# Patient Record
Sex: Female | Born: 1955 | Race: White | Hispanic: No | Marital: Single | Smoking: Former smoker
Health system: Southern US, Community
[De-identification: ages and names within clinical notes are randomized; demographics above are authoritative.]

## PROBLEM LIST (undated history)

## (undated) DIAGNOSIS — T8859XA Other complications of anesthesia, initial encounter: Secondary | ICD-10-CM

## (undated) DIAGNOSIS — G709 Myoneural disorder, unspecified: Secondary | ICD-10-CM

## (undated) DIAGNOSIS — D649 Anemia, unspecified: Secondary | ICD-10-CM

## (undated) DIAGNOSIS — I10 Essential (primary) hypertension: Secondary | ICD-10-CM

## (undated) DIAGNOSIS — Z973 Presence of spectacles and contact lenses: Secondary | ICD-10-CM

## (undated) DIAGNOSIS — N189 Chronic kidney disease, unspecified: Secondary | ICD-10-CM

## (undated) DIAGNOSIS — R350 Frequency of micturition: Secondary | ICD-10-CM

## (undated) DIAGNOSIS — Z8719 Personal history of other diseases of the digestive system: Secondary | ICD-10-CM

## (undated) DIAGNOSIS — N390 Urinary tract infection, site not specified: Secondary | ICD-10-CM

## (undated) DIAGNOSIS — M48061 Spinal stenosis, lumbar region without neurogenic claudication: Secondary | ICD-10-CM

## (undated) DIAGNOSIS — G35 Multiple sclerosis: Secondary | ICD-10-CM

## (undated) DIAGNOSIS — J45909 Unspecified asthma, uncomplicated: Secondary | ICD-10-CM

## (undated) DIAGNOSIS — K219 Gastro-esophageal reflux disease without esophagitis: Secondary | ICD-10-CM

## (undated) DIAGNOSIS — M4306 Spondylolysis, lumbar region: Secondary | ICD-10-CM

## (undated) DIAGNOSIS — M1990 Unspecified osteoarthritis, unspecified site: Secondary | ICD-10-CM

## (undated) DIAGNOSIS — J849 Interstitial pulmonary disease, unspecified: Secondary | ICD-10-CM

## (undated) DIAGNOSIS — F419 Anxiety disorder, unspecified: Secondary | ICD-10-CM

## (undated) DIAGNOSIS — R011 Cardiac murmur, unspecified: Secondary | ICD-10-CM

## (undated) DIAGNOSIS — R51 Headache: Secondary | ICD-10-CM

## (undated) DIAGNOSIS — G35D Multiple sclerosis, unspecified: Secondary | ICD-10-CM

## (undated) DIAGNOSIS — T4145XA Adverse effect of unspecified anesthetic, initial encounter: Secondary | ICD-10-CM

## (undated) HISTORY — PX: CHOLECYSTECTOMY: SHX55

## (undated) HISTORY — PX: TONSILLECTOMY: SUR1361

## (undated) HISTORY — DX: Multiple sclerosis, unspecified: G35.D

## (undated) HISTORY — PX: EYE SURGERY: SHX253

## (undated) HISTORY — PX: COLONOSCOPY: SHX174

## (undated) HISTORY — DX: Multiple sclerosis: G35

## (undated) HISTORY — PX: UTERINE FIBROID EMBOLIZATION: SHX825

---

## 1997-08-24 ENCOUNTER — Ambulatory Visit (HOSPITAL_COMMUNITY): Admission: RE | Admit: 1997-08-24 | Discharge: 1997-08-24 | Payer: Self-pay | Admitting: *Deleted

## 1998-01-29 ENCOUNTER — Ambulatory Visit (HOSPITAL_COMMUNITY): Admission: RE | Admit: 1998-01-29 | Discharge: 1998-01-29 | Payer: Self-pay | Admitting: *Deleted

## 1998-10-24 ENCOUNTER — Ambulatory Visit (HOSPITAL_COMMUNITY): Admission: RE | Admit: 1998-10-24 | Discharge: 1998-10-24 | Payer: Self-pay | Admitting: *Deleted

## 1999-01-03 ENCOUNTER — Encounter (INDEPENDENT_AMBULATORY_CARE_PROVIDER_SITE_OTHER): Payer: Self-pay

## 1999-01-03 ENCOUNTER — Other Ambulatory Visit: Admission: RE | Admit: 1999-01-03 | Discharge: 1999-01-03 | Payer: Self-pay | Admitting: Obstetrics and Gynecology

## 1999-02-08 ENCOUNTER — Ambulatory Visit (HOSPITAL_COMMUNITY): Admission: RE | Admit: 1999-02-08 | Discharge: 1999-02-08 | Payer: Self-pay | Admitting: *Deleted

## 1999-03-13 ENCOUNTER — Encounter: Admission: RE | Admit: 1999-03-13 | Discharge: 1999-03-13 | Payer: Self-pay | Admitting: *Deleted

## 1999-04-11 ENCOUNTER — Encounter: Admission: RE | Admit: 1999-04-11 | Discharge: 1999-04-11 | Payer: Self-pay | Admitting: *Deleted

## 1999-05-30 ENCOUNTER — Ambulatory Visit (HOSPITAL_COMMUNITY): Admission: RE | Admit: 1999-05-30 | Discharge: 1999-05-30 | Payer: Self-pay | Admitting: Obstetrics and Gynecology

## 1999-05-30 ENCOUNTER — Encounter: Payer: Self-pay | Admitting: Obstetrics and Gynecology

## 1999-06-05 ENCOUNTER — Encounter: Admission: RE | Admit: 1999-06-05 | Discharge: 1999-09-03 | Payer: Self-pay | Admitting: *Deleted

## 1999-06-25 ENCOUNTER — Ambulatory Visit (HOSPITAL_COMMUNITY): Admission: RE | Admit: 1999-06-25 | Discharge: 1999-06-25 | Payer: Self-pay | Admitting: Interventional Radiology

## 1999-06-27 ENCOUNTER — Encounter: Payer: Self-pay | Admitting: Obstetrics and Gynecology

## 1999-06-27 ENCOUNTER — Observation Stay (HOSPITAL_COMMUNITY): Admission: AD | Admit: 1999-06-27 | Discharge: 1999-06-28 | Payer: Self-pay | Admitting: Obstetrics and Gynecology

## 1999-10-07 ENCOUNTER — Encounter: Admission: RE | Admit: 1999-10-07 | Discharge: 2000-01-05 | Payer: Self-pay | Admitting: Internal Medicine

## 2000-01-02 ENCOUNTER — Ambulatory Visit (HOSPITAL_COMMUNITY): Admission: RE | Admit: 2000-01-02 | Discharge: 2000-01-02 | Payer: Self-pay | Admitting: Interventional Radiology

## 2000-02-12 ENCOUNTER — Encounter: Payer: Self-pay | Admitting: Obstetrics and Gynecology

## 2000-02-12 ENCOUNTER — Ambulatory Visit (HOSPITAL_COMMUNITY): Admission: RE | Admit: 2000-02-12 | Discharge: 2000-02-12 | Payer: Self-pay | Admitting: Obstetrics and Gynecology

## 2000-05-12 ENCOUNTER — Ambulatory Visit (HOSPITAL_COMMUNITY): Admission: RE | Admit: 2000-05-12 | Discharge: 2000-05-12 | Payer: Self-pay | Admitting: Gastroenterology

## 2000-05-12 ENCOUNTER — Encounter (INDEPENDENT_AMBULATORY_CARE_PROVIDER_SITE_OTHER): Payer: Self-pay | Admitting: *Deleted

## 2000-05-26 ENCOUNTER — Encounter: Admission: RE | Admit: 2000-05-26 | Discharge: 2000-05-26 | Payer: Self-pay | Admitting: Gastroenterology

## 2000-05-26 ENCOUNTER — Encounter: Payer: Self-pay | Admitting: Gastroenterology

## 2001-01-11 ENCOUNTER — Other Ambulatory Visit: Admission: RE | Admit: 2001-01-11 | Discharge: 2001-01-11 | Payer: Self-pay | Admitting: Obstetrics and Gynecology

## 2001-01-20 ENCOUNTER — Encounter: Payer: Self-pay | Admitting: Obstetrics and Gynecology

## 2001-01-20 ENCOUNTER — Ambulatory Visit (HOSPITAL_COMMUNITY): Admission: RE | Admit: 2001-01-20 | Discharge: 2001-01-20 | Payer: Self-pay | Admitting: Obstetrics and Gynecology

## 2001-03-09 ENCOUNTER — Encounter: Payer: Self-pay | Admitting: Obstetrics and Gynecology

## 2001-03-09 ENCOUNTER — Ambulatory Visit (HOSPITAL_COMMUNITY): Admission: RE | Admit: 2001-03-09 | Discharge: 2001-03-09 | Payer: Self-pay | Admitting: Obstetrics and Gynecology

## 2001-04-20 ENCOUNTER — Encounter: Admission: RE | Admit: 2001-04-20 | Discharge: 2001-04-20 | Payer: Self-pay | Admitting: Obstetrics and Gynecology

## 2001-05-03 ENCOUNTER — Encounter: Admission: RE | Admit: 2001-05-03 | Discharge: 2001-05-03 | Payer: Self-pay | Admitting: Obstetrics and Gynecology

## 2001-05-03 ENCOUNTER — Encounter: Payer: Self-pay | Admitting: Obstetrics and Gynecology

## 2001-06-23 ENCOUNTER — Encounter: Admission: RE | Admit: 2001-06-23 | Discharge: 2001-06-23 | Payer: Self-pay | Admitting: Obstetrics and Gynecology

## 2001-06-23 ENCOUNTER — Encounter: Payer: Self-pay | Admitting: Obstetrics and Gynecology

## 2002-01-12 ENCOUNTER — Encounter (INDEPENDENT_AMBULATORY_CARE_PROVIDER_SITE_OTHER): Payer: Self-pay

## 2002-01-12 ENCOUNTER — Ambulatory Visit (HOSPITAL_COMMUNITY): Admission: RE | Admit: 2002-01-12 | Discharge: 2002-01-12 | Payer: Self-pay | Admitting: Obstetrics and Gynecology

## 2002-03-15 ENCOUNTER — Ambulatory Visit (HOSPITAL_COMMUNITY): Admission: RE | Admit: 2002-03-15 | Discharge: 2002-03-15 | Payer: Self-pay | Admitting: Obstetrics and Gynecology

## 2002-03-15 ENCOUNTER — Other Ambulatory Visit: Admission: RE | Admit: 2002-03-15 | Discharge: 2002-03-15 | Payer: Self-pay | Admitting: Obstetrics and Gynecology

## 2002-03-15 ENCOUNTER — Encounter: Payer: Self-pay | Admitting: Obstetrics and Gynecology

## 2002-04-15 ENCOUNTER — Encounter: Payer: Self-pay | Admitting: General Surgery

## 2002-04-15 ENCOUNTER — Ambulatory Visit (HOSPITAL_COMMUNITY): Admission: RE | Admit: 2002-04-15 | Discharge: 2002-04-15 | Payer: Self-pay | Admitting: General Surgery

## 2002-06-13 ENCOUNTER — Encounter: Payer: Self-pay | Admitting: General Surgery

## 2002-06-13 ENCOUNTER — Observation Stay (HOSPITAL_COMMUNITY): Admission: RE | Admit: 2002-06-13 | Discharge: 2002-06-14 | Payer: Self-pay | Admitting: General Surgery

## 2002-06-13 ENCOUNTER — Encounter (INDEPENDENT_AMBULATORY_CARE_PROVIDER_SITE_OTHER): Payer: Self-pay | Admitting: Specialist

## 2002-06-19 ENCOUNTER — Emergency Department (HOSPITAL_COMMUNITY): Admission: EM | Admit: 2002-06-19 | Discharge: 2002-06-19 | Payer: Self-pay | Admitting: Emergency Medicine

## 2003-01-27 ENCOUNTER — Ambulatory Visit (HOSPITAL_COMMUNITY): Admission: RE | Admit: 2003-01-27 | Discharge: 2003-01-27 | Payer: Self-pay | Admitting: Internal Medicine

## 2003-02-07 ENCOUNTER — Encounter: Admission: RE | Admit: 2003-02-07 | Discharge: 2003-02-07 | Payer: Self-pay | Admitting: Internal Medicine

## 2003-04-27 ENCOUNTER — Other Ambulatory Visit: Admission: RE | Admit: 2003-04-27 | Discharge: 2003-04-27 | Payer: Self-pay | Admitting: Obstetrics and Gynecology

## 2003-05-03 ENCOUNTER — Ambulatory Visit (HOSPITAL_COMMUNITY): Admission: RE | Admit: 2003-05-03 | Discharge: 2003-05-03 | Payer: Self-pay | Admitting: Obstetrics and Gynecology

## 2004-06-13 ENCOUNTER — Ambulatory Visit (HOSPITAL_COMMUNITY): Admission: RE | Admit: 2004-06-13 | Discharge: 2004-06-13 | Payer: Self-pay | Admitting: Obstetrics and Gynecology

## 2004-08-12 ENCOUNTER — Other Ambulatory Visit: Admission: RE | Admit: 2004-08-12 | Discharge: 2004-08-12 | Payer: Self-pay | Admitting: Obstetrics and Gynecology

## 2005-05-27 ENCOUNTER — Emergency Department (HOSPITAL_COMMUNITY): Admission: EM | Admit: 2005-05-27 | Discharge: 2005-05-27 | Payer: Self-pay | Admitting: Emergency Medicine

## 2005-06-16 ENCOUNTER — Ambulatory Visit (HOSPITAL_COMMUNITY): Admission: RE | Admit: 2005-06-16 | Discharge: 2005-06-16 | Payer: Self-pay | Admitting: Obstetrics and Gynecology

## 2005-07-30 ENCOUNTER — Ambulatory Visit (HOSPITAL_BASED_OUTPATIENT_CLINIC_OR_DEPARTMENT_OTHER): Admission: RE | Admit: 2005-07-30 | Discharge: 2005-07-30 | Payer: Self-pay | Admitting: Orthopedic Surgery

## 2006-01-28 ENCOUNTER — Other Ambulatory Visit: Admission: RE | Admit: 2006-01-28 | Discharge: 2006-01-28 | Payer: Self-pay | Admitting: Obstetrics and Gynecology

## 2006-03-10 ENCOUNTER — Ambulatory Visit (HOSPITAL_BASED_OUTPATIENT_CLINIC_OR_DEPARTMENT_OTHER): Admission: RE | Admit: 2006-03-10 | Discharge: 2006-03-11 | Payer: Self-pay | Admitting: Orthopedic Surgery

## 2006-09-02 ENCOUNTER — Ambulatory Visit (HOSPITAL_COMMUNITY): Admission: RE | Admit: 2006-09-02 | Discharge: 2006-09-02 | Payer: Self-pay | Admitting: Obstetrics and Gynecology

## 2007-09-06 IMAGING — CR DG ANKLE COMPLETE 3+V*R*
4 series · 4 of 4 positions shown · non-contrast
Comparison: None.

CLINICAL DATA: Fell yesterday, persistent right ankle pain and swelling.

RIGHT ANKLE - 3 VIEW  05/27/2005:

[t ankle joint ap right]
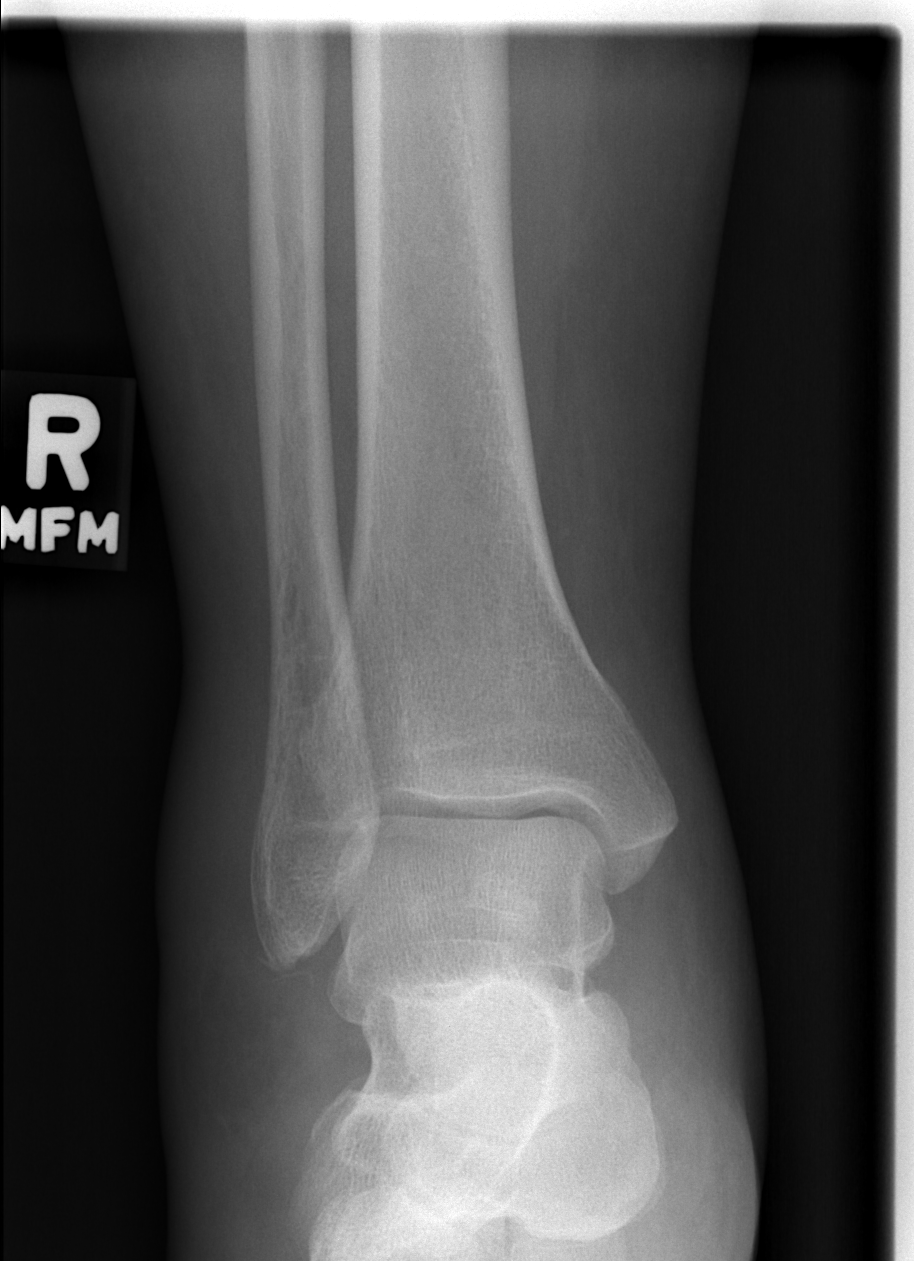

[t ankle joint oblique right]
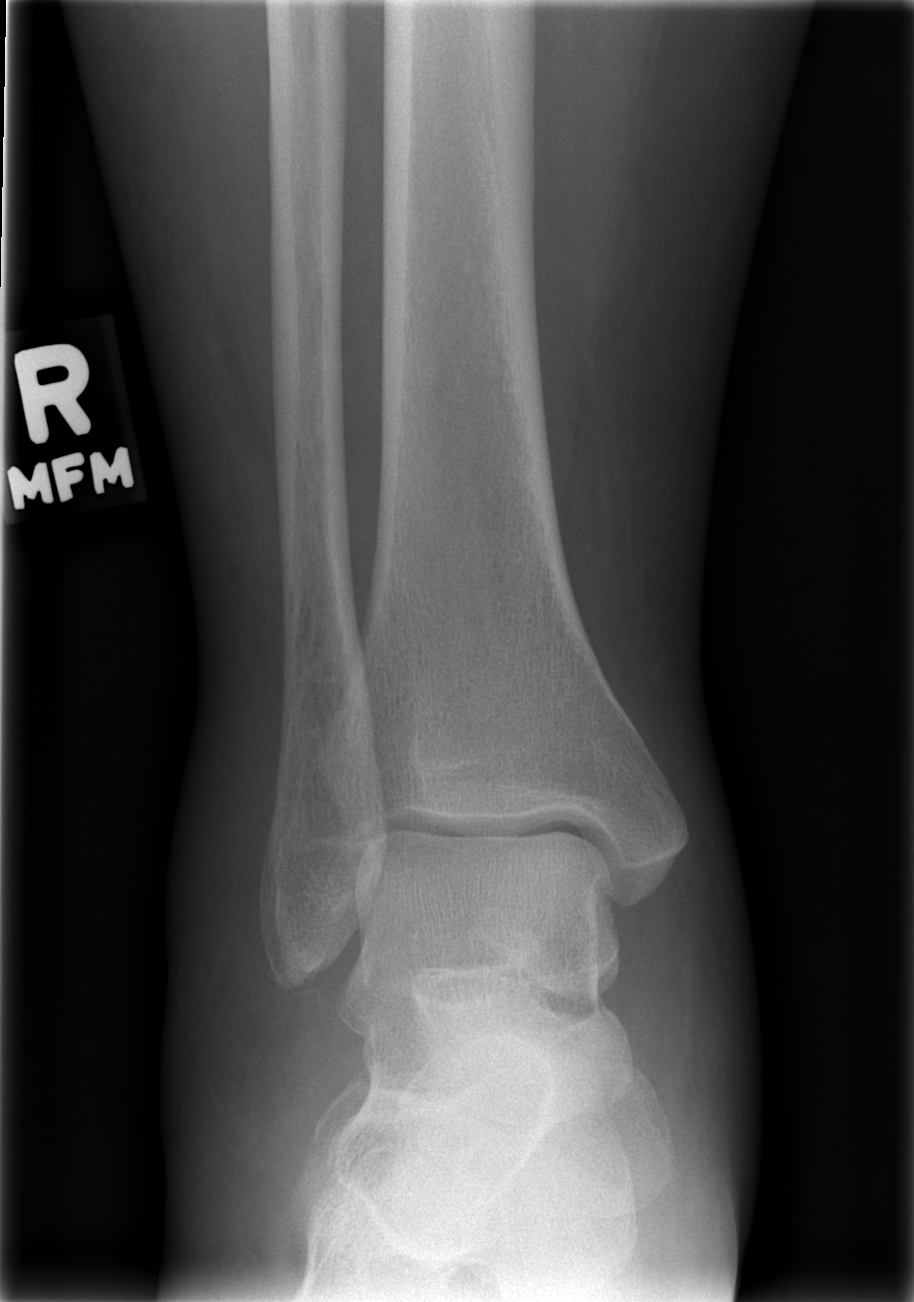

[t ankle joint lat right (1 of 2)]
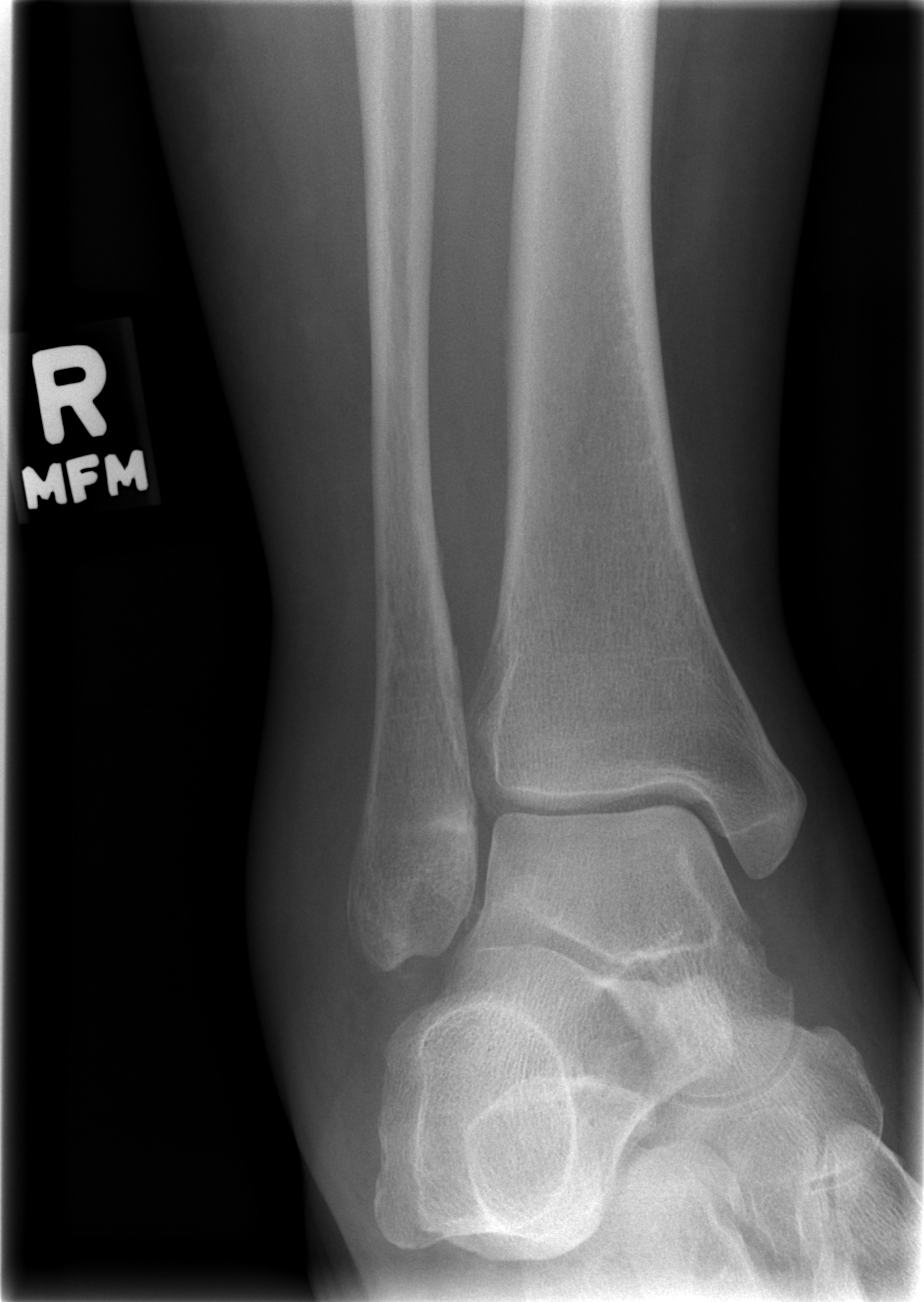

[t ankle joint lat right (2 of 2)]
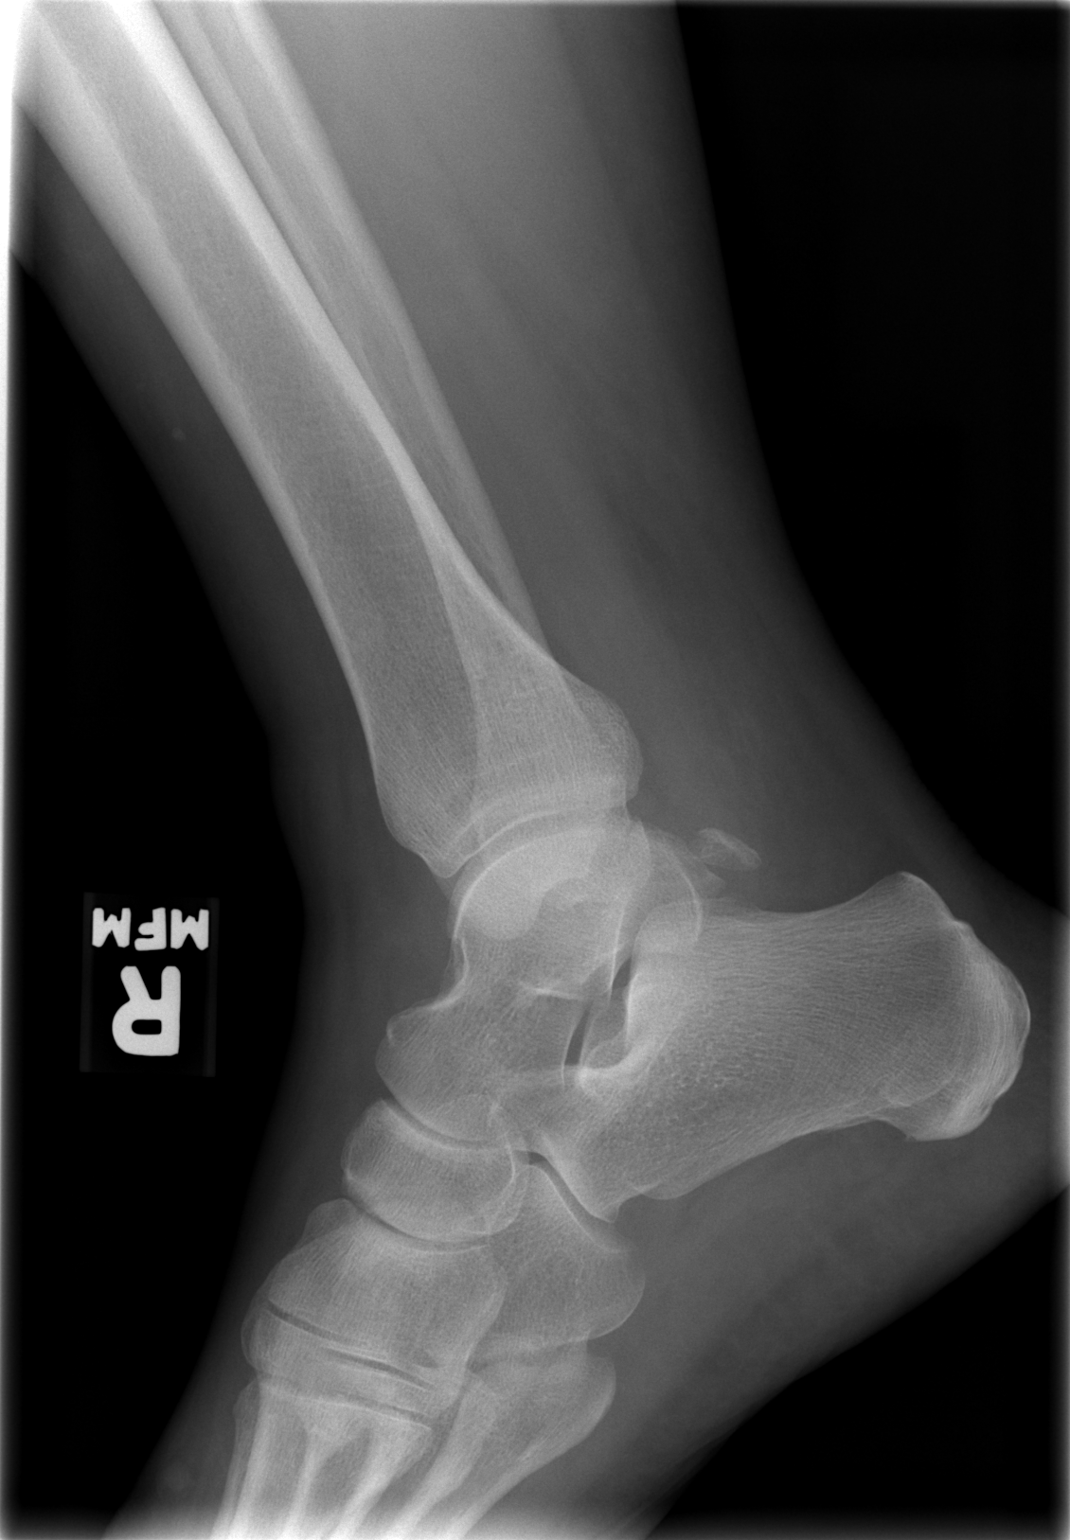

[4 of 4 positions shown; findings below may reference images not displayed]

FINDINGS: Diffuse soft tissue swelling is present. There is a linear calcific
focus inferior to the tip of the lateral malleolus which may represent a tiny
avulsion fracture. No other fractures are identified. The ankle mortise is
intact. An accessory ossicle, the os trigonum, is noted posterior to the talus;
I do not believe this represents an avulsion fracture (clinical correlation as
to point tenderness). Note is made of a phlebolith in the subcutaneous tissues
of the anterior lower leg.
IMPRESSION: 1. Possible tiny avulsion fracture involving the tip of lateral malleolus. No
other fractures are identified.
2. Diffuse soft tissue swelling.
3. Accessory ossicle posterior to the talus, the os trigonum. I do not believe
this represents a fracture of the talus. Clinical correlation.

## 2007-09-15 ENCOUNTER — Ambulatory Visit (HOSPITAL_COMMUNITY): Admission: RE | Admit: 2007-09-15 | Discharge: 2007-09-15 | Payer: Self-pay | Admitting: Obstetrics and Gynecology

## 2007-09-23 ENCOUNTER — Ambulatory Visit (HOSPITAL_COMMUNITY): Admission: RE | Admit: 2007-09-23 | Discharge: 2007-09-23 | Payer: Self-pay | Admitting: Ophthalmology

## 2007-09-27 ENCOUNTER — Other Ambulatory Visit: Admission: RE | Admit: 2007-09-27 | Discharge: 2007-09-27 | Payer: Self-pay | Admitting: Obstetrics and Gynecology

## 2007-12-11 ENCOUNTER — Emergency Department (HOSPITAL_COMMUNITY): Admission: EM | Admit: 2007-12-11 | Discharge: 2007-12-11 | Payer: Self-pay | Admitting: Emergency Medicine

## 2008-04-06 ENCOUNTER — Encounter: Payer: Self-pay | Admitting: Family Medicine

## 2008-04-06 ENCOUNTER — Observation Stay (HOSPITAL_COMMUNITY): Admission: AD | Admit: 2008-04-06 | Discharge: 2008-04-07 | Payer: Self-pay | Admitting: Internal Medicine

## 2008-04-13 ENCOUNTER — Emergency Department (HOSPITAL_COMMUNITY): Admission: EM | Admit: 2008-04-13 | Discharge: 2008-04-13 | Payer: Self-pay | Admitting: Emergency Medicine

## 2008-05-22 ENCOUNTER — Emergency Department (HOSPITAL_COMMUNITY): Admission: EM | Admit: 2008-05-22 | Discharge: 2008-05-22 | Payer: Self-pay | Admitting: Emergency Medicine

## 2008-11-02 ENCOUNTER — Ambulatory Visit (HOSPITAL_COMMUNITY): Admission: RE | Admit: 2008-11-02 | Discharge: 2008-11-02 | Payer: Self-pay | Admitting: Obstetrics and Gynecology

## 2008-12-26 ENCOUNTER — Other Ambulatory Visit: Admission: RE | Admit: 2008-12-26 | Discharge: 2008-12-26 | Payer: Self-pay | Admitting: Obstetrics and Gynecology

## 2009-11-19 ENCOUNTER — Ambulatory Visit (HOSPITAL_COMMUNITY): Admission: RE | Admit: 2009-11-19 | Discharge: 2009-11-19 | Payer: Self-pay | Admitting: Obstetrics and Gynecology

## 2010-02-26 ENCOUNTER — Other Ambulatory Visit
Admission: RE | Admit: 2010-02-26 | Discharge: 2010-02-26 | Payer: Self-pay | Source: Home / Self Care | Admitting: Obstetrics and Gynecology

## 2010-04-14 ENCOUNTER — Encounter: Payer: Self-pay | Admitting: Obstetrics and Gynecology

## 2010-04-15 ENCOUNTER — Encounter: Payer: Self-pay | Admitting: Family Medicine

## 2010-07-04 LAB — URINALYSIS, ROUTINE W REFLEX MICROSCOPIC
Bilirubin Urine: NEGATIVE
Glucose, UA: NEGATIVE mg/dL
Hgb urine dipstick: NEGATIVE
Ketones, ur: NEGATIVE mg/dL
Nitrite: NEGATIVE
Protein, ur: NEGATIVE mg/dL
Specific Gravity, Urine: 1.01 (ref 1.005–1.030)
Urobilinogen, UA: 0.2 mg/dL (ref 0.0–1.0)
pH: 8.5 — ABNORMAL HIGH (ref 5.0–8.0)

## 2010-07-04 LAB — CBC
MCHC: 35 g/dL (ref 30.0–36.0)
MCV: 84.3 fL (ref 78.0–100.0)
Platelets: 178 10*3/uL (ref 150–400)
RDW: 14.7 % (ref 11.5–15.5)

## 2010-07-04 LAB — POCT I-STAT, CHEM 8
Glucose, Bld: 96 mg/dL (ref 70–99)
HCT: 38 % (ref 36.0–46.0)
Hemoglobin: 12.9 g/dL (ref 12.0–15.0)
Potassium: 3.4 mEq/L — ABNORMAL LOW (ref 3.5–5.1)

## 2010-07-04 LAB — RAPID URINE DRUG SCREEN, HOSP PERFORMED
Amphetamines: NOT DETECTED
Barbiturates: NOT DETECTED
Benzodiazepines: NOT DETECTED
Cocaine: NOT DETECTED
Opiates: NOT DETECTED

## 2010-07-04 LAB — DIFFERENTIAL
Basophils Relative: 0 % (ref 0–1)
Eosinophils Absolute: 0 10*3/uL (ref 0.0–0.7)
Monocytes Relative: 6 % (ref 3–12)
Neutrophils Relative %: 73 % (ref 43–77)

## 2010-07-04 LAB — POCT CARDIAC MARKERS: CKMB, poc: <1 ng/mL — ABNORMAL LOW (ref 1.0–8.0)

## 2010-07-04 LAB — URINE MICROSCOPIC-ADD ON

## 2010-07-08 LAB — MAGNESIUM: Magnesium: 2 mg/dL (ref 1.5–2.5)

## 2010-07-08 LAB — DIFFERENTIAL
Eosinophils Relative: 0 % (ref 0–5)
Lymphocytes Relative: 19 % (ref 12–46)
Lymphocytes Relative: 22 % (ref 12–46)
Lymphs Abs: 1.2 10*3/uL (ref 0.7–4.0)
Lymphs Abs: 1.4 10*3/uL (ref 0.7–4.0)
Monocytes Relative: 9 % (ref 3–12)
Neutro Abs: 3.6 10*3/uL (ref 1.7–7.7)
Neutro Abs: 5.1 10*3/uL (ref 1.7–7.7)
Neutrophils Relative %: 72 % (ref 43–77)

## 2010-07-08 LAB — URINALYSIS, ROUTINE W REFLEX MICROSCOPIC
Bilirubin Urine: NEGATIVE
Glucose, UA: NEGATIVE mg/dL
Hgb urine dipstick: NEGATIVE
Ketones, ur: NEGATIVE mg/dL
Nitrite: NEGATIVE
Protein, ur: NEGATIVE mg/dL
Specific Gravity, Urine: 1.011 (ref 1.005–1.030)
Urobilinogen, UA: 0.2 mg/dL (ref 0.0–1.0)
pH: 8 (ref 5.0–8.0)

## 2010-07-08 LAB — POCT I-STAT, CHEM 8
BUN: 12 mg/dL (ref 6–23)
Calcium, Ion: 1.22 mmol/L (ref 1.12–1.32)
Chloride: 102 mEq/L (ref 96–112)
Creatinine, Ser: 0.9 mg/dL (ref 0.4–1.2)
Glucose, Bld: 99 mg/dL (ref 70–99)
HCT: 38 % (ref 36.0–46.0)
Hemoglobin: 12.9 g/dL (ref 12.0–15.0)
Potassium: 4.2 mEq/L (ref 3.5–5.1)
Sodium: 140 meq/L (ref 135–145)
TCO2: 30 mmol/L (ref 0–100)

## 2010-07-08 LAB — CBC
HCT: 33.6 % — ABNORMAL LOW (ref 36.0–46.0)
HCT: 36 % (ref 36.0–46.0)
Hemoglobin: 11.4 g/dL — ABNORMAL LOW (ref 12.0–15.0)
Hemoglobin: 12 g/dL (ref 12.0–15.0)
MCHC: 33.3 g/dL (ref 30.0–36.0)
Platelets: 193 10*3/uL (ref 150–400)
RDW: 14.2 % (ref 11.5–15.5)
WBC: 5.4 10*3/uL (ref 4.0–10.5)

## 2010-07-08 LAB — BASIC METABOLIC PANEL
Calcium: 9.5 mg/dL (ref 8.4–10.5)
GFR calc non Af Amer: 59 mL/min — ABNORMAL LOW (ref >60)
Potassium: 3.3 mEq/L — ABNORMAL LOW (ref 3.5–5.1)
Sodium: 141 mEq/L (ref 135–145)

## 2010-07-08 LAB — COMPREHENSIVE METABOLIC PANEL
BUN: 25 mg/dL — ABNORMAL HIGH (ref 6–23)
Calcium: 10.5 mg/dL (ref 8.4–10.5)
Creatinine, Ser: 1.07 mg/dL (ref 0.4–1.2)
Glucose, Bld: 101 mg/dL — ABNORMAL HIGH (ref 70–99)
Sodium: 142 mEq/L (ref 135–145)
Total Protein: 6.7 g/dL (ref 6.0–8.3)

## 2010-07-08 LAB — APTT: aPTT: 24 seconds (ref 24–37)

## 2010-07-08 LAB — PROTIME-INR: INR: 1 (ref 0.00–1.49)

## 2010-08-06 NOTE — Op Note (Signed)
[name], [name]                  ACCOUNT NO.:  [id number]   MEDICAL RECORD NO.:  [id number]          PATIENT TYPE:  AMB   LOCATION:  SDS                          FACILITY:  [facility]   PHYSICIAN:  Robert L. Dione Booze, M.D.  DATE OF BIRTH:  [birth year]   DATE OF PROCEDURE:  09/23/2007  DATE OF DISCHARGE:                               OPERATIVE REPORT   INDICATIONS AND JUSTIFICATIONS FOR PROCEDURE:  This 55 year old lady was  seen through the referral of Dr. Charlann Noss regarding upper eyelid  optical blepharoplasties.  She feels that the superior field of vision  is reduced from the redundant skin of her upper eyelids.  Permission was  obtained from her insurance to perform this procedure.  She refracts to  20/25 in each eye and the visual field does show some superior reduction  from the skin of the lids.  Pressures are 18 in the right eye and 19 in  the left.  The pupil's motility, conjunctiva, cornea, lens, anterior  chamber, and dilated fundus examination are normal, and she does have  redundant skin of each upper eyelid.  This was discussed and she decided  to have upper eyelid blepharoplasties.  Medically, she should be stable.   JUSTIFICATION FOR PERFORMING PROCEDURE IN AN OUTPATIENT SETTING:  Routine.   JUSTIFICATION FOR OVERNIGHT STAY:  None.   PREOPERATIVE DIAGNOSIS:  Severe dermatochalasis with visual impairment.   POSTOPERATIVE DIAGNOSIS:  A severe dermatochalasis with visual  impairment.   OPERATION PERFORMED:  Upper eyelid blepharoplasty.   SURGEON:  Robert L. Groat, MD.   ANESTHESIA:  Xylocaine 1%  with epinephrine.   PROCEDURE:  The patient was brought to the minor surgery room and was  prepped and draped.  The skin to be removed was carefully demarcated and  then excised after 1% Xylocaine with epinephrine had been given to each  lid.  Some underlying subcutaneous and fatty tissue was removed and  bleeding was controlled with a combination of  cautery and pressure.  Each wound was closed with a running 6-0 nylon suture and some  Polysporin ointment and cool compresses were applied.  The patient then  left the minor surgery room having done well.   LEVEL OF CARE:  The patient is to be seen in my office in 5 or 6 days  till the sutures are removed.  She is to use cold compresses today and  warm compresses starting tomorrow.  If there is any bleeding, she is to  use firm pressure for several minutes at a time, and she is to keep her  head somewhat elevated for the rest of the day.           ______________________________  Doris Cheadle. Dione Booze, M.D.     RLG/MEDQ  D:  09/23/2007  T:  09/24/2007  Job:  [id number]

## 2010-08-06 NOTE — H&P (Signed)
[name], [name] NO.:  [id number]   MEDICAL RECORD NO.:  [id number]          PATIENT TYPE:  INP   LOCATION:  3021                         FACILITY:  [facility]   PHYSICIAN:  Thora Lance, M.D.  DATE OF BIRTH:  04-26-1955   DATE OF ADMISSION:  04/06/2008  DATE OF DISCHARGE:                              HISTORY & PHYSICAL   CHIEF COMPLAINT:  Fall and head injury.   HISTORY OF PRESENT ILLNESS:  This is a 55 year old white female who  presents after falling last night and hitting her head.  Last night  while home alone in her kitchen she tripped and slipped on a rug and  fell, hitting the front of her head.  She then tried to turn over on the  floor and hit the back of her head.  The patient was eventually able to  get  up.  She went to see her primary physician, Dr. Merri Brunette this  morning and was sent to the hospital where a CAT scan of the brain  showed a small subdural hematoma.  She was admitted for observation.  She denies any severe headache, although she does have some soreness at  the sites of her trauma.  She denies nausea, vomiting, visual changes,  weakness or numbness, and arm or leg change from her usual mild right-  sided weakness secondary to her MS.   PAST MEDICAL HISTORY:   ALLERGIES:  No known drug allergies.   CURRENT MEDICATIONS:  1. Betaseron, unknown dose, subcu every other day.  2. Hydrochlorothiazide, unknown dose, daily.  3. Skelaxin 800 mg one half t.i.d.  4. Tramadol 50 mg at bedtime.  5. Vesicare 10 mg daily.  6. Sertraline, unknown dose, 1-1/2 daily.   Medical:  1. Hypertension.  2. Multiple sclerosis, Dr. Avie Echevaria.  3. Hyperlipidemia.  4. History of headaches.  5. Depression.   PAST SURGICAL HISTORY:  1. Blepharoplasty, July 2009.  2/  Left foot surgery, December 2007.  1. Left carpal tunnel release, May 2007.  2. Laparoscopic cholecystectomy, March 2004.  3. D and C, October 2003.   FAMILY HISTORY:  Father:   Unknown.  Mother:  Hypertension.  Siblings:  Good health.   SOCIAL HISTORY:  She lives alone.  She does not smoke or drink alcohol.   REVIEW OF SYSTEMS:  Reviewed and otherwise negative.   PHYSICAL EXAMINATION:  GENERAL:  Mildly overweight white female.  In  general she is no distress.  She does have ecchymosis around both eyes.  VITAL SIGNS:  Temperature 98.9, heart rate 63, respirations 21, blood  pressure 157/80, oxygen saturation 95% on room air.  HEENT:  Head:  There is a large left frontal scalp hematoma and an  occipital scalp hematoma.  Ecchymosis around both eyes.  Eyes:  Pupils  equal, regular, and respond to light.  Extraocular movements are intact.  Funduscopic normal.  Ears:  Right EAC and TM clear.  Left EAC blocked by  cerumen.  Oral cavity/oropharynx clear.  NECK:  Supple.  No lymphadenopathy or bruits.  LUNGS:  Clear.  HEART:  Regular rate and rhythm without murmur, gallop, or rub.  ABDOMEN:  Soft, nontender, no masses or hepatosplenomegaly.  EXTREMITIES:  No edema.  NEUROLOGIC:  Alert, oriented x3.  Cranial nerves II-XII are intact.  Strength is 5/5 in the left arm and leg, 5-/5 right leg, 5+/5 right arm.  Sensory grossly intact.  Reflexes 2/4 throughout, toes downgoing.  Gait  was not tested.   LABORATORY DATA:  PTT is 24, PT 13.2.  CBC:  WBC 7.1, hemoglobin 12,  platelet count 239.  A complete metabolic profile and urinalysis are  pending.   Chest x-ray showed no acute disease.  An EKG is pending.  CT scan of the  brain shows a small interhemispheric subdural hematoma without mass  effect.  There is fairly extensive periventricular white matter disease  and frontal occipital scalp hematomas noted.   ASSESSMENT:  1. Small subdural hematoma without mass effect or neurologic changes.  2. Multiple sclerosis.  History of gait disorder with falls.  3. Hypertension.  4. History of headaches.  5. History of depression.   PLAN:  Admit to 3000 for observation.   Neuro checks two hours.  Repeat  CT scan of the brain in the a.m. to ensure stability.  PT consult  tomorrow.  Home medications.  IV fluids and full liquids.  Analgesia.  If CT scan is stable the patient is neurologically stable tomorrow,  could probably be discharged to home after being seen by physical  therapy.  This plan was discussed with Dr. Dutch Quint of neurosurgery  service.           ______________________________  Thora Lance, M.D.     JJG/MEDQ  D:  04/06/2008  T:  04/06/2008  Job:  [id number]   cc:   Dario Guardian, M.D.

## 2010-08-06 NOTE — Discharge Summary (Signed)
[name], [name] NO.:  [id number]   MEDICAL RECORD NO.:  [id number]          PATIENT TYPE:  EMS   LOCATION:  MAJO                         FACILITY:  [facility]   PHYSICIAN:  Ramiro Harvest, MD    DATE OF BIRTH:  1955/06/07   DATE OF ADMISSION:  05/22/2008  DATE OF DISCHARGE:  05/22/2008                               DISCHARGE SUMMARY   PRIMARY CARE PHYSICIAN:  Dario Guardian, M.D.   HISTORY OF PRESENT ILLNESS:  [name] is a 55 year old white female  with history of multiple sclerosis, hypertension, hyperlipidemia, small  subdural hematoma April 06, 2008 who presented to the ED with  confusion.  Per the patient the night before admission she experienced  some nausea and some chills but no emesis.  On the morning of admission  the patient could not remember her sister's number, could not remember  how to turn on the television.  Called a neighbor who came in and called  her sister and eventually called 9-1-1 and the patient was brought to  the ED.  In the ED the patient's confusion had resolved.  The patient  was back to baseline. At time of the interview the patient denied any  fever, chest pain or shortness of breath, no emesis.  No abdominal pain,  no diarrhea, no constipation, no cough, no focal neurological symptoms  or change in the chronic weakness.  No facial droop or slurred speech.  The patient was back at her baseline per her family.  The patient very  insistent on going home.  Does not want to be admitted even for  observation.  The patient is adamant on being discharged home.   ALLERGIES:  No known drug allergies.   PAST MEDICAL HISTORY:  1. Hypertension.  2. Multiple sclerosis followed by Dr. Avie Echevaria.  3. Hyperlipidemia.  4. History of headaches.  5. Depression.  6. History of fibroid uterus.  7. Subdural hematoma April 06, 2008.  8. Gastroesophageal reflux disease.  9. Small hiatal hernia.  10.Blepharoplasty July 2009.  11.Left  foot surgery December 2007.  12.Left carpal tunnel release May 2007.  13.Status post laparoscopic  cholecystectomy March 2004.  14.D&C in October 2003.   SOCIAL HISTORY:  The patient lives alone.  No tobacco use.  No alcohol  use.  No IV drug use.   FAMILY HISTORY:  Noncontributory.   HOME MEDICATIONS:  1. Betaseron 0.3 mg subcu every other day.  2. Skelaxin 400 mg p.o. b.i.d.  3. Tramadol 50 mg q.i.d.  4. Vesicare 10 mg p.o. daily.  5. Sertraline 150 mg 1-1/2 tablets daily.  6. Baclofen 20 mg q.i.d.  7. Caduet 5/10 p.o. daily.  8. Gabapentin 200 mg t.i.d.  9. Lisinopril/HCTZ 20/25 mg p.o. daily.  10.Nexium 40 mg p.o. daily.  11.Pepto-Bismol as needed.   REVIEW OF SYSTEMS:  Review of systems as per HPI, otherwise negative.   PHYSICAL EXAMINATION:  VITAL SIGNS:  Temperature 98.7, blood pressure  168/91, pulse of 67, respirations 16, oxygen saturation 96% on room air.  GENERAL:  The patient is in no  apparent distress.  HEENT: Normocephalic, atraumatic.  Pupils equal, round and reactive to  light and accommodation.  Extraocular movements intact.  Oropharynx is  clear.  No lesions.  No exudates.  NECK:  Supple.  No lymphadenopathy.  RESPIRATORY:  Clear to auscultation bilaterally.  No wheezes, no  crackles, no rhonchi.  CARDIOVASCULAR:  Regular rhythm.  No murmurs, rubs or gallops.  ABDOMEN:  Soft, nontender, nondistended, positive bowel sounds.  EXTREMITIES:  No clubbing, cyanosis or edema.  NEUROLOGIC:  The patient is alert and oriented x3.  Cranial nerves II-  XII are grossly intact.  No focal deficits.   LABORATORY DATA:  Alcohol level less than 5.  UDS is negative.  Point of  care cardiac markers show CK-MB less than 1, troponin-I less than 0.05,  myoglobin 58.1.  CBC with a white count of 3.2, hemoglobin of 12.7,  hematocrit of 36.2, platelets of 178,000, ANC of 2.4.  An i-STAT 8 shows  sodium 143, potassium 3.4, chloride 105, BUN 8,  creatinine 0.7, glucose  of 96.   Chest x-ray shows bronchitic changes in minimal basilar  atelectasis.  CT of the head shows interval resolution of  interhemispheric subdural hematoma, no acute hemorrhage, multiple  periventricular white matter lesions bilaterally consistent with  multiple sclerosis.  MRI with preliminary results with no acute  findings, advanced white matter disease consistent with history of MS.  EKG with a normal sinus rhythm.  UA yellow, hazy, specific gravity  1.010, pH of 8.5, glucose negative, bilirubin negative, ketones  negative, blood negative, protein negative, urobilinogen 0.2, nitrite  negative, trace leukocytes.  Urine microscopy wbc's 0-2.  Bacteria was  rare.   ASSESSMENT/PLAN:  1. Confusion, questionable etiology.  The patient currently at      baseline.  Head CT was negative.  Preliminary MRI reading of the      head was negative.  No signs or symptoms of infection.  Normal      white count.  Chest x-ray was negative.  EKG with a normal sinus      rhythm.  Alcohol level less than 5.  Urine drug screen was      negative.  Point of care cardiac markers were negative.  The      patient is very insistent on being discharged.  Will discharge the      patient home with close follow-up with primary care physician      tomorrow, May 23, 2008.  2. Hypokalemia will replete.  3. Multiple sclerosis.  Continue home medications.   It was a pleasure taking care of [name].      Ramiro Harvest, MD  Electronically Signed     DT/MEDQ  D:  05/22/2008  T:  05/23/2008  Job:  [id number]   cc:   Dario Guardian, M.D.

## 2010-08-09 NOTE — H&P (Signed)
NAME:  [name], [name]                            ACCOUNT NO.:  [id number]   MEDICAL RECORD NO.:  [id number]                   PATIENT TYPE:   LOCATION:                                       FACILITY:  [facility]   PHYSICIAN:  Artist Pais, MD                   DATE OF BIRTH:  [birth year]   DATE OF ADMISSION:  01/12/2002  DATE OF DISCHARGE:                                HISTORY & PHYSICAL   HISTORY OF PRESENT ILLNESS:  The patient is a 55 year old Caucasian female,  gravida 1, para 0 with history of one therapeutic abortion.  She has been  menopausal since 02/1999.  Her past medical history is significant for  uterine artery embolization for a fibroid uterus.  The fibroid has shrunk  from 8 to 9 cm to 5.3 cm.  However, on ultrasound for follow up of the  uterine artery embolization she was found to have a thickened endometrium at  11.3 mm.  She subsequently underwent an endometrial biopsy which showed  extensive necrotic debris associated with antireactive-appearing  endocervical endometrial mucosa.  Because the pathologist who read this said  it would be difficulty to entirely exclude other necrotic neoplasm.  Decision was made to do a dilatation and curettage/hysteroscopy to rule out  necrotic neoplasm.  The endometrium which was sampled was noted to be very  dark and thick.  She did have a sono histogram which revealed no true  endometrial irregularity and thickening but no definite polyps.  Thus, the  patient is noted for a dilatation and curettage  hysteroscopy to rule out  necrotic neoplasm.   Risk of surgery including anesthetic complication, hemorrhage, infection,  damage to adjacent structures including bowel, bladder, blood vessels, and  ureters were discussed with the patient.  She was made aware the risk of  uterine perforation which could result overwhelming life-threatening  hemorrhage requiring emergent hysterectomy or uterine perforation which  could result in bowel damage  which could require an emergent colostomy or  could result in overwhelming life-threatening peritonitis.  She expresses  understanding of and acceptance of these risks.   PAST MEDICAL HISTORY:  1. Hypertension.  2. Multiple sclerosis.  3. Hypercholesterolemia.  4. Headaches.  5. Depression.  6. History of fibroid uterus as above.   ALLERGIES:  No known drug allergies.   CURRENT MEDICATIONS:  1. Hydrochlorothiazide 25 mg q.a.m.  2. Prozac 20 mg daily.  3. Baclofen 10 mg four times daily.  4. Betaseron q.o.d. IM.  5. Prempro 0.45/1.5 mg.  6. Neurontin 20 mg t.i.d.  7. Trazodone 50 mg p.o. q.h.s. p.r.n. insomnia.   SURGERIES:  Uterine artery embolization approximately three years ago.   FAMILY HISTORY:  There is no family history of colon, breast, ovarian or  prostate cancer.  The patient's mother is 47 with hypertension.  Two sisters  ages 35 and 2 are alive and  well.  The patient's father is deceased but she  does not know any information regarding his medical history.   SOCIAL HISTORY:  The patient is a disabled Armed forces operational officer.  Tobacco none.  Alcohol none.   REVIEW OF SYSTEMS:  Noncontributory, except as noted above.  Denies  headache, visual changes, chest pain, shortness of breath, abdominal pain,  change in bowel habits, unintentional weight loss, dysuria, urgency,  frequency, vaginal products or discharge, no other pain or bleeding with  intercourse.   PHYSICAL EXAMINATION:  A well-developed, Caucasian female.  Blood pressure  132/80.  Heart rate 72.  Weight 206.  HEENT:  Normal.  NECK:  Supple without thyromegaly.  LUNGS:  Clear to auscultation.  CARDIAC EXAM:  Regular rate and rhythm.  BREASTS:  Symmetrical without masses. No nipple retraction or nipple  discharge.  ABDOMEN:  Soft, nontender.  No hepatosplenomegaly or masses.  PELVIC EXAMINATION:  Normal external female genitalia.  No vulvar, vaginal  or cervical lesions.  The patient last Pap smear  performed 01/11/01 was  within normal limits.  Bimanual examination revealed the uterus to be ten  weeks mobile, nontender without any adnexa mass palpated.  She has a minimal  cystocele and an or rectocele.  RECTAL:  Excellent sphincter tone.  Confirms pelvic exam.  No masses  palpated.   ASSESSMENT/PLAN:  The patient is a 55 year old Caucasian female with  postmenopausal bleeding and irregular thickening on sono histogram admitted  for dilatation and curettage/hysteroscopy as she was found to have extensive  necrotic debris associated with scanty reactive-appearing endocervical and  endometrial mucosa.  This may well be due to a involuting fibroid.  The  patient understands that it is prudent to do a dilatation and  curettage/hysteroscopy to rule out a necrotic neoplasm.  She stresses  understanding of and acceptance of these risks and desires to proceed with  surgery.                                               Artist Pais, MD    DC/MEDQ  D:  01/11/2002  T:  01/11/2002  Job:  [id number]

## 2010-08-09 NOTE — Op Note (Signed)
NAME:  [name], [name]                            ACCOUNT NO.:  [id number]   MEDICAL RECORD NO.:  [id number]                   PATIENT TYPE:  AMB   LOCATION:  DAY                                  FACILITY:  [facility]   PHYSICIAN:  Sharlet Salina T. Hoxworth, M.D.          DATE OF BIRTH:  05/25/1955   DATE OF PROCEDURE:  06/13/2002  DATE OF DISCHARGE:                                 OPERATIVE REPORT   PREOPERATIVE DIAGNOSES:  Cholelithiasis and cholecystitis.   POSTOPERATIVE DIAGNOSES:  Cholelithiasis and cholecystitis.   PROCEDURE:  Laparoscopic cholecystectomy.   SURGEON:  Lorne Skeens. Hoxworth, M.D.   ASSISTANT:  Donnie Coffin. Samuella Cota, M.D.   ANESTHESIA:  General.   BRIEF HISTORY:  [name] is a 55 year old white female with recurring  episodes of epigastric abdominal pain and nausea. Workup has included a  gallbladder ultrasound showing a single large gallstone. LFTs are normal.  She is felt to have symptomatic cholelithiasis and laparoscopic  cholecystectomy has been recommended and accepted. The nature of the  procedure, its indications and risks of bleeding, infection, bile leak and  bile duct injury were discussed and understood preoperatively. She is now  brought to the operating room for this procedure.   DESCRIPTION OF PROCEDURE:  The patient was brought to the operating room,  placed in supine position on the operating table and general endotracheal  anesthesia was induced. PAS were in place. She received preoperative  antibiotics. The abdomen was sterilely prepped and draped.  Local anesthesia  was used to infiltrate the trocar sites prior to the incisions. A 1 cm  incision was made at the umbilicus, dissection carried down to the midline  fascia which was sharply incised for 1 cm and the peritoneum entered under  direct vision. Through a mattress suture of #0 Vicryl, the Hasson trocar was  placed and pneumoperitoneum established. Under direct vision, a 10 mm trocar  was placed  in the subxiphoid area and two 5 mm trocars along the right  subcostal margin. The gallbladder was visualized and was not markedly  inflamed. The fundus was grasped and elevated up over the liver and the  infundibulum retracted inferolaterally. Fibrofatty tissue was stripped down  off the neck of the gallbladder toward the porta hepatis. The distal  gallbladder was thoroughly dissected. The peritoneum anterior and posterior  along Calot's triangle was incised. The cystic artery was identified  coursing up on the gallbladder wall, was dissected free and divided between  two proximal and distal clips. The cystic duct was thoroughly dissected and  the cystic gallbladder junction dissected 360 degrees. When the anatomy was  clear and the cystic duct was doubly clipped proximally, clipped distally  and divided. The gallbladder was then dissected free from its bed using hook  cautery and removed through the umbilicus. The right upper quadrant was  thoroughly irrigated and suctioned and complete hemostasis assured. Trocars  removed under direct vision.  All CO2 evacuated from the peritoneal cavity.  The mattress suture was secured  at the umbilicus. The skin incisions were closed with interrupted  subcuticular 4-0 Monocryl and Steri-Strips. Sponge, needle and instrument  counts were correct. Dry sterile dressings were applied and the patient  taken to the recovery room in good condition.                                               Lorne Skeens. Hoxworth, M.D.    Tory Emerald  D:  06/13/2002  T:  06/13/2002  Job:  [id number]

## 2010-08-09 NOTE — Op Note (Signed)
[name], [name] NO.:  [id number]   MEDICAL RECORD NO.:  [id number]          PATIENT TYPE:  AMB   LOCATION:  DSC                          FACILITY:  [facility]   PHYSICIAN:  Leonides Grills, M.D.     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   OPERATION:  1. Left first TMT joint fusion with osteotomy.  2. Left local bone graft.  3. Stress x-rays, left foot.  4. Left modified McBride bunionectomy.   ANESTHESIA:  General with block.   SURGEON:  Leonides Grills, M.D.   ASSISTANT:  Evlyn Kanner, P.A.-C.   ESTIMATED BLOOD LOSS:  Minimal.   TOURNIQUET TIME:  Approximately an hour and a half.   COMPLICATIONS:  None.   DISPOSITION:  Stable to PR.   INDICATIONS:  This is a 55 year old female who has had longstanding  bunion pain secondary to hypermobile first ray and a hallux valgus  deformity.  She was consented to the above procedure.  All risks which  include infection, nerve or vessel injury, nonunion, malunion, hardware  rotation, hardware failure, persistent pain, worse pain, prolonged  recovery, stiffness, arthritis, recurrence of deformity, prolonged  recovery were all explained.  Questions were encouraged and answered.   OPERATION:  The patient was brought to the operating room and placed in  supine position.  After adequate general endotracheal tube anesthesia  was administered as well as Ancef 1 g IV piggyback, left lower extremity  was then prepped and draped in a sterile manner.  With a proximally  placed thigh tourniquet, the limb was gravity exsanguinated.  Tourniquet  was elevated to 390 mmHg.  A longitudinal incision over the dorsal  aspect of the medial forefoot was then made in between the EHL  and EHB.  Dissection was carried down through skin.  Hemostasis was obtained.  Interval between the EHL and EHB was then developed.  Superficial  peroneal nerve was protected throughout the case.  First TMT joint was  then entered.  A closing wedge osteotomy was then made, resecting the  lateral distal aspect of the medial cuneiform.  Once this was removed,  the remaining cartilage and the base of first metatarsal was removed as  well.  Local bone graft obtained throughout the procedure from drills as  well as osteophytes was removed and placed on the back table for local  bone graft.  Once the joint was prepared, a bur was used to create a  notch approximately a centimeter distal to the first TMT joint.  We then  extended the wound distally in the dorsal aspect of the first MTP joint  as well.  Dissection was carried down to the lateral capsule of the MTP  joint.  Lateral capsule was then released as well as soft tissues over  the lateral aspect of the lateral  sesamoid and abductor hallices tendon.  We then made a longitudinal incision in the medial aspect of the great  toe MTP joint.  Dissection was carried down through skin.  Hemostasis  was obtained.  Neurovascular structures were identified both superiorly  and inferiorly and protected throughout the case.  L-shaped capsulotomy  was then made, Silver bunionectomy was then performed with a sagittal  saw.  Joint and area were copiously irrigated with normal saline.  We  then went back to the first TMT joint, reduced this with a two-point  reduction clamp, after multiple 2-mm drill holes were placed on either  side of the joint for preparation of the fusion.  Once the two-point  reduction clamp reduced the joint anatomically which was translated  plantarly as well, we then fixed this with two 3.5-mm fully threaded  cortical lag screws using 3.5/2.5-mm drill holes respectively.  This had  excellent purchase and maintenance of the  correction.  We then copiously  irrigated the MTP joint with normal saline.  Capsule was repaired and  advanced both superiorly and proximally with 2-0 Vicryl stitches and had  an outstanding repair.  Final stress x-rays were obtained, AP and  lateral planes, and showed no gross motion across the first TMT joint,  fixation and proper position and correction excellent as well.  We did  not want to over correct the metatarsus primus varus.  This was dialed  in appropriately and clinically had excellent range of motion of the MTP  joint, and the toe was clinically in excellent position as well.  We  then placed stress strain relieving bone graft on the dorsomedial  lateral aspect of first TMT joint.  After the area was copiously  irrigated with normal saline, tourniquet was deflated.  Hemostasis was  obtained.  Subcu was closed with 3-0 Vicryl.  Skin was closed with 4-0  nylon over all wounds.  Sterile dressing was applied.  __________ [address]  dressing was applied.  Modified Jones dressing was applied.  The patient  was stable to PR.      Leonides Grills, M.D.  Electronically Signed     PB/MEDQ  D:  03/10/2006  T:  03/11/2006  Job:  [id number]

## 2010-08-09 NOTE — Consult Note (Signed)
   [name], [name]                            ACCOUNT NO.:  [id number]   MEDICAL RECORD NO.:  [id number]                   PATIENT TYPE:  EMS   LOCATION:  ED                                   FACILITY:  [facility]   PHYSICIAN:  Currie Paris, M.D.           DATE OF BIRTH:  03-10-1956   DATE OF CONSULTATION:  06/19/2002  DATE OF DISCHARGE:                                   CONSULTATION   CHIEF COMPLAINT:  Redness at incision.   HISTORY OF PRESENT ILLNESS:  The patient is about a week status post  laparoscopic cholecystectomy by Dr. Johna Sheriff.  She has noticed increasing  redness at the umbilical incision plus a large flare of redness along the  lower abdomen.  She called me this morning and I asked to see her in the  emergency room.   She has had no fever or chills.  She otherwise feels okay.   PHYSICAL EXAMINATION:  ABDOMEN:  On exam, her abdomen does have a mild  amount of erythema at the umbilical incision but a large flare of erythema  that extends across the entire lower abdomen from the lateral edge of one  rectus all the way to the lateral edge of the other rectus and from about 2  to 3 cm below the umbilicus down to the suprapubic area.  It is not  fluctuant, it is not particularly tender, it is a little bit warm.   The rest of her abdomen is completely benign.   IMPRESSION:  Superficial cellulitis.   PLAN:  I went ahead and discussed the situation with the patient and with  her permission prepped the area of the umbilicus, anesthetized it with some  1% Xylocaine, and took out the skin sutures.  There was no significant  collection of pus subcutaneously.  I did not go down to the fascia.  I did  pack this with a little 2x2.   At this point I will give her 1 gram of Rocephin IM now. We will also put  her on Keflex 500 mg t.i.d. and have her follow up in the office in three  days, sooner if she develops any significant fevers, chills, or increasing  erythema.                                              Currie Paris, M.D.    CJS/MEDQ  D:  06/19/2002  T:  06/19/2002  Job:  [id number]

## 2010-08-09 NOTE — Op Note (Signed)
[name], [name]                            ACCOUNT NO.:  [id number]   MEDICAL RECORD NO.:  [id number]                   PATIENT TYPE:  AMB   LOCATION:  SDC                                  FACILITY:  [facility]   PHYSICIAN:  Artist Pais, MD                   DATE OF BIRTH:  05/04/1955   DATE OF PROCEDURE:  01/12/2002  DATE OF DISCHARGE:                                 OPERATIVE REPORT   PREOPERATIVE DIAGNOSES:  1. Postmenopausal bleeding.  2. The patient with a history of uterine artery embolization in 2000.  3. Pathology on endometrial biopsy revealing extensive necrotic debris     associated with scanty reactive appearing endocervical endometrial mucosa     as well as foreign material.   POSTOPERATIVE DIAGNOSES:  1. Postmenopausal bleeding.  2. The patient with a history of uterine artery embolization in 2000.  3. Pathology on endometrial biopsy revealing extensive necrotic debris     associated with scanty reactive appearing endocervical endometrial mucosa     as well as foreign material.   PROCEDURE:  1. Planned procedure dilatation and curettage.  2. Hysteroscopy procedure actually with curettage.   SURGEON:  Artist Pais, MD   ESTIMATED BLOOD LOSS:  Minimal.   ANESTHESIA:  Monitored anesthesia care plus 9 cc 1% lidocaine paracervical  block.   FLUIDS:  600 cc of crystalloid.   FINDINGS:  A very small cervix.  The endometrial canal tracks to the  patient's left.  Unable to dilate past a number 15 Pratt dilator safely so  curettage with a Pipelle was performed.   COMPLICATIONS:  None.   DESCRIPTION OF OPERATION:  The patient is brought to the operating room,  identified on the operating table.  After induction of adequate MAC  analgesia the patient was placed in the dorsal lithotomy position and  prepped and draped in the usual sterile fashion.  The bladder was straight  catheterized for approximately 100 cc of clear yellow urine.  An examination  under  anesthesia revealed the uterus to be about 8-10 weeks, anteverted, and  mobile.  A speculum was placed and the anterior lip of the cervix was  grasped with a single tooth tenaculum.  Approximately 9 cc of 1% lidocaine  was placed for a paracervical block.  The cervix was noted to be very small.  I began dilation with a number 13 Pratt dilator and the canal was noted to  track to the left.  I subsequently used the Pipelle as a sound because this  was smaller and this again tracked to the left as in the office.  I was able  to place the Pipelle up to the fundus.  I subsequently used another 15 Pratt  dilator.  Was able to very easily dilate the cervix to a number 15 Pratt  dilator.  However, after a number 15 Pratt dilator, I was  unable to dilate  the cervix without force.  I tried this multiple times and again placed the  Pipelle and was able to sound the uterus again.  It was this surgeon's  decision that attempt to use force to dilate the cervix was not in the  patient's best interest as this could increase the risk of a lateral  perforation resulting in overwhelming life threatening hemorrhage.  Thus, I  elected to discontinue the planned procedure and using the Pipelle multiple  passes of the Pipelle were performed obtaining copious amounts of  endometrial tissue.  I actually used this as a curette and was able to  obtain even more copious portions of tissue to rule out a necrotic neoplasm.  Thus, it was this surgeon's decision to not increase the risk of a uterine  perforation as I was able to easily ___________ the endometrial canal with  the Pipelle and with the patient sedated able to obtain copious portions of  tissue.  The patient tolerated the procedure well.  There was noted to be no  bleeding.  At that point the procedure was then terminated.  The patient  tolerated procedure well without apparent complications and was transferred  to the recovery room in stable condition after  all instrument, sponge,  needle counts were correct.  She received the postoperative D&C instruction  sheet.  Is urged to return to the office in two weeks.                                               Artist Pais, MD    DC/MEDQ  D:  01/12/2002  T:  01/12/2002  Job:  [id number]   cc:   Santina Evans A. Orlin Hilding, M.D.  [address]  [place]  [place] [address]  Fax: [phone]   Sharlet [place], M.D.

## 2010-08-09 NOTE — Op Note (Signed)
NAMEMOZETTA, MURFIN NO.:  [id number]   MEDICAL RECORD NO.:  [id number]          PATIENT TYPE:  AMB   LOCATION:  DSC                          FACILITY:  [facility]   PHYSICIAN:  Cindee Salt, M.D.       DATE OF BIRTH:  November 13, 1955   DATE OF PROCEDURE:  07/30/2005  DATE OF DISCHARGE:                                 OPERATIVE REPORT   PREOPERATIVE DIAGNOSIS:  Carpal tunnel syndrome, left hand.   POSTOPERATIVE DIAGNOSIS:  Carpal tunnel syndrome, left hand.   OPERATION:  Decompression of left median nerve.   SURGEON:  Cindee Salt, M.D.   ASSISTANT:  Alfredo Bach, R.N.   ANESTHESIA:  Forearm-based IV regional.   HISTORY:  The patient is a 55 year old female with a history of carpal  tunnel syndrome, EMG and nerve conductions positive, which has not responded  to conservative treatment.   PROCEDURE:  The patient was brought to the operating room after marking the  area in the preoperative area and identifying the patient.  Antibiotic was  given.  Questions were answered.   The patient was brought to the operating room where a forearm-based IV  regional anesthetic was carried out without difficulty.  She was prepped  using DuraPrep, supine position, left arm free.  A longitudinal incision was  made in the palm and carried down through subcutaneous tissue; bleeders were  electrocauterized.  Palmar fascia was split, superficial palmar arch  identified, flexor tendon to the ring and little finger identified.  To the  ulnar side of the median nerve, the carpal retinaculum was incised with  sharp dissection.  A right-angle and Sewall retractor were placed between  the skin and forearm fascia.  The fascia was released for approximately a  centimeter and a half proximal to the wrist crease under direct vision.  Canal was explored.  Persistent median artery was present; this was not  thrombosed and no aneurysm seen.  Tenosynovial tissue was markedly  thickened.  No further  lesions were identified.  The wound was irrigated.  The skin was then closed with interrupted 5-0 nylon sutures.  A sterile  compressive dressing and splint to the hand were applied.  The patient  tolerated the procedure well and was taken to the recovery room for  observation in satisfactory condition.   She is discharged home to return to the [facility] in 1 week  on Vicodin.           ______________________________  Cindee Salt, M.D.     GK/MEDQ  D:  07/30/2005  T:  07/31/2005  Job:  [id number]

## 2010-08-09 NOTE — Procedures (Signed)
[place]  Patient:    [name], [name]                         MRN: [id number] Proc. Date: 05/12/00 Adm. Date:  [id number] Attending:  Charna Elizabeth CC:         Jenel Lucks, M.D.   Procedure Report  DATE OF BIRTH:  May 22, 1955  REFERRING PHYSICIAN:  Jenel Lucks, M.D.  PROCEDURE PERFORMED:  Esophagogastroduodenoscopy.  ENDOSCOPIST:  Anselmo Rod, M.D.  INSTRUMENT USED:  Olympus video panendoscope.  INDICATIONS FOR PROCEDURE:  Iron deficiency anemia in a 55 year old white female rule out peptic ulcer disease, esophagitis, gastritis, etc.  PREPROCEDURE PREPARATION:  Informed consent was procured from the patient. The patient was fasted for eight hours prior to the procedure.  PREPROCEDURE PHYSICAL:  The patient had stable vital signs.  Neck supple. Chest clear to auscultation.  S1, S2 regular.  Abdomen soft with normal abdominal bowel sounds.  DESCRIPTION OF PROCEDURE:  The patient was placed in left lateral decubitus position and sedated with an additional 20 mg of Demerol and 2 mg of Versed intravenously.  Once the patient was adequately sedated and maintained on low-flow oxygen and continuous cardiac monitoring, the Olympus video panendoscope was advanced through the mouthpiece, over the tongue, into the esophagus under direct vision.  The entire esophagus appeared normal without evidence of ring stricture, masses, lesions, esophagitis, or Barretts mucosa. The scope was then advanced into the stomach.  A small hiatal hernia was seen on high retroflexion.  The rest of the gastric mucosa and the proximal small bowel appeared normal.  The patient tolerated the procedure well without complications.  IMPRESSION:  Small hiatal hernia.  Otherwise normal esophagogastroduodenoscopy.  RECOMMENDATION:  Proceed with small bowel follow-through to complete gastrointestinal evaluation. DD:  05/12/00 TD:  05/12/00 Job: [id number]

## 2010-08-09 NOTE — Procedures (Signed)
[place]  Patient:    [name], [name]                         MRN: [id number] Proc. Date: 05/12/00 Adm. Date:  [id number] Attending:  Charna Elizabeth CC:         Jenel Lucks, M.D.   Procedure Report  DATE OF BIRTH:  08/16/1955  REFERRING PHYSICIAN:  Jenel Lucks, M.D.  PROCEDURE PERFORMED:  Colonoscopy with snare polypectomy x 1.  ENDOSCOPIST:  Anselmo Rod, M.D.  INSTRUMENT USED:  Olympus video colonoscope.  INDICATIONS FOR PROCEDURE:  Iron deficiency anemia in a 55 year old white female with a history of MS, rule out colonic polyps, masses, hemorrhoids, etc.  PREPROCEDURE PREPARATION:  Informed consent was procured from the patient. The patient was fasted for eight hours prior to the procedure and prepped with a bottle of magnesium citrate and a gallon of NuLytely the night prior to the procedure.  PREPROCEDURE PHYSICAL:  The patient had stable vital signs.  Neck supple. Chest clear to auscultation.  S1, S2 regular.  Abdomen soft with normal abdominal bowel sounds.  DESCRIPTION OF PROCEDURE:  The patient was placed in the left lateral decubitus position and sedated with 50 mg of Demerol and 5 mg of Versed intravenously.  Once the patient was adequately sedated and maintained on low-flow oxygen and continuous cardiac monitoring, the Olympus video colonoscope was advanced from the rectum to the cecum without difficulty. Except for a small sessile polyp that was snared from the midright colon, no other abnormalities were seen.  The appendicular orifice and ileocecal valve were clearly visualized and photographed.  The patient tolerated the procedure well without complications.  IMPRESSION: 1. Essentially healthy-appearing  colon except for a small sessile polyp    snared from midright colon. 2. No masses or diverticulosis seen.  RECOMMENDATIONS:  Proceed with esophagogastroduodenoscopy at this time.DD: 05/12/00 TD:   05/12/00 Job: [id number]

## 2010-10-29 ENCOUNTER — Other Ambulatory Visit (HOSPITAL_COMMUNITY): Payer: Self-pay | Admitting: Obstetrics and Gynecology

## 2010-10-29 DIAGNOSIS — Z1231 Encounter for screening mammogram for malignant neoplasm of breast: Secondary | ICD-10-CM

## 2010-11-21 ENCOUNTER — Ambulatory Visit (HOSPITAL_COMMUNITY): Payer: PRIVATE HEALTH INSURANCE

## 2010-11-27 ENCOUNTER — Ambulatory Visit (HOSPITAL_COMMUNITY)
Admission: RE | Admit: 2010-11-27 | Discharge: 2010-11-27 | Disposition: A | Discharge status: Home / Self Care | Payer: PRIVATE HEALTH INSURANCE | Source: Ambulatory Visit | Attending: Obstetrics and Gynecology | Admitting: Obstetrics and Gynecology

## 2010-11-27 DIAGNOSIS — Z1231 Encounter for screening mammogram for malignant neoplasm of breast: Secondary | ICD-10-CM

## 2011-03-04 ENCOUNTER — Other Ambulatory Visit: Payer: Self-pay | Admitting: Family Medicine

## 2011-03-04 ENCOUNTER — Ambulatory Visit
Admission: RE | Admit: 2011-03-04 | Discharge: 2011-03-04 | Disposition: A | Discharge status: Home / Self Care | Payer: PRIVATE HEALTH INSURANCE | Source: Ambulatory Visit | Attending: Family Medicine | Admitting: Family Medicine

## 2011-03-04 DIAGNOSIS — M549 Dorsalgia, unspecified: Secondary | ICD-10-CM

## 2011-03-04 DIAGNOSIS — M545 Low back pain, unspecified: Secondary | ICD-10-CM

## 2011-03-06 ENCOUNTER — Other Ambulatory Visit: Payer: PRIVATE HEALTH INSURANCE

## 2011-04-01 ENCOUNTER — Encounter (HOSPITAL_COMMUNITY): Payer: Self-pay

## 2011-04-08 ENCOUNTER — Other Ambulatory Visit (HOSPITAL_COMMUNITY): Payer: Self-pay | Admitting: *Deleted

## 2011-04-09 ENCOUNTER — Encounter (HOSPITAL_COMMUNITY): Payer: Self-pay

## 2011-04-09 ENCOUNTER — Encounter (HOSPITAL_COMMUNITY)
Admission: RE | Admit: 2011-04-09 | Discharge: 2011-04-09 | Disposition: A | Discharge status: Home / Self Care | Payer: Medicare Other | Source: Ambulatory Visit | Attending: Neurological Surgery | Admitting: Neurological Surgery

## 2011-04-09 HISTORY — DX: Headache: R51

## 2011-04-09 HISTORY — DX: Other complications of anesthesia, initial encounter: T88.59XA

## 2011-04-09 HISTORY — DX: Personal history of other diseases of the digestive system: Z87.19

## 2011-04-09 HISTORY — DX: Cardiac murmur, unspecified: R01.1

## 2011-04-09 HISTORY — DX: Gastro-esophageal reflux disease without esophagitis: K21.9

## 2011-04-09 HISTORY — DX: Unspecified osteoarthritis, unspecified site: M19.90

## 2011-04-09 HISTORY — DX: Chronic kidney disease, unspecified: N18.9

## 2011-04-09 HISTORY — DX: Myoneural disorder, unspecified: G70.9

## 2011-04-09 HISTORY — DX: Anemia, unspecified: D64.9

## 2011-04-09 HISTORY — DX: Adverse effect of unspecified anesthetic, initial encounter: T41.45XA

## 2011-04-09 HISTORY — DX: Essential (primary) hypertension: I10

## 2011-04-09 LAB — SURGICAL PCR SCREEN
MRSA, PCR: POSITIVE — AB
Staphylococcus aureus: POSITIVE — AB

## 2011-04-09 LAB — DIFFERENTIAL
Eosinophils Absolute: 0.1 10*3/uL (ref 0.0–0.7)
Lymphocytes Relative: 21 % (ref 12–46)
Lymphs Abs: 1 10*3/uL (ref 0.7–4.0)
Monocytes Relative: 8 % (ref 3–12)
Neutro Abs: 3 10*3/uL (ref 1.7–7.7)
Neutrophils Relative %: 67 % (ref 43–77)

## 2011-04-09 LAB — APTT: aPTT: 24 seconds (ref 24–37)

## 2011-04-09 LAB — ABO/RH: ABO/RH(D): O POS

## 2011-04-09 LAB — BASIC METABOLIC PANEL
BUN: 17 mg/dL (ref 6–23)
Chloride: 100 mEq/L (ref 96–112)
GFR calc Af Amer: >90 mL/min (ref >90)
GFR calc non Af Amer: >90 mL/min (ref >90)
Potassium: 3.8 mEq/L (ref 3.5–5.1)

## 2011-04-09 LAB — TYPE AND SCREEN: ABO/RH(D): O POS

## 2011-04-09 LAB — CBC
Hemoglobin: 12.2 g/dL (ref 12.0–15.0)
MCH: 27.7 pg (ref 26.0–34.0)
Platelets: 199 10*3/uL (ref 150–400)
RBC: 4.41 MIL/uL (ref 3.87–5.11)
WBC: 4.5 10*3/uL (ref 4.0–10.5)

## 2011-04-09 LAB — PROTIME-INR
INR: 0.93 (ref 0.00–1.49)
Prothrombin Time: 12.7 seconds (ref 11.6–15.2)

## 2011-04-09 NOTE — Pre-Procedure Instructions (Addendum)
20 [name]  04/09/2011   Your procedure is scheduled on:  04/16/11  Report to [facility] at 1025 AM.  Call this number if you have problems the morning of surgery: [phone]   Remember:   Do not eat food:After Midnight.  May have clear liquids: up to 4 Hours before arrival.  Clear liquids include soda, tea, black coffee, apple or grape juice, broth.  Take these medicines the morning of surgery with A SIP OF WATER: NORVASC, ZOLOFT, BACLOFEN, SINGULAIR, FLONASE, NEURONTIN, OXYCODONE  STOP FISH OIL, MULTI VIT ANY ASA NSAIDS  TODAY   Do not wear jewelry, make-up or nail polish.  Do not wear lotions, powders, or perfumes. You may wear deodorant.  Do not shave 48 hours prior to surgery.  Do not bring valuables to the hospital.  Contacts, dentures or bridgework may not be worn into surgery.  Leave suitcase in the car. After surgery it may be brought to your room.  For patients admitted to the hospital, checkout time is 11:00 AM the day of discharge.   Patients discharged the day of surgery will not be allowed to drive home.  Name and phone number of your driver: Eloy End [phone]  Special Instructions: CHG Shower Use Special Wash: 1/2 bottle night before surgery and 1/2 bottle morning of surgery.   Please read over the following fact sheets that you were given: Pain Booklet, Coughing and Deep Breathing, MRSA Information and Surgical Site Infection Prevention

## 2011-04-09 NOTE — Progress Notes (Addendum)
[facility]  [name] CALLED FOR EKG NOTES DR Yetta Barre OFFICE CALLED FOR NEW ORDER, UNABLE TO READ COMPLETE  PROCEDURE

## 2011-04-16 ENCOUNTER — Encounter (HOSPITAL_COMMUNITY)
Admission: RE | Disposition: A | Discharge status: Home / Self Care | Payer: Self-pay | Source: Ambulatory Visit | Attending: Neurological Surgery

## 2011-04-16 ENCOUNTER — Ambulatory Visit (HOSPITAL_COMMUNITY): Payer: Medicare Other

## 2011-04-16 ENCOUNTER — Inpatient Hospital Stay (HOSPITAL_COMMUNITY)
Admission: RE | Admit: 2011-04-16 | Discharge: 2011-04-21 | DRG: 460 | Disposition: A | Discharge status: Home / Self Care | Payer: Medicare Other | Source: Ambulatory Visit | Attending: Neurological Surgery | Admitting: Neurological Surgery

## 2011-04-16 ENCOUNTER — Encounter (HOSPITAL_COMMUNITY): Payer: Self-pay | Admitting: *Deleted

## 2011-04-16 ENCOUNTER — Encounter (HOSPITAL_COMMUNITY): Payer: Self-pay | Admitting: Anesthesiology

## 2011-04-16 ENCOUNTER — Ambulatory Visit (HOSPITAL_COMMUNITY): Payer: Medicare Other | Admitting: Anesthesiology

## 2011-04-16 DIAGNOSIS — Z87891 Personal history of nicotine dependence: Secondary | ICD-10-CM

## 2011-04-16 DIAGNOSIS — Q762 Congenital spondylolisthesis: Secondary | ICD-10-CM

## 2011-04-16 DIAGNOSIS — Z981 Arthrodesis status: Secondary | ICD-10-CM

## 2011-04-16 DIAGNOSIS — K219 Gastro-esophageal reflux disease without esophagitis: Secondary | ICD-10-CM | POA: Diagnosis present

## 2011-04-16 DIAGNOSIS — I10 Essential (primary) hypertension: Secondary | ICD-10-CM | POA: Diagnosis present

## 2011-04-16 DIAGNOSIS — M47817 Spondylosis without myelopathy or radiculopathy, lumbosacral region: Principal | ICD-10-CM | POA: Diagnosis present

## 2011-04-16 DIAGNOSIS — Z79899 Other long term (current) drug therapy: Secondary | ICD-10-CM

## 2011-04-16 DIAGNOSIS — Z01812 Encounter for preprocedural laboratory examination: Secondary | ICD-10-CM

## 2011-04-16 HISTORY — DX: Spondylolysis, lumbar region: M43.06

## 2011-04-16 HISTORY — DX: Spinal stenosis, lumbar region without neurogenic claudication: M48.061

## 2011-04-16 HISTORY — PX: POSTERIOR FUSION LUMBAR SPINE: SUR632

## 2011-04-16 SURGERY — POSTERIOR LUMBAR FUSION 1 LEVEL
Anesthesia: General | Site: Back | Wound class: Clean

## 2011-04-16 MED ORDER — LISINOPRIL 20 MG PO TABS
20.0000 mg | ORAL_TABLET | Freq: Every day | ORAL | Status: DC
  Administered 2011-04-17 – 2011-04-21 (×5): 20 mg via ORAL
  Filled 2011-04-16 (×5): qty 1

## 2011-04-16 MED ORDER — DEXAMETHASONE 4 MG PO TABS
4.0000 mg | ORAL_TABLET | Freq: Four times a day (QID) | ORAL | Status: DC
  Administered 2011-04-16 – 2011-04-21 (×17): 4 mg via ORAL
  Filled 2011-04-16 (×23): qty 1

## 2011-04-16 MED ORDER — THROMBIN 20000 UNITS EX KIT
PACK | CUTANEOUS | Status: DC | PRN
  Administered 2011-04-16: 13:00:00 via TOPICAL

## 2011-04-16 MED ORDER — GLYCOPYRROLATE 0.2 MG/ML IJ SOLN
INTRAMUSCULAR | Status: DC | PRN
  Administered 2011-04-16: .6 mg via INTRAVENOUS

## 2011-04-16 MED ORDER — NEOSTIGMINE METHYLSULFATE 1 MG/ML IJ SOLN
INTRAMUSCULAR | Status: DC | PRN
  Administered 2011-04-16: 5 mg via INTRAVENOUS

## 2011-04-16 MED ORDER — WHITE PETROLATUM GEL
Status: AC
  Filled 2011-04-16: qty 5

## 2011-04-16 MED ORDER — MONTELUKAST SODIUM 10 MG PO TABS
10.0000 mg | ORAL_TABLET | Freq: Every day | ORAL | Status: DC
  Administered 2011-04-16 – 2011-04-20 (×5): 10 mg via ORAL
  Filled 2011-04-16 (×7): qty 1

## 2011-04-16 MED ORDER — PROPOFOL 10 MG/ML IV BOLUS
INTRAVENOUS | Status: DC | PRN
  Administered 2011-04-16: 160 mg via INTRAVENOUS

## 2011-04-16 MED ORDER — METHOCARBAMOL 100 MG/ML IJ SOLN
500.0000 mg | Freq: Four times a day (QID) | INTRAVENOUS | Status: DC | PRN
  Filled 2011-04-16: qty 5

## 2011-04-16 MED ORDER — SERTRALINE HCL 50 MG PO TABS
150.0000 mg | ORAL_TABLET | Freq: Every day | ORAL | Status: DC
  Administered 2011-04-16 – 2011-04-21 (×6): 150 mg via ORAL
  Filled 2011-04-16: qty 3
  Filled 2011-04-16: qty 1.5
  Filled 2011-04-16 (×4): qty 3

## 2011-04-16 MED ORDER — OXYCODONE-ACETAMINOPHEN 5-325 MG PO TABS
1.0000 | ORAL_TABLET | ORAL | Status: DC | PRN
  Administered 2011-04-17: 2 via ORAL
  Administered 2011-04-21: 1 via ORAL
  Filled 2011-04-16: qty 1
  Filled 2011-04-16: qty 2

## 2011-04-16 MED ORDER — BUPIVACAINE HCL (PF) 0.25 % IJ SOLN
INTRAMUSCULAR | Status: DC | PRN
  Administered 2011-04-16: 10 mL

## 2011-04-16 MED ORDER — METHOCARBAMOL 500 MG PO TABS
500.0000 mg | ORAL_TABLET | Freq: Four times a day (QID) | ORAL | Status: DC | PRN
  Administered 2011-04-17: 500 mg via ORAL
  Filled 2011-04-16: qty 1

## 2011-04-16 MED ORDER — THERA M PLUS PO TABS
1.0000 | ORAL_TABLET | Freq: Every day | ORAL | Status: DC
  Administered 2011-04-17 – 2011-04-21 (×5): 1 via ORAL
  Filled 2011-04-16 (×5): qty 1

## 2011-04-16 MED ORDER — FLUTICASONE PROPIONATE 50 MCG/ACT NA SUSP
2.0000 | Freq: Two times a day (BID) | NASAL | Status: DC | PRN
  Filled 2011-04-16: qty 16

## 2011-04-16 MED ORDER — 0.9 % SODIUM CHLORIDE (POUR BTL) OPTIME
TOPICAL | Status: DC | PRN
  Administered 2011-04-16: 1000 mL

## 2011-04-16 MED ORDER — CEFAZOLIN SODIUM 1-5 GM-% IV SOLN
1.0000 g | Freq: Three times a day (TID) | INTRAVENOUS | Status: AC
  Administered 2011-04-16 – 2011-04-17 (×2): 1 g via INTRAVENOUS
  Filled 2011-04-16 (×2): qty 50

## 2011-04-16 MED ORDER — ACETAMINOPHEN 325 MG PO TABS
650.0000 mg | ORAL_TABLET | ORAL | Status: DC | PRN
  Administered 2011-04-17 – 2011-04-20 (×4): 650 mg via ORAL
  Filled 2011-04-16 (×4): qty 2

## 2011-04-16 MED ORDER — ALPRAZOLAM 0.5 MG PO TABS
0.5000 mg | ORAL_TABLET | Freq: Once | ORAL | Status: AC
  Administered 2011-04-16: 0.5 mg via ORAL
  Filled 2011-04-16: qty 1

## 2011-04-16 MED ORDER — ACETAMINOPHEN 650 MG RE SUPP: 650.0000 mg | RECTAL | Status: DC | PRN

## 2011-04-16 MED ORDER — MEPERIDINE HCL 25 MG/ML IJ SOLN: 6.2500 mg | INTRAMUSCULAR | Status: DC | PRN

## 2011-04-16 MED ORDER — EPHEDRINE SULFATE 50 MG/ML IJ SOLN
INTRAMUSCULAR | Status: DC | PRN
  Administered 2011-04-16 (×2): 10 mg via INTRAVENOUS
  Administered 2011-04-16: 5 mg via INTRAVENOUS

## 2011-04-16 MED ORDER — SODIUM CHLORIDE 0.9 % IV SOLN: 250.0000 mL | INTRAVENOUS | Status: DC

## 2011-04-16 MED ORDER — DEXAMETHASONE SODIUM PHOSPHATE 10 MG/ML IJ SOLN
INTRAMUSCULAR | Status: DC | PRN
  Administered 2011-04-16: 10 mg via INTRAVENOUS

## 2011-04-16 MED ORDER — CEFAZOLIN SODIUM 1-5 GM-% IV SOLN: 1.0000 g | Freq: Once | INTRAVENOUS | Status: DC

## 2011-04-16 MED ORDER — MECLIZINE HCL 25 MG PO TABS
25.0000 mg | ORAL_TABLET | Freq: Three times a day (TID) | ORAL | Status: DC | PRN
  Filled 2011-04-16: qty 1

## 2011-04-16 MED ORDER — AMLODIPINE BESYLATE 10 MG PO TABS
10.0000 mg | ORAL_TABLET | Freq: Every day | ORAL | Status: DC
  Administered 2011-04-16 – 2011-04-20 (×5): 10 mg via ORAL
  Filled 2011-04-16 (×6): qty 1

## 2011-04-16 MED ORDER — ZOLPIDEM TARTRATE 10 MG PO TABS
10.0000 mg | ORAL_TABLET | Freq: Every evening | ORAL | Status: DC | PRN
  Administered 2011-04-20: 10 mg via ORAL
  Filled 2011-04-16 (×2): qty 1

## 2011-04-16 MED ORDER — GABAPENTIN 300 MG PO CAPS: 300.0000 mg | ORAL_CAPSULE | Freq: Three times a day (TID) | ORAL | Status: DC

## 2011-04-16 MED ORDER — DEXAMETHASONE SODIUM PHOSPHATE 10 MG/ML IJ SOLN
INTRAMUSCULAR | Status: AC
  Filled 2011-04-16: qty 1

## 2011-04-16 MED ORDER — GABAPENTIN 300 MG PO CAPS
300.0000 mg | ORAL_CAPSULE | ORAL | Status: DC | PRN
  Administered 2011-04-16: 300 mg via ORAL
  Filled 2011-04-16 (×2): qty 1

## 2011-04-16 MED ORDER — HYDROCHLOROTHIAZIDE 25 MG PO TABS
25.0000 mg | ORAL_TABLET | Freq: Every day | ORAL | Status: DC
  Administered 2011-04-17 – 2011-04-21 (×5): 25 mg via ORAL
  Filled 2011-04-16 (×5): qty 1

## 2011-04-16 MED ORDER — HYDROMORPHONE HCL PF 1 MG/ML IJ SOLN
INTRAMUSCULAR | Status: AC
  Filled 2011-04-16: qty 1

## 2011-04-16 MED ORDER — SODIUM CHLORIDE 0.9 % IR SOLN
Status: DC | PRN
  Administered 2011-04-16: 13:00:00

## 2011-04-16 MED ORDER — SODIUM CHLORIDE 0.9 % IJ SOLN
3.0000 mL | Freq: Two times a day (BID) | INTRAMUSCULAR | Status: DC
  Administered 2011-04-16 – 2011-04-21 (×7): 3 mL via INTRAVENOUS

## 2011-04-16 MED ORDER — PANTOPRAZOLE SODIUM 40 MG PO TBEC
40.0000 mg | DELAYED_RELEASE_TABLET | Freq: Every day | ORAL | Status: DC
  Administered 2011-04-17 – 2011-04-20 (×4): 40 mg via ORAL
  Filled 2011-04-16 (×4): qty 1

## 2011-04-16 MED ORDER — TRAZODONE HCL 100 MG PO TABS
100.0000 mg | ORAL_TABLET | Freq: Every day | ORAL | Status: DC
  Administered 2011-04-17 – 2011-04-20 (×4): 100 mg via ORAL
  Filled 2011-04-16 (×6): qty 1

## 2011-04-16 MED ORDER — SENNA 8.6 MG PO TABS
1.0000 | ORAL_TABLET | Freq: Two times a day (BID) | ORAL | Status: DC
  Administered 2011-04-16 – 2011-04-21 (×6): 8.6 mg via ORAL
  Filled 2011-04-16 (×11): qty 1

## 2011-04-16 MED ORDER — BACITRACIN 50000 UNITS IM SOLR
INTRAMUSCULAR | Status: AC
  Filled 2011-04-16: qty 1

## 2011-04-16 MED ORDER — DEXAMETHASONE SODIUM PHOSPHATE 4 MG/ML IJ SOLN
4.0000 mg | Freq: Four times a day (QID) | INTRAMUSCULAR | Status: DC
  Filled 2011-04-16 (×23): qty 1

## 2011-04-16 MED ORDER — PHENOL 1.4 % MT LIQD
1.0000 | OROMUCOSAL | Status: DC | PRN
  Filled 2011-04-16: qty 177

## 2011-04-16 MED ORDER — SODIUM CHLORIDE 0.9 % IJ SOLN: 3.0000 mL | INTRAMUSCULAR | Status: DC | PRN

## 2011-04-16 MED ORDER — LISINOPRIL-HYDROCHLOROTHIAZIDE 20-25 MG PO TABS: 1.0000 | ORAL_TABLET | Freq: Every day | ORAL | Status: DC

## 2011-04-16 MED ORDER — HYDROMORPHONE HCL PF 1 MG/ML IJ SOLN
0.5000 mg | INTRAMUSCULAR | Status: DC | PRN
  Administered 2011-04-16 – 2011-04-17 (×7): 1 mg via INTRAVENOUS
  Filled 2011-04-16 (×8): qty 1

## 2011-04-16 MED ORDER — POTASSIUM CHLORIDE IN NACL 20-0.9 MEQ/L-% IV SOLN
INTRAVENOUS | Status: DC
  Administered 2011-04-16 – 2011-04-18 (×2): via INTRAVENOUS
  Filled 2011-04-16 (×10): qty 1000

## 2011-04-16 MED ORDER — GABAPENTIN 100 MG PO CAPS
200.0000 mg | ORAL_CAPSULE | Freq: Three times a day (TID) | ORAL | Status: DC
  Administered 2011-04-17 – 2011-04-21 (×12): 200 mg via ORAL
  Filled 2011-04-16 (×16): qty 2

## 2011-04-16 MED ORDER — ROSUVASTATIN CALCIUM 20 MG PO TABS
20.0000 mg | ORAL_TABLET | Freq: Every day | ORAL | Status: DC
  Administered 2011-04-16 – 2011-04-19 (×4): 20 mg via ORAL
  Filled 2011-04-16 (×6): qty 1

## 2011-04-16 MED ORDER — ONDANSETRON HCL 4 MG/2ML IJ SOLN
INTRAMUSCULAR | Status: DC | PRN
  Administered 2011-04-16: 4 mg via INTRAVENOUS

## 2011-04-16 MED ORDER — SUFENTANIL CITRATE 50 MCG/ML IV SOLN
INTRAVENOUS | Status: DC | PRN
  Administered 2011-04-16 (×5): 10 ug via INTRAVENOUS

## 2011-04-16 MED ORDER — HYDROMORPHONE HCL PF 1 MG/ML IJ SOLN
0.2500 mg | INTRAMUSCULAR | Status: DC | PRN
  Administered 2011-04-16 (×4): 0.5 mg via INTRAVENOUS

## 2011-04-16 MED ORDER — SODIUM CHLORIDE 0.9 % IV SOLN
INTRAVENOUS | Status: AC
  Filled 2011-04-16: qty 500

## 2011-04-16 MED ORDER — LACTATED RINGERS IV SOLN
INTRAVENOUS | Status: DC | PRN
Start: 2011-04-16 — End: 2011-04-16
  Administered 2011-04-16 (×3): via INTRAVENOUS

## 2011-04-16 MED ORDER — MIDAZOLAM HCL 5 MG/5ML IJ SOLN
INTRAMUSCULAR | Status: DC | PRN
  Administered 2011-04-16: 2 mg via INTRAVENOUS

## 2011-04-16 MED ORDER — DEXAMETHASONE SODIUM PHOSPHATE 10 MG/ML IJ SOLN
10.0000 mg | Freq: Once | INTRAMUSCULAR | Status: AC
  Administered 2011-04-16: 10 mg via INTRAVENOUS
  Filled 2011-04-16: qty 1

## 2011-04-16 MED ORDER — CEFAZOLIN SODIUM 1-5 GM-% IV SOLN
INTRAVENOUS | Status: AC
  Administered 2011-04-16: 1 g via INTRAVENOUS
  Filled 2011-04-16: qty 50

## 2011-04-16 MED ORDER — POTASSIUM CHLORIDE CRYS ER 20 MEQ PO TBCR
20.0000 meq | EXTENDED_RELEASE_TABLET | Freq: Every day | ORAL | Status: DC
  Administered 2011-04-17 – 2011-04-21 (×5): 20 meq via ORAL
  Filled 2011-04-16 (×5): qty 1

## 2011-04-16 MED ORDER — ROCURONIUM BROMIDE 100 MG/10ML IV SOLN
INTRAVENOUS | Status: DC | PRN
  Administered 2011-04-16: 10 mg via INTRAVENOUS
  Administered 2011-04-16: 50 mg via INTRAVENOUS

## 2011-04-16 MED ORDER — PROMETHAZINE HCL 25 MG/ML IJ SOLN: 6.2500 mg | INTRAMUSCULAR | Status: DC | PRN

## 2011-04-16 MED ORDER — MENTHOL 3 MG MT LOZG
1.0000 | LOZENGE | OROMUCOSAL | Status: DC | PRN
  Filled 2011-04-16: qty 9

## 2011-04-16 MED ORDER — ONDANSETRON HCL 4 MG/2ML IJ SOLN: 4.0000 mg | INTRAMUSCULAR | Status: DC | PRN

## 2011-04-16 MED ORDER — BACLOFEN 20 MG PO TABS
20.0000 mg | ORAL_TABLET | Freq: Four times a day (QID) | ORAL | Status: DC
  Administered 2011-04-16 – 2011-04-21 (×18): 20 mg via ORAL
  Filled 2011-04-16 (×22): qty 1

## 2011-04-16 MED ORDER — SIMVASTATIN 20 MG PO TABS: 20.0000 mg | ORAL_TABLET | Freq: Every day | ORAL | Status: DC

## 2011-04-16 SURGICAL SUPPLY — 62 items
APL SKNCLS STERI-STRIP NONHPOA (GAUZE/BANDAGES/DRESSINGS) ×1
BAG DECANTER FOR FLEXI CONT (MISCELLANEOUS) ×2 IMPLANT
BENZOIN TINCTURE PRP APPL 2/3 (GAUZE/BANDAGES/DRESSINGS) ×2 IMPLANT
BLADE SURG ROTATE 9660 (MISCELLANEOUS) IMPLANT
BUR MATCHSTICK NEURO 3.0 LAGG (BURR) ×2 IMPLANT
CANISTER SUCTION 2500CC (MISCELLANEOUS) ×2 IMPLANT
CLOSURE STERI STRIP 1/2 X4 (GAUZE/BANDAGES/DRESSINGS) ×2 IMPLANT
CLOTH BEACON ORANGE TIMEOUT ST (SAFETY) ×2 IMPLANT
CONT SPEC 4OZ CLIKSEAL STRL BL (MISCELLANEOUS) ×6 IMPLANT
COVER BACK TABLE 24X17X13 BIG (DRAPES) ×1 IMPLANT
COVER TABLE BACK 60X90 (DRAPES) IMPLANT
DRAPE C-ARM 42X72 X-RAY (DRAPES) ×6 IMPLANT
DRAPE LAPAROTOMY 100X72X124 (DRAPES) ×2 IMPLANT
DRAPE POUCH INSTRU U-SHP 10X18 (DRAPES) ×2 IMPLANT
DRAPE SURG 17X23 STRL (DRAPES) ×2 IMPLANT
DRESSING TELFA 8X3 (GAUZE/BANDAGES/DRESSINGS) ×2 IMPLANT
DRSG OPSITE 4X5.5 SM (GAUZE/BANDAGES/DRESSINGS) ×3 IMPLANT
DURAPREP 26ML APPLICATOR (WOUND CARE) ×2 IMPLANT
ELECT REM PT RETURN 9FT ADLT (ELECTROSURGICAL) ×2
ELECTRODE REM PT RTRN 9FT ADLT (ELECTROSURGICAL) ×1 IMPLANT
EVACUATOR 1/8 PVC DRAIN (DRAIN) ×2 IMPLANT
GAUZE SPONGE 4X4 16PLY XRAY LF (GAUZE/BANDAGES/DRESSINGS) IMPLANT
GLOVE BIO SURGEON STRL SZ8 (GLOVE) ×4 IMPLANT
GLOVE BIOGEL PI IND STRL 7.5 (GLOVE) IMPLANT
GLOVE BIOGEL PI INDICATOR 7.5 (GLOVE) ×3
GLOVE ECLIPSE 6.5 STRL STRAW (GLOVE) IMPLANT
GLOVE ECLIPSE 7.5 STRL STRAW (GLOVE) ×1 IMPLANT
GLOVE INDICATOR 7.0 STRL GRN (GLOVE) ×1 IMPLANT
GLOVE SURG SS PI 6.5 STRL IVOR (GLOVE) ×3 IMPLANT
GOWN BRE IMP SLV AUR LG STRL (GOWN DISPOSABLE) ×1 IMPLANT
GOWN BRE IMP SLV AUR XL STRL (GOWN DISPOSABLE) ×5 IMPLANT
GOWN STRL REIN 2XL LVL4 (GOWN DISPOSABLE) IMPLANT
GRAFT BN 5X1XSPNE CVD POST DBM (Bone Implant) IMPLANT
GRAFT BONE MAGNIFUSE 1X5CM (Bone Implant) ×2 IMPLANT
HEMOSTAT POWDER KIT SURGIFOAM (HEMOSTASIS) ×2 IMPLANT
KIT BASIN OR (CUSTOM PROCEDURE TRAY) ×2 IMPLANT
KIT ROOM TURNOVER OR (KITS) ×2 IMPLANT
MILL MEDIUM DISP (BLADE) ×1 IMPLANT
NDL HYPO 25X1 1.5 SAFETY (NEEDLE) ×1 IMPLANT
NEEDLE HYPO 25X1 1.5 SAFETY (NEEDLE) ×2 IMPLANT
NS IRRIG 1000ML POUR BTL (IV SOLUTION) ×2 IMPLANT
PACK LAMINECTOMY NEURO (CUSTOM PROCEDURE TRAY) ×2 IMPLANT
PAD ARMBOARD 7.5X6 YLW CONV (MISCELLANEOUS) ×6 IMPLANT
PUTTY BONE DBX 5CC MIX (Putty) ×1 IMPLANT
ROD EXPEDIUM PREBENT 5.5X30MM (Rod) ×2 IMPLANT
ROD EXPEDIUM PREBENT 5.5X35MM (Rod) ×1 IMPLANT
SCREW EXPEDIUM POLYAXIAL 6X40 (Screw) ×8 IMPLANT
SCREW SET SINGLE INNER (Screw) ×4 IMPLANT
SPONGE LAP 4X18 X RAY DECT (DISPOSABLE) IMPLANT
SPONGE SURGIFOAM ABS GEL 100 (HEMOSTASIS) ×2 IMPLANT
STRIP CLOSURE SKIN 1/2X4 (GAUZE/BANDAGES/DRESSINGS) ×4 IMPLANT
SUT VIC AB 0 CT1 18XCR BRD8 (SUTURE) ×1 IMPLANT
SUT VIC AB 0 CT1 8-18 (SUTURE) ×2
SUT VIC AB 2-0 CP2 18 (SUTURE) ×2 IMPLANT
SUT VIC AB 3-0 SH 8-18 (SUTURE) ×4 IMPLANT
SYR 20ML ECCENTRIC (SYRINGE) ×2 IMPLANT
TELAMON 22X10 (Cage) ×4 IMPLANT
TOWEL OR 17X24 6PK STRL BLUE (TOWEL DISPOSABLE) ×2 IMPLANT
TOWEL OR 17X26 10 PK STRL BLUE (TOWEL DISPOSABLE) ×2 IMPLANT
TRAP SPECIMEN MUCOUS 40CC (MISCELLANEOUS) IMPLANT
TRAY FOLEY CATH 14FRSI W/METER (CATHETERS) ×2 IMPLANT
WATER STERILE IRR 1000ML POUR (IV SOLUTION) ×2 IMPLANT

## 2011-04-16 NOTE — Anesthesia Preprocedure Evaluation (Addendum)
Anesthesia Evaluation  Patient identified by MRN, date of birth, ID band Patient awake, Patient confused and Patient unresponsive    Reviewed: Allergy & Precautions, H&P , NPO status , Patient's Chart, lab work & pertinent test results, reviewed documented beta blocker date and time   Airway Mallampati: II  Neck ROM: Full    Dental  (+) Teeth Intact and Poor Dentition   Pulmonary neg pulmonary ROS,  clear to auscultation        Cardiovascular hypertension, Pt. on medications + Valvular Problems/Murmurs Regular Normal    Neuro/Psych  Headaches, Multiple sclerosis  Neuromuscular disease    GI/Hepatic hiatal hernia, GERD-  ,  Endo/Other    Renal/GU      Musculoskeletal  (+) Arthritis -,   Abdominal (+) obese,   Peds  Hematology   Anesthesia Other Findings   Reproductive/Obstetrics                         Anesthesia Physical Anesthesia Plan  ASA: II  Anesthesia Plan: General   Post-op Pain Management:    Induction: Intravenous  Airway Management Planned: Oral ETT  Additional Equipment:   Intra-op Plan:   Post-operative Plan: Extubation in OR  Informed Consent: I have reviewed the patients History and Physical, chart, labs and discussed the procedure including the risks, benefits and alternatives for the proposed anesthesia with the patient or authorized representative who has indicated his/her understanding and acceptance.   Dental advisory given  Plan Discussed with: CRNA and Surgeon  Anesthesia Plan Comments:         Anesthesia Quick Evaluation

## 2011-04-16 NOTE — Transfer of Care (Signed)
Immediate Anesthesia Transfer of Care Note  Patient: [name]  Procedure(s) Performed:  POSTERIOR LUMBAR FUSION 1 LEVEL - posterior lumbar interbody fusion lumbar four-five  Patient Location: PACU  Anesthesia Type: General  Level of Consciousness: awake  Airway & Oxygen Therapy: Patient Spontanous Breathing and Patient connected to face mask oxygen  Post-op Assessment: Report given to PACU RN, Post -op Vital signs reviewed and stable and Patient moving all extremities  Post vital signs: Reviewed and stable  Complications: No apparent anesthesia complications

## 2011-04-16 NOTE — H&P (Signed)
Subjective: Patient is a 56 y.o. female admitted for PLIF L4-5. Onset of symptoms was several months ago, gradually worsening since that time.  The pain is rated severe, and is located at the waist and radiates to legs. The pain is described as throbbing and occurs intermittently. The symptoms have been progressive. Symptoms are exacerbated by exercise, extension and walking for more than a few minutes. MRI or CT showed stenosis and spondylolisthesis L4-5.   Past Medical History  Diagnosis Date  . Complication of anesthesia     BP RISES WHEN AWAKENING  . Heart murmur   . Chronic kidney disease     UTI'S  . Hypertension   . GERD (gastroesophageal reflux disease)   . Headache   . Arthritis   . Anemia     HX  . H/O hiatal hernia   . Neuromuscular disorder     MS    Past Surgical History  Procedure Date  . Uterine fibroid embolization   . Eye surgery     BLEPHAROPLASTY  . Cholecystectomy     Prior to Admission medications   Medication Sig Start Date End Date Taking? Authorizing Provider  amLODipine (NORVASC) 10 MG tablet Take 10 mg by mouth daily.     Yes Historical Provider, MD  atorvastatin (LIPITOR) 20 MG tablet Take 20 mg by mouth daily.     Yes Historical Provider, MD  baclofen (LIORESAL) 20 MG tablet Take 20 mg by mouth 4 (four) times daily.     Yes Historical Provider, MD  CALCIUM-VITAMIN D PO Take 1 tablet by mouth daily.     Yes Historical Provider, MD  dexlansoprazole (DEXILANT) 60 MG capsule Take 60 mg by mouth daily.     Yes Historical Provider, MD  fluticasone (FLONASE) 50 MCG/ACT nasal spray Place 2 sprays into the nose 2 (two) times daily as needed. For stuffy nose.    Yes Historical Provider, MD  gabapentin (NEURONTIN) 100 MG capsule Take 200 mg by mouth 3 (three) times daily.     Yes Historical Provider, MD  gabapentin (NEURONTIN) 300 MG capsule Take 300 mg by mouth. TAKE 1 TAB FOR SEVERE HEADACHES   Yes Historical Provider, MD  interferon beta-1b (BETASERON) 0.3  MG injection Inject 0.25 mg into the skin every other day.     Yes Historical Provider, MD  lisinopril-hydrochlorothiazide (PRINZIDE,ZESTORETIC) 20-25 MG per tablet Take 1 tablet by mouth daily.     Yes Historical Provider, MD  meclizine (ANTIVERT) 25 MG tablet Take 25 mg by mouth 3 (three) times daily as needed. FOR DIZZINESS   Yes Historical Provider, MD  montelukast (SINGULAIR) 10 MG tablet Take 10 mg by mouth at bedtime.     Yes Historical Provider, MD  Multiple Vitamins-Minerals (MULTIVITAMINS THER. W/MINERALS) TABS Take 1 tablet by mouth daily.     Yes Historical Provider, MD  Omega-3 Fatty Acids (FISH OIL) 1200 MG CAPS Take 1 capsule by mouth 2 (two) times daily.     Yes Historical Provider, MD  potassium chloride SA (K-DUR,KLOR-CON) 20 MEQ tablet Take 20 mEq by mouth daily.     Yes Historical Provider, MD  sertraline (ZOLOFT) 100 MG tablet Take 150-200 mg by mouth daily. Depending on mood pt takes 150-200 mg daily.    Yes Historical Provider, MD  traZODone (DESYREL) 100 MG tablet Take 100 mg by mouth at bedtime.     Yes Historical Provider, MD  Oxycodone HCl 10 MG TABS Take 10 mg by mouth every 4 (four) hours as  needed. For pain.     Historical Provider, MD  traMADol (ULTRAM) 50 MG tablet Take 50 mg by mouth every 6 (six) hours as needed.    Historical Provider, MD   Allergies  Allergen Reactions  . Other Other (See Comments)    Pt states she took a medication 2-3 yrs ago that may have been for inflammation and it caused her to be weak.    History  Substance Use Topics  . Smoking status: Former Smoker -- 2.0 packs/day    Quit date: 04/08/1996  . Smokeless tobacco: Not on file   Comment: ALCOHOL IN PAST  . Alcohol Use: No    History reviewed. No pertinent family history.   Review of Systems  Positive ROS: Negative  All other systems have been reviewed and were otherwise negative with the exception of those mentioned in the HPI and as above.  Objective: Vital signs in last 24  hours: Temp:  [98.3 F (36.8 C)] 98.3 F (36.8 C) (01/23 0937) Pulse Rate:  [55] 55  (01/23 0937) Resp:  [18] 18  (01/23 0937) BP: (165)/(95) 165/95 mmHg (01/23 0937) SpO2:  [98 %] 98 % (01/23 0937)  General Appearance: Alert, cooperative, no distress, appears stated age Head: Normocephalic, without obvious abnormality, atraumatic Eyes: PERRL, conjunctiva/corneas clear, EOM's intact, fundi benign, both eyes      Ears: Normal TM's and external ear canals, both ears Throat: Lips, mucosa, and tongue normal; teeth and gums normal Neck: Supple, symmetrical, trachea midline, no adenopathy; thyroid: No enlargement/tenderness/nodules; no carotid bruit or JVD Back: Symmetric, no curvature, ROM normal, no CVA tenderness Lungs: Clear to auscultation bilaterally, respirations unlabored Heart: Regular rate and rhythm, S1 and S2 normal, no murmur, rub or gallop Abdomen: Soft, non-tender, bowel sounds active all four quadrants, no masses, no organomegaly Extremities: Extremities normal, atraumatic, no cyanosis or edema Pulses: 2+ and symmetric all extremities Skin: Skin color, texture, turgor normal, no rashes or lesions  NEUROLOGIC:   Mental status: Alert and oriented x4,  no aphasia, good attention span, fund of knowledge, and memory Motor Exam - grossly normal Sensory Exam - grossly normal Reflexes: Decreased Coordination - grossly normal Gait - grossly normal Balance - grossly normal Cranial Nerves: I: smell Not tested  II: visual acuity  OS: nl    OD: nl  II: visual fields Full to confrontation  II: pupils Equal, round, reactive to light  III,VII: ptosis None  III,IV,VI: extraocular muscles  Full ROM  V: mastication Normal  V: facial light touch sensation  Normal  V,VII: corneal reflex  Present  VII: facial muscle function - upper  Normal  VII: facial muscle function - lower Normal  VIII: hearing Not tested  IX: soft palate elevation  Normal  IX,X: gag reflex Present  XI:  trapezius strength  5/5  XI: sternocleidomastoid strength 5/5  XI: neck flexion strength  5/5  XII: tongue strength  Normal    Data Review Lab Results  Component Value Date   WBC 4.5 04/09/2011   HGB 12.2 04/09/2011   HCT 37.2 04/09/2011   MCV 84.4 04/09/2011   PLT 199 04/09/2011   Lab Results  Component Value Date   NA 140 04/09/2011   K 3.8 04/09/2011   CL 100 04/09/2011   CO2 32 04/09/2011   BUN 17 04/09/2011   CREATININE 0.70 04/09/2011   GLUCOSE 93 04/09/2011   Lab Results  Component Value Date   INR 0.93 04/09/2011    Assessment/Plan: Patient admitted for lumbar  spinal stenosis with spondylolisthesis L4-5. Patient has failed conservative therapy. Plan is for PLIF L4-5.  I explained the condition and procedure to the patient and answered any questions.  Patient wishes to proceed with procedure as planned. Understands risks/ benefits and typical outcomes of procedure.    Jon S 04/16/2011 11:25 AM

## 2011-04-16 NOTE — Progress Notes (Signed)
Had to have consent signed DOS.

## 2011-04-16 NOTE — Progress Notes (Signed)
Patient ID: [name], female   DOB: March 14, 1956, 56 y.o.   MRN: [id number]   Pt looks good post-op. Good strength. No leg pain or N/T. Appropriate back soreness.

## 2011-04-16 NOTE — Op Note (Signed)
04/16/2011  2:57 PM  PATIENT:  [name]  56 y.o. female  PRE-OPERATIVE DIAGNOSIS:  Lumbar spondylosis with lumbar spinal stenosis and spondylolisthesis L4-5 with back and leg pain  POST-OPERATIVE DIAGNOSIS:  Same  PROCEDURE:  1. Intertransverse arthrodesis L4-5 utilizing local autograft and morcellized allograft, 2. Posterior lumbar interbody fusion L4-5 utilizing peek interbody cages packed with local autograft and morcellized allograft, 3. Decompressive Gill-type laminectomy, hemi-facetectomy and foraminotomies L4-5 decompression of L4 and L5 nerve roots requiring much more work than would be required of a simple PLIF exposure, 4.  Non-segmental fixation 45 utilizing depuy pedicle screws  SURGEON:  Marikay Alar, MD  ASSISTANTS: Dr. Newell Coral  ANESTHESIA:   General  EBL: 200 ml  Total I/O In: 2000 [I.V.:2000] Out: 350 [Urine:200; Blood:150]  BLOOD ADMINISTERED:none  DRAINS: Medium Hemovac   SPECIMEN:  No Specimen  INDICATION FOR PROCEDURE: This patient presented with back pain and bilateral leg pain especially with ambulation. It was very severe. MRI showed spondylolisthesis with severe spinal stenosis at L4-5. She tried medical management for quite some time without significant relief. Recommended posterior lumbar interbody fusion L4-5 to address her segmental instability as well as her spinal stenosis.  Patient understood the risks, benefits, and alternatives and potential outcomes and wished to proceed.    PROCEDURE DETAILS:  The patient was brought to the operating room. After induction of generalized endotracheal anesthesia the patient was rolled into the prone position on chest rolls and all pressure points were padded. The patient's lumbar region was cleaned and then prepped with DuraPrep and draped in the usual sterile fashion. Anesthesia was injected and then a dorsal midline incision was made and carried down to the lumbosacral fascia. The fascia was opened and the  paraspinous musculature was taken down in a subperiosteal fashion to expose L4-5. Intraoperative fluoroscopy confirmed my level, and the spinous process was removed and complete lumbar laminectomies, hemi- facetectomies, and foraminotomies were performed at L4-5. The yellow ligament was removed to expose the underlying dura and nerve roots, and generous foraminotomies were performed to adequately decompress the neural elements. This required more work than would be required of a simple PLIF exposure.  Once the decompression was complete, I turned my attention to the posterior lower lumbar interbody fusion. The epidural venous vasculature was coagulated and cut sharply. Disc space was incised and the initial discectomy was performed with pituitary rongeurs. The disc space was distracted with sequential distractors to a height of 10 mm. We then used a series of scrapers and shavers to prepare the endplates for fusion. The midline was prepared with Epstein curettes. Once the complete discectomy was finished, we packed an appropriate sized peek interbody cage with local autograft and morcellized allograft, gently retracted the nerve root, and tapped the cage into position at L4-5. We also tapped a  cage into the opposite side. The midline was packed with morselized autograft and allograft. We then turned our attention to the posterior fixation. The pedicle screw entry zones were identified utilizing surface landmarks and fluoroscopy. We probed each pedicle with the pedicle probe and tapped each pedicle with the appropriate tap. We palpated with a ball probe to assure no break in the cortex. We then placed 6 x 40 mm screws into the pedicles bilaterally at L4-5. We then decorticated the transverse processes and laid a mixture of morcellized autograft and allograft out over these to perform intertransverse arthrodesis at L4-5. We then placed lordotic rods into the multiaxial screw heads of the pedicle screws  and locked  these in position with the locking caps and anti-torque device. We achieved compression of our grafts. We then checked our construct with AP and lateral fluoroscopy. Irrigated with copious amounts of bacitracin-containing saline solution. Placed a medium Hemovac drain through separate stab incision. Inspected the nerve roots once again to assure adequate decompression, lined to the dura with Gelfoam, and closed the muscle and the fascia with 0 Vicryl. Closed the subcutaneous tissues with 2-0 Vicryl and subcuticular tissues with 3-0 Vicryl. The skin was closed with benzoin and Steri-Strips. Dressing was then applied, the patient was awakened from general anesthesia and transported to the recovery room in stable condition. At the end of the procedure all sponge, needle and instrument counts were correct.   PLAN OF CARE: Admit to inpatient   PATIENT DISPOSITION:  PACU - hemodynamically stable.   Delay start of Pharmacological VTE agent (>24hrs) due to surgical blood loss or risk of bleeding:  yes

## 2011-04-16 NOTE — Preoperative (Signed)
Beta Blockers   Reason not to administer Beta Blockers:Not Applicable 

## 2011-04-16 NOTE — Anesthesia Postprocedure Evaluation (Signed)
  Anesthesia Post-op Note  Patient: [name]  Procedure(s) Performed:  POSTERIOR LUMBAR FUSION 1 LEVEL - posterior lumbar interbody fusion lumbar four-five  Patient Location: PACU  Anesthesia Type: General  Level of Consciousness: sedated  Airway and Oxygen Therapy: Patient Spontanous Breathing  Post-op Pain: mild  Post-op Assessment: Post-op Vital signs reviewed  Post-op Vital Signs: stable  Complications: No apparent anesthesia complications

## 2011-04-17 ENCOUNTER — Inpatient Hospital Stay (HOSPITAL_COMMUNITY): Payer: Medicare Other

## 2011-04-17 ENCOUNTER — Encounter (HOSPITAL_COMMUNITY): Payer: Self-pay | Admitting: General Practice

## 2011-04-17 MED ORDER — DIPHENHYDRAMINE HCL 12.5 MG/5ML PO ELIX
12.5000 mg | ORAL_SOLUTION | Freq: Four times a day (QID) | ORAL | Status: DC | PRN
  Administered 2011-04-19: 12.5 mg via ORAL
  Filled 2011-04-17: qty 10

## 2011-04-17 MED ORDER — NALOXONE HCL 0.4 MG/ML IJ SOLN: 0.4000 mg | INTRAMUSCULAR | Status: DC | PRN

## 2011-04-17 MED ORDER — DIAZEPAM 5 MG PO TABS
5.0000 mg | ORAL_TABLET | Freq: Four times a day (QID) | ORAL | Status: DC | PRN
  Administered 2011-04-17 – 2011-04-21 (×6): 5 mg via ORAL
  Filled 2011-04-17 (×6): qty 1

## 2011-04-17 MED ORDER — SODIUM CHLORIDE 0.9 % IJ SOLN: 9.0000 mL | INTRAMUSCULAR | Status: DC | PRN

## 2011-04-17 MED ORDER — DIPHENHYDRAMINE HCL 50 MG/ML IJ SOLN: 12.5000 mg | Freq: Four times a day (QID) | INTRAMUSCULAR | Status: DC | PRN

## 2011-04-17 MED ORDER — INTERFERON BETA-1B 0.3 MG ~~LOC~~ SOLR
0.2500 mg | SUBCUTANEOUS | Status: DC
  Administered 2011-04-17 – 2011-04-19 (×2): 0.25 mg via SUBCUTANEOUS
  Filled 2011-04-17: qty 0.3

## 2011-04-17 MED ORDER — ONDANSETRON HCL 4 MG/2ML IJ SOLN
4.0000 mg | Freq: Four times a day (QID) | INTRAMUSCULAR | Status: DC | PRN
Start: 2011-04-17 — End: 2011-04-21

## 2011-04-17 MED ORDER — HYDROMORPHONE 0.3 MG/ML IV SOLN
INTRAVENOUS | Status: DC
  Administered 2011-04-17: 18:00:00 via INTRAVENOUS

## 2011-04-17 NOTE — Progress Notes (Signed)
Physical Therapy Evaluation Patient Details Name: [name] MRN: [id number] DOB: Oct 28, 1955 Today's Date: 04/17/2011  Problem List: There is no problem list on file for this patient.   Past Medical History:  Past Medical History  Diagnosis Date  . Complication of anesthesia     BP RISES WHEN AWAKENING  . Heart murmur   . Chronic kidney disease     UTI'S  . Hypertension   . GERD (gastroesophageal reflux disease)   . Headache   . Arthritis   . Anemia     HX  . H/O hiatal hernia   . Neuromuscular disorder     MS  . Lumbar spondylolysis   . Lumbar spinal stenosis    Past Surgical History:  Past Surgical History  Procedure Date  . Uterine fibroid embolization   . Eye surgery     BLEPHAROPLASTY  . Cholecystectomy   . Posterior fusion lumbar spine 04/16/11    L4-5    PT Assessment/Plan/Recommendation PT Assessment Clinical Impression Statement: Pt presents with a medical diagnosis of PLIF L4-5 along with the following impairments/deficits and therapy diagnosis listed below. Pt also with anxiety and discomfort with mobility, requesting therapy be continued another day. Pt will benefit from skilled PT in the acute care setting in order to maximize functional mobility for a safe d/c home as pt does not have supervision or assist at home PT Recommendation/Assessment: Patient will need skilled PT in the acute care venue PT Problem List: Decreased activity tolerance;Decreased mobility;Decreased knowledge of use of DME;Decreased knowledge of precautions;Pain Barriers to Discharge: Decreased caregiver support PT Therapy Diagnosis : Acute pain;Difficulty walking PT Plan PT Frequency: Min 5X/week PT Treatment/Interventions: DME instruction;Gait training;Stair training;Functional mobility training;Therapeutic activities;Therapeutic exercise;Patient/family education PT Recommendation Follow Up Recommendations: Home health PT;Supervision - Intermittent Equipment Recommended: None  recommended by PT PT Goals  Acute Rehab PT Goals PT Goal Formulation: With patient Time For Goal Achievement: 7 days Pt will go Supine/Side to Sit: with modified independence PT Goal: Supine/Side to Sit - Progress: Goal set today Pt will go Sit to Stand: with modified independence PT Goal: Sit to Stand - Progress: Goal set today Pt will go Stand to Sit: with modified independence PT Goal: Stand to Sit - Progress: Goal set today Pt will Transfer Bed to Chair/Chair to Bed: with modified independence PT Transfer Goal: Bed to Chair/Chair to Bed - Progress: Goal set today Pt will Ambulate: 51 - 150 feet;with modified independence;with least restrictive assistive device PT Goal: Ambulate - Progress: Goal set today  PT Evaluation Precautions/Restrictions  Precautions Precautions: Back Required Braces or Orthoses: Yes Spinal Brace: Lumbar corset;Applied in sitting position Restrictions Weight Bearing Restrictions: No Prior Functioning  Home Living Lives With: Alone Receives Help From:  (no family to assist upon dc) Type of Home: Apartment Home Layout: One level Home Access: Level entry Bathroom Shower/Tub: Tub/shower unit;Curtain Bathroom Toilet: Standard Home Adaptive Equipment: Straight cane;Grab bars in shower;Grab bars around toilet (rollator) Prior Function Level of Independence: Independent with basic ADLs;Independent with homemaking with ambulation;Needs assistance with gait;Needs assistance with tranfers Able to Take Stairs?: No Driving: Yes Vocation: Retired Producer, television/film/video: Awake/alert Overall Cognitive Status: Appears within functional limits for tasks assessed Orientation Level: Oriented X4 Sensation/Coordination Sensation Light Touch: Appears Intact Extremity Assessment RLE Assessment RLE Assessment: Within Functional Limits LLE Assessment LLE Assessment: Within Functional Limits Mobility (including Balance) Bed Mobility Bed Mobility:  Yes Sit to Supine: 4: Min assist;HOB elevated (comment degrees);With rail (30) Sit to Supine - Details (  indicate cue type and reason): VC for hand placement and technique. Min assist with LEs into bed Transfers Transfers: Yes Sit to Stand: 4: Min assist;With upper extremity assist;From chair/3-in-1 Sit to Stand Details (indicate cue type and reason): VC for hand placement and sequencing as well as safety to RW Stand to Sit: 4: Min assist;To bed;With upper extremity assist;To chair/3-in-1 Stand to Sit Details: VC for hand placement and technique to maintain back precautions.  Ambulation/Gait Ambulation/Gait: Yes Ambulation/Gait Assistance: 4: Min assist Ambulation/Gait Assistance Details (indicate cue type and reason): VC for technique and safety with rollator(pt used her own). Pt refused long ambulation Ambulation Distance (Feet): 5 Feet Assistive device: Rollator Gait Pattern: Step-to pattern;Decreased stride length;Decreased hip/knee flexion - right;Decreased hip/knee flexion - left Gait velocity: Decreased gait speed Stairs: No    Exercise    End of Session PT - End of Session Equipment Utilized During Treatment: Gait belt;Back brace Activity Tolerance: Patient limited by fatigue;Patient limited by pain Patient left: in bed;with call bell in reach Nurse Communication: Mobility status for transfers;Mobility status for ambulation General Behavior During Session: Agitated (Pt expressed anxiety) Cognition: WFL for tasks performed  Milana Kidney 04/17/2011, 10:21 AM  04/17/2011 Milana Kidney DPT PAGER: [phone] OFFICE: [phone]

## 2011-04-17 NOTE — Progress Notes (Signed)
Occupational Therapy Evaluation Patient Details Name: [name] MRN: [id number] DOB: [birth year] Today's Date: 04/17/2011  Problem List: There is no problem list on file for this patient.   Past Medical History:  Past Medical History  Diagnosis Date  . Complication of anesthesia     BP RISES WHEN AWAKENING  . Heart murmur   . Chronic kidney disease     UTI'S  . Hypertension   . GERD (gastroesophageal reflux disease)   . Headache   . Arthritis   . Anemia     HX  . H/O hiatal hernia   . Neuromuscular disorder     MS  . Lumbar spondylolysis   . Lumbar spinal stenosis    Past Surgical History:  Past Surgical History  Procedure Date  . Uterine fibroid embolization   . Eye surgery     BLEPHAROPLASTY  . Cholecystectomy   . Posterior fusion lumbar spine 04/16/11    L4-5    OT Assessment/Plan/Recommendation OT Assessment Clinical Impression Statement: Pt s/p PLIF L4-5 and demonstrates with decreased I with functional tranfsers and ADLs thus affecting PLOF at I level.  Pt performed stand pivot transfer with min assist but reported experiencing great discomfort and anxiety thoughout session.  Pt will benefit from AE education.  Will follow acutely to increase I with functional transfers and ADLs in prep for d/c home at mod I level. OT Recommendation/Assessment: Patient will need skilled OT in the acute care venue OT Problem List: Decreased activity tolerance;Decreased knowledge of use of DME or AE;Decreased knowledge of precautions;Pain OT Therapy Diagnosis : Acute pain OT Plan OT Frequency: Min 2X/week OT Treatment/Interventions: Self-care/ADL training;DME and/or AE instruction;Therapeutic activities;Patient/family education OT Recommendation Follow Up Recommendations: Home health OT;Supervision - Intermittent Equipment Recommended: 3 in 1 bedside comode (To be determined if pt needs tub bench) Individuals Consulted Consulted and Agree with Results and Recommendations:  Patient OT Goals Acute Rehab OT Goals OT Goal Formulation: With patient Time For Goal Achievement: 7 days ADL Goals Pt Will Perform Grooming: with modified independence;Standing at sink ADL Goal: Grooming - Progress: Goal set today Pt Will Perform Lower Body Bathing: with modified independence;Sit to stand from chair;Sit to stand from bed;with adaptive equipment ADL Goal: Lower Body Bathing - Progress: Goal set today Pt Will Perform Lower Body Dressing: with modified independence;Sit to stand from chair;Sit to stand from bed;with adaptive equipment ADL Goal: Lower Body Dressing - Progress: Goal set today Pt Will Transfer to Toilet: with modified independence;with DME;3-in-1;Ambulation;Maintaining back safety precautions ADL Goal: Toilet Transfer - Progress: Goal set today Pt Will Perform Tub/Shower Transfer: Tub transfer;with modified independence;Ambulation;with DME;Maintaining back safety precautions;Shower seat with back;Transfer tub bench (DME to be determined) ADL Goal: Tub/Shower Transfer - Progress: Goal set today Miscellaneous OT Goals Miscellaneous OT Goal #1: Pt will perform bed mobility with HOB flat with mod I in prep for EOB ADLs. OT Goal: Miscellaneous Goal #1 - Progress: Goal set today Miscellaneous OT Goal #2: Pt will don/doff back brace with mod I sitting EOB. OT Goal: Miscellaneous Goal #2 - Progress: Goal set today  OT Evaluation Precautions/Restrictions  Precautions Precautions: Back Required Braces or Orthoses: Yes Spinal Brace: Lumbar corset;Applied in sitting position Restrictions Weight Bearing Restrictions: No Prior Functioning Home Living Lives With: Alone Receives Help From:  (no family to assist upon dc) Type of Home: Apartment Home Layout: One level Home Access: Level entry Bathroom Shower/Tub: Tub/shower unit;Curtain Bathroom Toilet: Standard Home Adaptive Equipment: Straight cane;Grab bars in shower;Grab bars around toilet (  rollator) Prior  Function Level of Independence: Independent with basic ADLs;Independent with homemaking with ambulation;Needs assistance with gait;Needs assistance with tranfers Able to Take Stairs?: No Driving: Yes Vocation: Retired ADL ADL Grooming: Simulated;Set up Where Assessed - Grooming: Sitting, chair Toilet Transfer: Performed;Minimal assistance Toilet Transfer Details (indicate cue type and reason): Min assist for maneuvering rollator; VC for sequencing Toilet Transfer Method: Stand pivot Toilet Transfer Equipment: Bedside commode Toileting - Clothing Manipulation: Performed;Minimal assistance;Other (comment) (min guard) Toileting - Clothing Manipulation Details (indicate cue type and reason): Min guard for safety and balance when pulling gown up Where Assessed - Toileting Clothing Manipulation: Standing Toileting - Hygiene: Performed;Minimal assistance;Other (comment) (min guard) Toileting - Hygiene Details (indicate cue type and reason): Min guard for safety and steadying  Where Assessed - Toileting Hygiene: Standing Equipment Used: Other (comment) (rollator) ADL Comments: Pt donned back brace with max assist sitting in chair. Pt will require mod-max assist with LB dressing due to back precautions.  Vision/Perception    Cognition Cognition Arousal/Alertness: Awake/alert Overall Cognitive Status: Appears within functional limits for tasks assessed Orientation Level: Oriented X4 Sensation/Coordination Sensation Light Touch: Appears Intact Extremity Assessment RUE Assessment RUE Assessment: Within Functional Limits LUE Assessment LUE Assessment: Within Functional Limits Mobility  Bed Mobility Bed Mobility: Yes Sit to Supine: 4: Min assist;HOB elevated (comment degrees);With rail (30) Sit to Supine - Details (indicate cue type and reason): VC for hand placement and technique. Min assist with LEs into bed Transfers Sit to Stand: 4: Min assist;With upper extremity assist;From  chair/3-in-1 Sit to Stand Details (indicate cue type and reason): VC for hand placement and sequencing as well as safety to RW Stand to Sit: 4: Min assist;To bed;With upper extremity assist;To chair/3-in-1 Stand to Sit Details: VC for hand placement and technique to maintain back precautions.  Exercises   End of Session OT - End of Session Equipment Utilized During Treatment: Gait belt;Back brace Activity Tolerance: Patient limited by fatigue (anxious) Patient left: in bed;with call bell in reach Nurse Communication: Mobility status for transfers General Behavior During Session: Agitated (Pt reported feeling anxious) Cognition: WFL for tasks performed   [name] 04/17/2011, 10:46 AM  04/17/2011 [name] OTR/L Pager [phone] Office [phone]

## 2011-04-17 NOTE — Progress Notes (Signed)
UR COMPLETED  

## 2011-04-17 NOTE — Progress Notes (Signed)
Patient ID: [name], female   DOB: November 20, 1955, 56 y.o.   MRN: [id number] Subjective: Patient reports significant postoperative back pain, legs have some mild pain but nothing like preop, denies significant numbness or tingling, states she feels weak all over. Difficulty with urinary continence continue similar to preop.  Objective: Vital signs in last 24 hours: Temp:  [97.3 F (36.3 C)-98.6 F (37 C)] 98.3 F (36.8 C) (01/24 1044) Pulse Rate:  [69-90] 77  (01/24 1044) Resp:  [12-20] 20  (01/24 1044) BP: (146-187)/(70-94) 164/84 mmHg (01/24 1044) SpO2:  [90 %-96 %] 90 % (01/24 1044)  Intake/Output from previous day: 01/23 0701 - 01/24 0700 In: 4043 [P.O.:640; I.V.:2828; IV Piggyback:100] Out: 350 [Urine:200; Blood:150] Intake/Output this shift:    Wound: Seems to have good strength in lower extremities, and arise from a seated position, and it appears similar to preop  Lab Results: Lab Results  Component Value Date   WBC 4.5 04/09/2011   HGB 12.2 04/09/2011   HCT 37.2 04/09/2011   MCV 84.4 04/09/2011   PLT 199 04/09/2011   Lab Results  Component Value Date   INR 0.93 04/09/2011   BMET Lab Results  Component Value Date   NA 140 04/09/2011   K 3.8 04/09/2011   CL 100 04/09/2011   CO2 32 04/09/2011   GLUCOSE 93 04/09/2011   BUN 17 04/09/2011   CREATININE 0.70 04/09/2011   CALCIUM 9.8 04/09/2011    Studies/Results: Dg Lumbar Spine 2-3 Views  04/16/2011  *RADIOLOGY REPORT*  Clinical Data: L4-5 laminectomy and fusion.  LUMBAR SPINE - 2-3 VIEW  Comparison: 03/04/2011  Findings: Two intraoperative spot images of the lumbar spine demonstrate posterolateral rod pedicle screw fixation at L4-5 with interbody spacer markers in place.  Tissue spreaders are present. No complicating feature observed.  IMPRESSION:  1.  L4-5 PLIF without complicating feature observed.  Original Report Authenticated By: Dellia Cloud, M.D.    Assessment/Plan: We'll try a PCA for pain management,  checked some x-rays, and continue current care   LOS: 1 day    Kimela Malstrom S 04/17/2011, 10:51 AM

## 2011-04-18 MED FILL — Sodium Chloride IV Soln 0.9%: INTRAVENOUS | Qty: 1000 | Status: AC

## 2011-04-18 MED FILL — Heparin Sodium (Porcine) Inj 1000 Unit/ML: INTRAMUSCULAR | Qty: 30 | Status: AC

## 2011-04-18 NOTE — Progress Notes (Signed)
Physical Therapy Treatment Patient Details Name: [name] MRN: [id number] DOB: 1955/12/04 Today's Date: 04/18/2011  PT Assessment/Plan  PT - Assessment/Plan Comments on Treatment Session: Pt progressing well this session, she was highly motivated to complete ambulation and mobility. Continue per plan, will attempt stairs tomorrow prior to d/c PT Plan: Discharge plan remains appropriate;Frequency remains appropriate PT Frequency: Min 5X/week Follow Up Recommendations: Home health PT;Supervision - Intermittent Equipment Recommended: 3 in 1 bedside comode;None recommended by PT PT Goals  Acute Rehab PT Goals PT Goal Formulation: With patient PT Goal: Supine/Side to Sit - Progress: Progressing toward goal PT Goal: Sit to Stand - Progress: Progressing toward goal PT Goal: Stand to Sit - Progress: Progressing toward goal PT Transfer Goal: Bed to Chair/Chair to Bed - Progress: Progressing toward goal PT Goal: Ambulate - Progress: Progressing toward goal  PT Treatment Precautions/Restrictions  Precautions Precautions: Back Required Braces or Orthoses: Yes Spinal Brace: Lumbar corset;Applied in sitting position Restrictions Weight Bearing Restrictions: No Mobility (including Balance) Bed Mobility Bed Mobility: No Transfers Transfers: Yes Sit to Stand: 5: Supervision;With upper extremity assist;From chair/3-in-1 Sit to Stand Details (indicate cue type and reason): VC for proper hand placement and safety with locking RW before standing as well as maintaining back precautions during standing Stand to Sit: 5: Supervision;With upper extremity assist;To chair/3-in-1 Stand to Sit Details: VC for hand placement and back precautions upon sitting.  Ambulation/Gait Ambulation/Gait: Yes Ambulation/Gait Assistance: 4: Min assist (Minguard assist) Ambulation/Gait Assistance Details (indicate cue type and reason): VC for upright posture throughout ambulation as well as safety with rollator. Pt  with slow ambulation, although pt motivated to complete longer distance Ambulation Distance (Feet): 75 Feet Assistive device: Rollator Gait Pattern: Step-to pattern;Decreased stride length;Decreased hip/knee flexion - right;Decreased hip/knee flexion - left Gait velocity: Decreased gait speed Stairs: No    End of Session PT - End of Session Equipment Utilized During Treatment: Gait belt;Back brace Activity Tolerance: Patient limited by fatigue;Patient limited by pain Patient left: in chair;with call bell in reach Nurse Communication: Mobility status for transfers;Mobility status for ambulation General Behavior During Session: [facility] for tasks performed Cognition: [facility] for tasks performed  Milana Kidney 04/18/2011, 9:23 AM  04/18/2011 Milana Kidney DPT PAGER: [phone] OFFICE: [phone]

## 2011-04-18 NOTE — Progress Notes (Signed)
Occupational Therapy Treatment Patient Details Name: [name] MRN: [id number] DOB: May 01, 1955 Today's Date: 04/18/2011  OT Assessment/Plan OT Assessment/Plan Comments on Treatment Session: Pt progressing toward goals.  Pt very motivated to participate in therapy today and asked when she would be receving more therapy.  Pt would benefit from continued education on donning/doffing back brace and use of reacher for ADL activity. OT Plan: Discharge plan remains appropriate OT Frequency: Min 2X/week Follow Up Recommendations: Home health OT;Supervision - Intermittent Equipment Recommended: 3 in 1 bedside comode;None recommended by PT OT Goals ADL Goals Pt Will Perform Grooming: with modified independence;Standing at sink ADL Goal: Grooming - Progress: Progressing toward goals Pt Will Perform Lower Body Dressing: with modified independence;Sit to stand from chair;Sit to stand from bed;with adaptive equipment ADL Goal: Lower Body Dressing - Progress: Progressing toward goals Pt Will Transfer to Toilet: with modified independence;with DME;3-in-1;Ambulation;Maintaining back safety precautions ADL Goal: Toilet Transfer - Progress: Progressing toward goals Miscellaneous OT Goals Miscellaneous OT Goal #2: Pt will don/doff back brace with mod I sitting EOB. OT Goal: Miscellaneous Goal #2 - Progress: Progressing toward goals  OT Treatment Precautions/Restrictions  Precautions Precautions: Back Required Braces or Orthoses: Yes Spinal Brace: Lumbar corset;Applied in sitting position Restrictions Weight Bearing Restrictions: No   ADL ADL Grooming: Performed;Supervision/safety;Teeth care Grooming Details (indicate cue type and reason): VC for maintaining back precautions Where Assessed - Grooming: Standing at sink Lower Body Dressing: Performed;Set up Lower Body Dressing Details (indicate cue type and reason): Pt donned L sock with sock aid with setup assist.  Where Assessed - Lower Body  Dressing: Sitting, chair Toilet Transfer: Simulated;Minimal assistance;Other (comment) (min guard assist) Toilet Transfer Details (indicate cue type and reason): Min guard assist for safety. Toilet Transfer Method: Ambulating Equipment Used: Other (comment) (rollator) Ambulation Related to ADLs: Pt with increased time during ambulation.  Able to maintain upright posture while using rollator. ADL Comments: Pt donned/doffed back brace with mod assist sitting in chair. Pt educated on use of AE for LB ADL activity.   Mobility  Bed Mobility Bed Mobility: No Transfers Sit to Stand: 5: Supervision;With upper extremity assist;From chair/3-in-1 Sit to Stand Details (indicate cue type and reason): VC for proper hand placement and safety with locking RW before standing as well as maintaining back precautions during standing Stand to Sit: 5: Supervision;With upper extremity assist;To chair/3-in-1 Stand to Sit Details: VC for hand placement and back precautions upon sitting.  Exercises    End of Session OT - End of Session Equipment Utilized During Treatment: Back brace Activity Tolerance: Patient tolerated treatment well Patient left: in chair;with call bell in reach General Behavior During Session: [facility] for tasks performed Cognition: [facility] for tasks performed  Cipriano Mile  04/18/2011, 10:29 AM 04/18/2011 Cipriano Mile OTR/L Pager [phone] Office [phone]

## 2011-04-18 NOTE — Progress Notes (Signed)
Entered patient's room at 3:40, pt crying, stating she wants the pulse ox off her finger "right now!" Asked patient why and she said it was giving her high anxiety. Explained to patient that it must be left on, per hospital policy, while she is using a PCA pump. Pt still stated to "get it off her now" and continued to cry. RN turned off the PCA pump and collected the push button. Unhooked the pulse ox, and patient said she felt much better.   Will continue to monitor patient and assist with her anxiety and her education. Will cover pain with existing po meds, etc. at the moment  Min[employer], [employer]

## 2011-04-18 NOTE — Progress Notes (Signed)
Patient ID: [name], female   DOB: 02-21-1956, 56 y.o.   MRN: [id number] Subjective: Patient reports improvement in pain. No leg pain. Back pain "manageable."  Objective: Vital signs in last 24 hours: Temp:  [97.7 F (36.5 C)-98.8 F (37.1 C)] 98.8 F (37.1 C) (01/25 0500) Pulse Rate:  [65-90] 65  (01/25 0500) Resp:  [16-20] 16  (01/25 0759) BP: (132-170)/(79-104) 157/99 mmHg (01/25 0500) SpO2:  [90 %-97 %] 95 % (01/25 0759) Weight:  [79.833 kg (176 lb)] 79.833 kg (176 lb) (01/25 0010)  Intake/Output from previous day: 01/24 0701 - 01/25 0700 In: 1025 [P.O.:125; I.V.:900] Out: 1530 [Urine:1400; Drains:130] Intake/Output this shift: Total I/O In: 480 [P.O.:480] Out: -   Neurologic: Grossly normal  Lab Results: Lab Results  Component Value Date   WBC 4.5 04/09/2011   HGB 12.2 04/09/2011   HCT 37.2 04/09/2011   MCV 84.4 04/09/2011   PLT 199 04/09/2011   Lab Results  Component Value Date   INR 0.93 04/09/2011   BMET Lab Results  Component Value Date   NA 140 04/09/2011   K 3.8 04/09/2011   CL 100 04/09/2011   CO2 32 04/09/2011   GLUCOSE 93 04/09/2011   BUN 17 04/09/2011   CREATININE 0.70 04/09/2011   CALCIUM 9.8 04/09/2011    Studies/Results: Dg Lumbar Spine 2-3 Views  04/17/2011  *RADIOLOGY REPORT*  Clinical Data: 56 year old female status post back surgery. Posterior lumbar fusion.  LUMBAR SPINE - 2-3 VIEW  Comparison: Intraoperative fluoroscopy 04/16/2011.  Preoperative MRI 03/04/2011.  Findings: Normal lumbar segmentation.  Sequelae of L4-L5 posterior fusion, decompression, and interbody fusion.  Hardware appears intact and normally placed.  Posterior postoperative drain in place.  Bone graft material seen about the L4-L5 posterior elements.  Stable and normal vertebral height and alignment elsewhere.  Pelvic calcification suggestive of fibroid uterus.  Right upper quadrant surgical clips.  IMPRESSION: No adverse features status post L4-L5 decompression and fusion.   Original Report Authenticated By: Harley Hallmark, M.D.   Dg Lumbar Spine 2-3 Views  04/16/2011  *RADIOLOGY REPORT*  Clinical Data: L4-5 laminectomy and fusion.  LUMBAR SPINE - 2-3 VIEW  Comparison: 03/04/2011  Findings: Two intraoperative spot images of the lumbar spine demonstrate posterolateral rod pedicle screw fixation at L4-5 with interbody spacer markers in place.  Tissue spreaders are present. No complicating feature observed.  IMPRESSION:  1.  L4-5 PLIF without complicating feature observed.  Original Report Authenticated By: Dellia Cloud, M.D.    Assessment/Plan: Improved. PT/OT.   LOS: 2 days    Dawsen Krieger S 04/18/2011, 8:53 AM

## 2011-04-18 NOTE — Progress Notes (Deleted)
Physical Therapy Evaluation Patient Details Name: [name] MRN: [id number] DOB: Mar 18, 1956 Today's Date: 04/18/2011  Problem List: There is no problem list on file for this patient.   Past Medical History:  Past Medical History  Diagnosis Date  . Complication of anesthesia     BP RISES WHEN AWAKENING  . Heart murmur   . Chronic kidney disease     UTI'S  . Hypertension   . GERD (gastroesophageal reflux disease)   . Headache   . Arthritis   . Anemia     HX  . H/O hiatal hernia   . Neuromuscular disorder     MS  . Lumbar spondylolysis   . Lumbar spinal stenosis    Past Surgical History:  Past Surgical History  Procedure Date  . Uterine fibroid embolization   . Eye surgery     BLEPHAROPLASTY  . Cholecystectomy   . Posterior fusion lumbar spine 04/16/11    L4-5    PT Assessment/Plan/Recommendation PT Plan PT Frequency: Min 5X/week PT Recommendation Follow Up Recommendations: Home health PT;Supervision - Intermittent Equipment Recommended: 3 in 1 bedside comode;None recommended by PT PT Goals  Acute Rehab PT Goals PT Goal Formulation: With patient PT Goal: Supine/Side to Sit - Progress: Progressing toward goal PT Goal: Sit to Stand - Progress: Progressing toward goal PT Goal: Stand to Sit - Progress: Progressing toward goal PT Transfer Goal: Bed to Chair/Chair to Bed - Progress: Progressing toward goal PT Goal: Ambulate - Progress: Progressing toward goal  PT Evaluation Precautions/Restrictions  Precautions Precautions: Back Required Braces or Orthoses: Yes Spinal Brace: Lumbar corset;Applied in sitting position Restrictions Weight Bearing Restrictions: No Prior Functioning      Cognition Cognition Orientation Level: Oriented X4 Sensation/Coordination   Extremity Assessment   Mobility (including Balance) Bed Mobility Bed Mobility: No Transfers Transfers: Yes Sit to Stand: 5: Supervision;With upper extremity assist;From chair/3-in-1 Sit to Stand  Details (indicate cue type and reason): VC for proper hand placement and safety with locking RW before standing as well as maintaining back precautions during standing Stand to Sit: 5: Supervision;With upper extremity assist;To chair/3-in-1 Stand to Sit Details: VC for hand placement and back precautions upon sitting.  Ambulation/Gait Ambulation/Gait: Yes Ambulation/Gait Assistance: 4: Min assist (Minguard assist) Ambulation/Gait Assistance Details (indicate cue type and reason): VC for upright posture throughout ambulation as well as safety with rollator. Pt with slow ambulation, although pt motivated to complete longer distance Ambulation Distance (Feet): 75 Feet Assistive device: Rollator Gait Pattern: Step-to pattern;Decreased stride length;Decreased hip/knee flexion - right;Decreased hip/knee flexion - left Gait velocity: Decreased gait speed Stairs: No    Exercise    End of Session PT - End of Session Equipment Utilized During Treatment: Gait belt;Back brace Activity Tolerance: Patient limited by fatigue;Patient limited by pain Patient left: in chair;with call bell in reach Nurse Communication: Mobility status for transfers;Mobility status for ambulation General Behavior During Session: [facility] for tasks performed Cognition: [facility] for tasks performed  Milana Kidney 04/18/2011, 9:21 AM  04/18/2011 Milana Kidney DPT PAGER: [phone] OFFICE: [phone]

## 2011-04-18 NOTE — Progress Notes (Signed)
Took over pt's care at about 2100 when pt was getting agitated and demanding,earlier requested for a foley catheter due to urinary urgency(pt has hx of MS)which was inserted as ordered by Dr Irine Seal requesting for the foley catheter be removed,later said that she will try and tolerate it for now.At 2200,pt requested to take her betaseron shot which was brought from home and not yet ordered,Dr Phoebe Perch (on call) paged and notified,said to order it and give to pt,pharmacy notified and medication sent to main pharmacy for storage, first dose given at 2330,pt was however reassured,will continue to monitor. Ob[place]-Asidi, Jode Lippe Efe

## 2011-04-19 NOTE — Progress Notes (Signed)
Patient ID: [name], female   DOB: 1955/08/22, 56 y.o.   MRN: [id number] Subjective:  The patient is alert and pleasant.  Objective: Vital signs in last 24 hours: Temp:  [97.3 F (36.3 C)-98.3 F (36.8 C)] 98.3 F (36.8 C) (01/26 0605) Pulse Rate:  [77-80] 80  (01/26 0605) Resp:  [16-20] 20  (01/26 0605) BP: (145-170)/(78-100) 170/90 mmHg (01/26 0605) SpO2:  [94 %-97 %] 94 % (01/26 0605)  Intake/Output from previous day: 01/25 0701 - 01/26 0700 In: 1775 [P.O.:480; I.V.:1295] Out: 1550 [Urine:1550] Intake/Output this shift:    Physical exam patient is alert and oriented. She is moving all 4 extremities.  Lab Results: No results found for this basename: WBC:2,HGB:2,HCT:2,PLT:2 in the last 72 hours BMET No results found for this basename: NA:2,K:2,CL:2,CO2:2,GLUCOSE:2,BUN:2,CREATININE:2,CALCIUM:2 in the last 72 hours  Studies/Results: Dg Lumbar Spine 2-3 Views  04/17/2011  *RADIOLOGY REPORT*  Clinical Data: 56 year old female status post back surgery. Posterior lumbar fusion.  LUMBAR SPINE - 2-3 VIEW  Comparison: Intraoperative fluoroscopy 04/16/2011.  Preoperative MRI 03/04/2011.  Findings: Normal lumbar segmentation.  Sequelae of L4-L5 posterior fusion, decompression, and interbody fusion.  Hardware appears intact and normally placed.  Posterior postoperative drain in place.  Bone graft material seen about the L4-L5 posterior elements.  Stable and normal vertebral height and alignment elsewhere.  Pelvic calcification suggestive of fibroid uterus.  Right upper quadrant surgical clips.  IMPRESSION: No adverse features status post L4-L5 decompression and fusion.  Original Report Authenticated By: Harley Hallmark, M.D.    Assessment/Plan: Postop day #3: I have removed the Hemovac drain. We will discontinue her Foley and mobilize her with PT and OT.  LOS: 3 days     Pamula Luther D 04/19/2011, 8:36 AM

## 2011-04-19 NOTE — Progress Notes (Signed)
Physical Therapy Treatment Patient Details Name: [name] MRN: [id number] DOB: 12-24-1955 Today's Date: 04/19/2011  PT Assessment/Plan  PT - Assessment/Plan Comments on Treatment Session: Patient continues to progress well.  Only needed a little assist with sit to stand and donning brace.  Pt. ambulated to bathroom as well.  Pt. wants to practice bed mobility next session.  Reviewed pointers on back precaution sheet for pt.  HHPT f/u recommended.   PT Plan: Discharge plan remains appropriate;Frequency remains appropriate PT Frequency: Min 5X/week Follow Up Recommendations: Home health PT;Supervision - Intermittent Equipment Recommended: 3 in 1 bedside comode;None recommended by PT PT Goals  Acute Rehab PT Goals PT Goal Formulation: With patient PT Goal: Sit to Stand - Progress: Progressing toward goal PT Goal: Stand to Sit - Progress: Progressing toward goal PT Transfer Goal: Bed to Chair/Chair to Bed - Progress: Progressing toward goal PT Goal: Ambulate - Progress: Progressing toward goal  PT Treatment Precautions/Restrictions  Precautions Precautions: Back Precaution Booklet Issued: Yes (comment) Precaution Comments: educated re: sitting no longer than 30 - 45 minutes at a time  Required Braces or Orthoses: Yes Spinal Brace: Lumbar corset;Applied in sitting position Restrictions Weight Bearing Restrictions: No Mobility (including Balance) Bed Mobility Sit to Supine: Not Tested (comment) Sit to Supine - Details (indicate cue type and reason): Pt. siiting on EOB on arrival Transfers Sit to Stand: 5: Supervision;With upper extremity assist;From bed Stand to Sit: 5: Supervision;With upper extremity assist;With armrests;To chair/3-in-1 Stand to Sit Details: vc for hand placement Ambulation/Gait Ambulation/Gait Assistance: 5: Supervision Ambulation/Gait Assistance Details (indicate cue type and reason): Pt. slow with ambulation but stayed close to RW and incr distance  today. Ambulation Distance (Feet): 350 Feet Assistive device: Rolling walker Gait Pattern: Step-to pattern;Decreased stride length;Decreased hip/knee flexion - right;Decreased hip/knee flexion - left Stairs: No (pt. does not have steps at home and "avoids stairs" per pt.) Wheelchair Mobility Wheelchair Mobility: No  Posture/Postural Control Posture/Postural Control: No significant limitations Balance Balance Assessed: No Exercise    End of Session PT - End of Session Equipment Utilized During Treatment: Gait belt;Back brace Activity Tolerance: Patient tolerated treatment well Patient left: in chair;with call bell in reach Nurse Communication: Mobility status for transfers;Mobility status for ambulation General Behavior During Session: [facility] for tasks performed Cognition: [facility] for tasks performed  INGOLD,Shahana Capes 04/19/2011, 10:32 AM  [facility] [phone] (pager)

## 2011-04-19 NOTE — Progress Notes (Signed)
Occupational Therapy Treatment Patient Details Name: [name] MRN: [id number] DOB: 25-Apr-1955 Today's Date: 04/19/2011  OT Assessment/Plan OT Assessment/Plan Comments on Treatment Session: Pt progressing toward goals.  Pt very motivated to participate in therapy today and asked when she would be receving more therapy.  Pt would benefit from continued education on donning/doffing back brace and Tub Transfer. OT Plan: Discharge plan remains appropriate OT Frequency: Min 2X/week Follow Up Recommendations: Home health OT;Supervision - Intermittent Equipment Recommended: 3 in 1 bedside comode OT Goals Acute Rehab OT Goals OT Goal Formulation: With patient Time For Goal Achievement: 7 days ADL Goals Pt Will Perform Grooming: with modified independence;Standing at sink ADL Goal: Grooming - Progress: Progressing toward goals Pt Will Perform Lower Body Bathing: with modified independence;Sit to stand from chair;Sit to stand from bed;with adaptive equipment ADL Goal: Lower Body Bathing - Progress: Progressing toward goals Pt Will Perform Lower Body Dressing: with modified independence;Sit to stand from chair;Sit to stand from bed;with adaptive equipment ADL Goal: Lower Body Dressing - Progress: Progressing toward goals Pt Will Transfer to Toilet: with modified independence;with DME;3-in-1;Ambulation;Maintaining back safety precautions ADL Goal: Toilet Transfer - Progress: Progressing toward goals Pt Will Perform Tub/Shower Transfer: Tub transfer;with modified independence;Ambulation;with DME;Maintaining back safety precautions;Shower seat with back;Transfer tub bench ADL Goal: Web designer - Progress: Progressing toward goals Miscellaneous OT Goals Miscellaneous OT Goal #1: Pt will perform bed mobility with HOB flat with mod I in prep for EOB ADLs. OT Goal: Miscellaneous Goal #1 - Progress: Not met Miscellaneous OT Goal #2: Pt will don/doff back brace with mod I sitting EOB. OT Goal:  Miscellaneous Goal #2 - Progress: Not met  OT Treatment Precautions/Restrictions  Precautions Precautions: Back Precaution Booklet Issued: Yes (comment) Precaution Comments: educated re: sitting no longer than 30 - 45 minutes at a time  Required Braces or Orthoses: Yes Spinal Brace: Lumbar corset;Applied in sitting position Restrictions Weight Bearing Restrictions: No   ADL ADL Grooming: Performed;Wash/dry hands;Supervision/safety;Set up Where Assessed - Grooming: Standing at sink Lower Body Dressing: Performed;Set up Lower Body Dressing Details (indicate cue type and reason): Pt donned L sock with sock aid with setup assist.  Where Assessed - Lower Body Dressing: Sitting, chair Toilet Transfer: Performed;Minimal assistance Toilet Transfer Details (indicate cue type and reason): Min guard assist for safety. Toilet Transfer Method: Proofreader: Set designer - Clothing Manipulation: Performed;Minimal assistance;Other (comment) Toileting - Clothing Manipulation Details (indicate cue type and reason): Min guard for safety and balance when pulling gown up Where Assessed - Toileting Clothing Manipulation: Standing Toileting - Hygiene: Performed;Minimal assistance;Other (comment) Toileting - Hygiene Details (indicate cue type and reason): Min guard for safety and steadying  Where Assessed - Toileting Hygiene: Standing Tub/Shower Transfer: Simulated;Moderate assistance Tub/Shower Transfer Details (indicate cue type and reason): With use of countertop for support and use of trash can for step over tub Equipment Used: Other (comment) (rollator) ADL Comments: Educated pt. on 3 back precautions with ADLs. Demonstrated use of AE for LB ADLs and simulated tub transfer with trash can step over as wall of tub. Educated on toilet hygiene techniques to prevent twisting.    End of Session OT - End of Session Equipment Utilized During Treatment: Back  brace Activity Tolerance: Patient tolerated treatment well Patient left: in chair;with call bell in reach Nurse Communication: Mobility status for transfers General Behavior During Session: [facility] for tasks performed Cognition: [facility] for tasks performed  Karessa Onorato, OTR/L Pager [phone]  04/19/2011, 4:02 PM

## 2011-04-20 NOTE — Progress Notes (Signed)
Patient ID: [name], female   DOB: 03/11/1956, 56 y.o.   MRN: [id number] Subjective: Patient reports feels she needs one more day  Objective: Vital signs in last 24 hours: Temp:  [98 F (36.7 C)-98.5 F (36.9 C)] 98.5 F (36.9 C) (01/27 0600) Pulse Rate:  [75-89] 81  (01/27 0600) Resp:  [18-20] 18  (01/27 0600) BP: (147-167)/(96-113) 160/96 mmHg (01/27 0600) SpO2:  [93 %-98 %] 93 % (01/27 0600)  Intake/Output from previous day:   Intake/Output this shift:    No new issues  Lab Results: No results found for this basename: WBC:2,HGB:2,HCT:2,PLT:2 in the last 72 hours BMET No results found for this basename: NA:2,K:2,CL:2,CO2:2,GLUCOSE:2,BUN:2,CREATININE:2,CALCIUM:2 in the last 72 hours  Studies/Results: No results found.  Assessment/Plan: Doing quite well. Increasing activity, but a lot of anxiety, and therefore not ready to be at home.   LOS: 4 days  As above   Reinaldo Meeker, MD 04/20/2011, 10:11 AM

## 2011-04-21 MED ORDER — DIAZEPAM 5 MG PO TABS: 5.0000 mg | ORAL_TABLET | Freq: Three times a day (TID) | ORAL | Status: AC | PRN

## 2011-04-21 MED ORDER — OXYCODONE HCL 10 MG PO TABS: 10.0000 mg | ORAL_TABLET | Freq: Four times a day (QID) | ORAL | Status: DC | PRN

## 2011-04-21 NOTE — Discharge Summary (Signed)
Physician Discharge Summary  Patient ID: [name] MRN: [id number] DOB/AGE: 57-17-1957 57 y.o.  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 The patient tolerated the procedure well and was taken to the recovery room and then to the floor in stable condition. The hospital course was routine. There were no complications. The wound remained clean dry and intact. The patient remained afebrile with stable vital signs, and tolerated a regular diet. The patient continued to increase activities, and pain was well controlled with oral pain medications.   Consults: None  Significant Diagnostic Studies:  Results for orders placed during the hospital encounter of 04/09/11  SURGICAL PCR SCREEN      Component Value Range   MRSA, PCR POSITIVE (*) NEGATIVE    Staphylococcus aureus POSITIVE (*) NEGATIVE   CBC      Component Value Range   WBC 4.5  4.0 - 10.5 (K/uL)   RBC 4.41  3.87 - 5.11 (MIL/uL)   Hemoglobin 12.2  12.0 - 15.0 (g/dL)   HCT 40.9  81.1 - 91.4 (%)   MCV 84.4  78.0 - 100.0 (fL)   MCH 27.7  26.0 - 34.0 (pg)   MCHC 32.8  30.0 - 36.0 (g/dL)   RDW 78.2  95.6 - 21.3 (%)   Platelets 199  150 - 400 (K/uL)  DIFFERENTIAL      Component Value Range   Neutrophils Relative 67  43 - 77 (%)   Neutro Abs 3.0  1.7 - 7.7 (K/uL)   Lymphocytes Relative 21  12 - 46 (%)   Lymphs Abs 1.0  0.7 - 4.0 (K/uL)   Monocytes Relative 8  3 - 12 (%)   Monocytes Absolute 0.3  0.1 - 1.0 (K/uL)   Eosinophils Relative 3  0 - 5 (%)   Eosinophils Absolute 0.1  0.0 - 0.7 (K/uL)   Basophils Relative 1  0 - 1 (%)   Basophils Absolute 0.0  0.0 - 0.1 (K/uL)  APTT      Component Value Range   aPTT 24  24 - 37 (seconds)  PROTIME-INR      Component Value Range   Prothrombin Time 12.7  11.6 - 15.2 (seconds)   INR 0.93  0.00 - 1.49   BASIC METABOLIC PANEL      Component Value Range   Sodium 140  135 - 145 (mEq/L)   Potassium 3.8  3.5 - 5.1 (mEq/L)   Chloride 100  96 - 112 (mEq/L)   CO2 32  19 - 32 (mEq/L)   Glucose, Bld 93  70 - 99 (mg/dL)   BUN 17  6 - 23 (mg/dL)   Creatinine, Ser 0.86  0.50 - 1.10 (mg/dL)   Calcium 9.8  8.4 - 57.8 (mg/dL)   GFR calc non Af Amer >90  >90 (mL/min)   GFR calc Af Amer >90  >90 (mL/min)  TYPE AND SCREEN      Component Value Range   ABO/RH(D) O POS     Antibody Screen NEG     Sample Expiration 04/23/2011    ABO/RH      Component Value Range   ABO/RH(D) O POS      Dg Chest 2 View  04/09/2011  *RADIOLOGY REPORT*  Clinical Data: Preoperative respiratory films.  CHEST - 2  VIEW  Comparison: PA and lateral chest 05/22/2008.  Findings: Lungs are clear.  Heart size is normal.  No pneumothorax or pleural effusion.  Remote right sixth and seventh rib fractures are noted.  IMPRESSION: No acute disease.  Original Report Authenticated By: Bernadene Bell. D'ALESSIO, M.D.   Dg Lumbar Spine 2-3 Views  04/17/2011  *RADIOLOGY REPORT*  Clinical Data: 56 year old female status post back surgery. Posterior lumbar fusion.  LUMBAR SPINE - 2-3 VIEW  Comparison: Intraoperative fluoroscopy 04/16/2011.  Preoperative MRI 03/04/2011.  Findings: Normal lumbar segmentation.  Sequelae of L4-L5 posterior fusion, decompression, and interbody fusion.  Hardware appears intact and normally placed.  Posterior postoperative drain in place.  Bone graft material seen about the L4-L5 posterior elements.  Stable and normal vertebral height and alignment elsewhere.  Pelvic calcification suggestive of fibroid uterus.  Right upper quadrant surgical clips.  IMPRESSION: No adverse features status post L4-L5 decompression and fusion.  Original Report Authenticated By: Harley Hallmark, M.D.   Dg Lumbar Spine 2-3 Views  04/16/2011  *RADIOLOGY REPORT*  Clinical Data: L4-5  laminectomy and fusion.  LUMBAR SPINE - 2-3 VIEW  Comparison: 03/04/2011  Findings: Two intraoperative spot images of the lumbar spine demonstrate posterolateral rod pedicle screw fixation at L4-5 with interbody spacer markers in place.  Tissue spreaders are present. No complicating feature observed.  IMPRESSION:  1.  L4-5 PLIF without complicating feature observed.  Original Report Authenticated By: Dellia Cloud, M.D.    Antibiotics:  Anti-infectives     Start     Dose/Rate Route Frequency Ordered Stop   04/16/11 2000   ceFAZolin (ANCEF) IVPB 1 g/50 mL premix        1 g 100 mL/hr over 30 Minutes Intravenous 3 times per day 04/16/11 1707 04/17/11 0601   04/16/11 1715   ceFAZolin (ANCEF) IVPB 1 g/50 mL premix  Status:  Discontinued        1 g 100 mL/hr over 30 Minutes Intravenous  Once 04/16/11 1708 04/16/11 1742   04/16/11 1231   bacitracin 50,000 Units in sodium chloride irrigation 0.9 % 500 mL irrigation  Status:  Discontinued          As needed 04/16/11 1231 04/16/11 1443   04/16/11 1131   bacitracin 16109 UNITS injection     Comments: FREEZE, PATRICIA: cabinet override         04/16/11 1131 04/16/11 2344   04/16/11 0938   ceFAZolin (ANCEF) 1-5 GM-% IVPB     Comments: Lahoma Rocker: cabinet override         04/16/11 0938 04/16/11 1158          Discharge Exam: Blood pressure 136/82, pulse 72, temperature 98.4 F (36.9 C), temperature source Oral, resp. rate 18, height 5\' 2"  (1.575 m), weight 79.833 kg (176 lb), SpO2 98.00%. Neurologic: Grossly normal  Discharge Medications:   Medication List  As of 04/21/2011  9:48 AM   STOP taking these medications         traMADol 50 MG tablet         TAKE these medications         amLODipine 10 MG tablet   Commonly known as: NORVASC   Take 10 mg by mouth daily.      atorvastatin 20 MG tablet   Commonly known as: LIPITOR   Take 20 mg by mouth daily.      baclofen 20 MG tablet   Commonly known as: LIORESAL   Take 20 mg  by mouth  4 (four) times daily.      CALCIUM-VITAMIN D PO   Take 1 tablet by mouth daily.      dexlansoprazole 60 MG capsule   Commonly known as: DEXILANT   Take 60 mg by mouth daily.      diazepam 5 MG tablet   Commonly known as: VALIUM   Take 1 tablet (5 mg total) by mouth every 8 (eight) hours as needed for anxiety.      Fish Oil 1200 MG Caps   Take 1 capsule by mouth 2 (two) times daily.      fluticasone 50 MCG/ACT nasal spray   Commonly known as: FLONASE   Place 2 sprays into the nose 2 (two) times daily as needed. For stuffy nose.      gabapentin 100 MG capsule   Commonly known as: NEURONTIN   Take 200 mg by mouth 3 (three) times daily.      gabapentin 300 MG capsule   Commonly known as: NEURONTIN   Take 300 mg by mouth. TAKE 1 TAB FOR SEVERE HEADACHES      interferon beta-1b 0.3 MG injection   Commonly known as: BETASERON   Inject 0.25 mg into the skin every other day.      lisinopril-hydrochlorothiazide 20-25 MG per tablet   Commonly known as: PRINZIDE,ZESTORETIC   Take 1 tablet by mouth daily.      meclizine 25 MG tablet   Commonly known as: ANTIVERT   Take 25 mg by mouth 3 (three) times daily as needed. FOR DIZZINESS      montelukast 10 MG tablet   Commonly known as: SINGULAIR   Take 10 mg by mouth at bedtime.      multivitamins ther. w/minerals Tabs   Take 1 tablet by mouth daily.      Oxycodone HCl 10 MG Tabs   Take 1 tablet (10 mg total) by mouth every 6 (six) hours as needed. For pain.      potassium chloride SA 20 MEQ tablet   Commonly known as: K-DUR,KLOR-CON   Take 20 mEq by mouth daily.      sertraline 100 MG tablet   Commonly known as: ZOLOFT   Take 150-200 mg by mouth daily. Depending on mood pt takes 150-200 mg daily.      traZODone 100 MG tablet   Commonly known as: DESYREL   Take 100 mg by mouth at bedtime.            Disposition: home   Final Dx: PLIF L4-5  Discharge Orders    Future Orders Please Complete By Expires    Diet - low sodium heart healthy      Increase activity slowly      Driving Restrictions      Comments:   2 weeks   Lifting restrictions      Comments:   Less than 10 lbs   No wound care      Call MD for:  temperature >100.4      Call MD for:  persistant nausea and vomiting      Call MD for:  severe uncontrolled pain      Call MD for:  redness, tenderness, or signs of infection (pain, swelling, redness, odor or green/yellow discharge around incision site)      Call MD for:  difficulty breathing, headache or visual disturbances         Follow-up Information    Follow up with Krizia Flight S, MD in 2 weeks.  Contact information:   [address] [place] [address] [phone]           Signed: Tia Alert 04/21/2011, 9:48 AM

## 2011-04-21 NOTE — Progress Notes (Signed)
Occupational Therapy Treatment Patient Details Name: [name] MRN: [id number] DOB: 02/02/1956 Today's Date: 04/21/2011  OT Assessment/Plan OT Assessment/Plan Comments on Treatment Session: pt. set for d/c home today. several questions regarding HH services, answered all questions OT Plan: Discharge plan remains appropriate OT Frequency: Min 2X/week Equipment Recommended: 3 in 1 bedside comode OT Goals Acute Rehab OT Goals Time For Goal Achievement: 7 days ADL Goals ADL Goal: Grooming - Progress: Progressing toward goals ADL Goal: Lower Body Bathing - Progress: Progressing toward goals ADL Goal: Lower Body Dressing - Progress: Progressing toward goals ADL Goal: Toilet Transfer - Progress: Progressing toward goals ADL Goal: Tub/Shower Transfer - Progress: Progressing toward goals Miscellaneous OT Goals OT Goal: Miscellaneous Goal #1 - Progress: Progressing toward goals OT Goal: Miscellaneous Goal #2 - Progress: Progressing toward goals  OT Treatment Precautions/Restrictions  Precautions Precautions: Back Required Braces or Orthoses: Yes Spinal Brace: Lumbar corset Restrictions Weight Bearing Restrictions: No   ADL ADL Upper Body Dressing: Performed;Modified independent;Other (comment) (back brace) Where Assessed - Upper Body Dressing: Sitting, chair Lower Body Dressing: Performed;Modified independent Lower Body Dressing Details (indicate cue type and reason): able to don/doff b socks, by crossing left/right leg over Where Assessed - Lower Body Dressing: Sitting, chair ADL Comments: able to state 3/3 back precautions Mobility    Exercises    End of Session OT - End of Session Equipment Utilized During Treatment: Back brace Activity Tolerance: Patient tolerated treatment well Patient left: in chair;with call bell in reach General Behavior During Session: [facility] for tasks performed Cognition: [facility] for tasks performed  Robet Leu COTA/L 04/21/2011, 1:06  PM

## 2011-04-21 NOTE — Progress Notes (Signed)
Pt discharged to home at 11 am without incident. Condtion at baseline.

## 2011-05-20 ENCOUNTER — Other Ambulatory Visit: Payer: Self-pay | Admitting: Neurological Surgery

## 2011-05-20 ENCOUNTER — Ambulatory Visit
Admission: RE | Admit: 2011-05-20 | Discharge: 2011-05-20 | Disposition: A | Discharge status: Home / Self Care | Payer: Medicare Other | Source: Ambulatory Visit | Attending: Neurological Surgery | Admitting: Neurological Surgery

## 2011-05-20 DIAGNOSIS — M47817 Spondylosis without myelopathy or radiculopathy, lumbosacral region: Secondary | ICD-10-CM

## 2011-08-06 ENCOUNTER — Other Ambulatory Visit: Payer: Self-pay | Admitting: Obstetrics and Gynecology

## 2011-08-06 ENCOUNTER — Other Ambulatory Visit (HOSPITAL_COMMUNITY)
Admission: RE | Admit: 2011-08-06 | Discharge: 2011-08-06 | Disposition: A | Discharge status: Home / Self Care | Payer: Medicare Other | Source: Ambulatory Visit | Attending: Obstetrics and Gynecology | Admitting: Obstetrics and Gynecology

## 2011-08-06 DIAGNOSIS — Z01419 Encounter for gynecological examination (general) (routine) without abnormal findings: Secondary | ICD-10-CM | POA: Insufficient documentation

## 2011-10-29 ENCOUNTER — Other Ambulatory Visit (HOSPITAL_COMMUNITY): Payer: Self-pay | Admitting: Obstetrics and Gynecology

## 2011-10-29 DIAGNOSIS — Z1231 Encounter for screening mammogram for malignant neoplasm of breast: Secondary | ICD-10-CM

## 2011-12-01 ENCOUNTER — Ambulatory Visit (HOSPITAL_COMMUNITY): Payer: Medicare Other | Attending: Obstetrics and Gynecology

## 2011-12-29 ENCOUNTER — Ambulatory Visit (HOSPITAL_COMMUNITY)
Admission: RE | Admit: 2011-12-29 | Discharge: 2011-12-29 | Disposition: A | Discharge status: Home / Self Care | Payer: Medicare Other | Source: Ambulatory Visit | Attending: Obstetrics and Gynecology | Admitting: Obstetrics and Gynecology

## 2011-12-29 DIAGNOSIS — Z1231 Encounter for screening mammogram for malignant neoplasm of breast: Secondary | ICD-10-CM

## 2012-07-27 ENCOUNTER — Telehealth: Payer: Self-pay | Admitting: *Deleted

## 2012-07-27 NOTE — Telephone Encounter (Signed)
R/S to 11/09/12 per patient.

## 2012-09-22 ENCOUNTER — Other Ambulatory Visit (HOSPITAL_COMMUNITY)
Admission: RE | Admit: 2012-09-22 | Discharge: 2012-09-22 | Disposition: A | Discharge status: Home / Self Care | Payer: Medicare Other | Source: Ambulatory Visit | Attending: Obstetrics and Gynecology | Admitting: Obstetrics and Gynecology

## 2012-09-22 ENCOUNTER — Other Ambulatory Visit: Payer: Self-pay | Admitting: Obstetrics and Gynecology

## 2012-09-22 DIAGNOSIS — Z1151 Encounter for screening for human papillomavirus (HPV): Secondary | ICD-10-CM | POA: Insufficient documentation

## 2012-09-22 DIAGNOSIS — Z01419 Encounter for gynecological examination (general) (routine) without abnormal findings: Secondary | ICD-10-CM | POA: Insufficient documentation

## 2012-10-22 ENCOUNTER — Ambulatory Visit: Payer: Self-pay | Admitting: Neurology

## 2012-11-08 ENCOUNTER — Ambulatory Visit: Payer: Self-pay | Admitting: Neurology

## 2012-11-09 ENCOUNTER — Ambulatory Visit: Payer: Self-pay | Admitting: Neurology

## 2012-11-10 ENCOUNTER — Ambulatory Visit: Payer: Self-pay | Admitting: Neurology

## 2012-11-25 ENCOUNTER — Other Ambulatory Visit: Payer: Self-pay

## 2012-11-25 ENCOUNTER — Other Ambulatory Visit (HOSPITAL_COMMUNITY): Payer: Self-pay | Admitting: Obstetrics and Gynecology

## 2012-11-25 DIAGNOSIS — Z1231 Encounter for screening mammogram for malignant neoplasm of breast: Secondary | ICD-10-CM

## 2012-11-25 MED ORDER — INTERFERON BETA-1B 0.3 MG ~~LOC~~ KIT: 0.2500 mg | PACK | SUBCUTANEOUS | Status: DC

## 2012-11-25 NOTE — Telephone Encounter (Signed)
Former [facility] patient assigned to Dr Yan  

## 2012-12-29 ENCOUNTER — Encounter: Payer: Self-pay | Admitting: Neurology

## 2012-12-29 ENCOUNTER — Encounter (INDEPENDENT_AMBULATORY_CARE_PROVIDER_SITE_OTHER): Payer: Self-pay

## 2012-12-29 ENCOUNTER — Ambulatory Visit (INDEPENDENT_AMBULATORY_CARE_PROVIDER_SITE_OTHER): Payer: Medicare Other | Admitting: Neurology

## 2012-12-29 VITALS — BP 156/96 | HR 60 | Ht 62.0 in | Wt 174.0 lb

## 2012-12-29 DIAGNOSIS — G35 Multiple sclerosis: Secondary | ICD-10-CM

## 2012-12-29 MED ORDER — TOPIRAMATE 25 MG PO CPSP: 50.0000 mg | ORAL_CAPSULE | Freq: Two times a day (BID) | ORAL | Status: DC

## 2012-12-29 MED ORDER — BUTALBITAL-APAP-CAFFEINE 50-325-40 MG PO TABS: 1.0000 | ORAL_TABLET | Freq: Four times a day (QID) | ORAL | Status: DC | PRN

## 2012-12-29 NOTE — Patient Instructions (Signed)
Stop gabapentin Start Topamax titrating from 25 mg twice a day, then 2 tablets twice a day Fioricet as needed for headaches, limit the use to twice a week

## 2012-12-30 ENCOUNTER — Ambulatory Visit (HOSPITAL_COMMUNITY): Payer: Medicare Other | Attending: Obstetrics and Gynecology

## 2012-12-30 DIAGNOSIS — G35 Multiple sclerosis: Secondary | ICD-10-CM | POA: Insufficient documentation

## 2012-12-30 NOTE — Progress Notes (Signed)
[facility]  PATIENT: [name] DOB: 1955-04-14  HISTORICAL  Karishma is a 57 years old right-handed Caucasian female, previously patient of Dr. Sandria Manly, follow up for relapsing remitting multiple sclerosis  The diagnosis was made in 1982, she presented with acute onset left-sided numbness, weakness, gait difficulty, she also suffered one episode of left optic neuritis in 1980s, with total vision recovery within few weeks  She has been treated with Betaseron since 1980s, tolerating it very well,  There is no other significant flareup over the years,   she has baseline gait difficulty, she denies significant change in her gait difficulty, she fell in January 2010, developed interhemispheric subdural hematoma without mass effect.  Most recent MRI of the brain was at [facility] 2008, which demonstrated multiple bilateral periventricular, subcortical white matter hyperdensity, many of which has become confluent, infratentorial lesions also noted including left midline medullary lesion, possible right lateral medullary lesion, deep right cerebellum hemisphere, possibly pontine lesions, there is no contrast-enhancement, no change compared to previous study in 2007, most recent MRI cervical spine dated November 2004, from [facility] imaging showed abnormality at C3, likely representing demyelinating plaques.  She denies significant flareups, does not want to consider the change of her disease modifying medication, she has got used to [employer]  She had a bout of frequent headaches in 2009, was diagnosed with possible migraines, over past one year, she began to have frequent headaches again, usually starting with a severe sharp ice piercing pain, left with a trail of moderate pressure pain, lasting for couple hours, she has been taking gabapentin 100 mg 2 tablets 3 times a day, additional 300 mg of gabapentin during the pain, without significant improvement, she has never tried  Topamax in the past  REVIEW OF SYSTEMS: Full 14 system review of systems performed and notable only for fatigue, murmur, trouble swelling, blurred vision, incontinence, constipation, easy bruising, increased thirst, flushing, joint pain, achy muscles, memory loss, confusion, headaches, weakness, slurred speech, difficulty swelling, dizziness, insomnia, sleepiness, restless legs,  ALLERGIES: Allergies  Allergen Reactions  . Other Other (See Comments)    Pt states she took a medication 2-3 yrs ago that may have been for inflammation and it caused her to be weak.    HOME MEDICATIONS: Outpatient Prescriptions Prior to Visit  Medication Sig Dispense Refill  . amLODipine (NORVASC) 10 MG tablet Take 10 mg by mouth daily.        Marland Kitchen atorvastatin (LIPITOR) 20 MG tablet Take 20 mg by mouth daily.        . baclofen (LIORESAL) 20 MG tablet Take 20 mg by mouth 4 (four) times daily.        Marland Kitchen CALCIUM-VITAMIN D PO Take 1 tablet by mouth daily.        Marland Kitchen dexlansoprazole (DEXILANT) 60 MG capsule Take 60 mg by mouth daily.        . fluticasone (FLONASE) 50 MCG/ACT nasal spray Place 2 sprays into the nose 2 (two) times daily as needed. For stuffy nose.       . Interferon Beta-1b (BETASERON) 0.3 MG KIT injection Inject 0.25 mg into the skin every other day.  1 kit  3  . lisinopril-hydrochlorothiazide (PRINZIDE,ZESTORETIC) 20-25 MG per tablet Take 1 tablet by mouth daily.        . meclizine (ANTIVERT) 25 MG tablet Take 25 mg by mouth 3 (three) times daily as needed. FOR DIZZINESS      . montelukast (SINGULAIR) 10 MG tablet Take  10 mg by mouth at bedtime.        . Multiple Vitamins-Minerals (MULTIVITAMINS THER. W/MINERALS) TABS Take 1 tablet by mouth daily.        . Omega-3 Fatty Acids (FISH OIL) 1200 MG CAPS Take 1 capsule by mouth 2 (two) times daily.        . Oxycodone HCl 10 MG TABS Take 1 tablet (10 mg total) by mouth every 6 (six) hours as needed. For pain.  80 each  0  . potassium chloride SA  (K-DUR,KLOR-CON) 20 MEQ tablet Take 20 mEq by mouth daily.        . sertraline (ZOLOFT) 100 MG tablet Take 150-200 mg by mouth daily. Depending on mood pt takes 150-200 mg daily.       . traZODone (DESYREL) 100 MG tablet Take 100 mg by mouth at bedtime.        . gabapentin (NEURONTIN) 100 MG capsule Take 200 mg by mouth 3 (three) times daily.        Marland Kitchen gabapentin (NEURONTIN) 300 MG capsule Take 300 mg by mouth. TAKE 1 TAB FOR SEVERE HEADACHES       No facility-administered medications prior to visit.    PAST MEDICAL HISTORY: Past Medical History  Diagnosis Date  . Complication of anesthesia     BP RISES WHEN AWAKENING  . Heart murmur   . Chronic kidney disease     UTI'S  . Hypertension   . GERD (gastroesophageal reflux disease)   . Headache(784.0)   . Arthritis   . Anemia     HX  . H/O hiatal hernia   . Neuromuscular disorder     MS  . Lumbar spondylolysis   . Lumbar spinal stenosis   . MS (multiple sclerosis)     PAST SURGICAL HISTORY: Past Surgical History  Procedure Laterality Date  . Uterine fibroid embolization    . Eye surgery      BLEPHAROPLASTY  . Cholecystectomy    . Posterior fusion lumbar spine  04/16/11    L4-5    FAMILY HISTORY: Family History  Problem Relation Age of Onset  . Lung cancer Mother   . Emphysema Father   . Heart disease      SOCIAL HISTORY:  History   Social History  . Marital Status: Single    Spouse Name: N/A    Number of Children: 0  . Years of Education: 14   Occupational History  .      Does not work - Disabled   Social History Main Topics  . Smoking status: Former Smoker -- 2.00 packs/day    Quit date: 04/08/1996  . Smokeless tobacco: Never Used     Comment: ALCOHOL IN PAST  . Alcohol Use: No  . Drug Use: No  . Sexual Activity: Yes    Birth Control/ Protection: Post-menopausal   Other Topics Concern  . Not on file   Social History Narrative   Patient does not work she is disabled. and lives at home. Patient  is single.   Caffeine -1-2 cups daily.    Right handed.     PHYSICAL EXAM   Filed Vitals:   12/29/12 1428  BP: 156/96  Pulse: 60  Height: 5\' 2"  (1.575 m)  Weight: 174 lb (78.926 kg)   Body mass index is 31.82 kg/(m^2).   Generalized: In no acute distress  Neck: Supple, no carotid bruits   Cardiac: Regular rate rhythm  Pulmonary: Clear to auscultation bilaterally  Musculoskeletal: No  deformity  Neurological examination  Mentation: Alert oriented to time, place, history taking, and causual conversation  Cranial nerve II-XII: Pupils were equal round reactive to light extraocular movements were full, visual field were full on confrontational test. facial sensation and strength were normal. hearing was intact to finger rubbing bilaterally. Uvula tongue midline.  head turning and shoulder shrug and were normal and symmetric.Tongue protrusion into cheek strength was normal.  Motor: normal tone, bulk and strength.  Sensory: Intact to fine touch, pinprick, preserved vibratory sensation, and proprioception at toes.  Coordination: Normal finger to nose, heel-to-shin bilaterally there was no truncal ataxia  Gait: Rising from seated position by pushing on chair arm, right base, cautious mildly unsteady gait, she could not perform tiptoe, heel, or tandem walking,  Romberg signs: Negative  Deep tendon reflexes: Brachioradialis 2/2, biceps 2/2, triceps 2/2, patellar 3/3, Achilles 2/2, plantar responses were flexor bilaterally.   DIAGNOSTIC DATA (LABS, IMAGING, TESTING) - I reviewed patient records, labs, notes, testing and imaging myself where available.  Lab Results  Component Value Date   WBC 4.5 04/09/2011   HGB 12.2 04/09/2011   HCT 37.2 04/09/2011   MCV 84.4 04/09/2011   PLT 199 04/09/2011      Component Value Date/Time   NA 140 04/09/2011 1035   K 3.8 04/09/2011 1035   CL 100 04/09/2011 1035   CO2 32 04/09/2011 1035   GLUCOSE 93 04/09/2011 1035   BUN 17 04/09/2011 1035    CREATININE 0.70 04/09/2011 1035   CALCIUM 9.8 04/09/2011 1035   PROT 6.7 04/06/2008 2015   ALBUMIN 3.9 04/06/2008 2015   AST 23 04/06/2008 2015   ALT 27 04/06/2008 2015   ALKPHOS 69 04/06/2008 2015   BILITOT 0.8 04/06/2008 2015   GFRNONAA >90 04/09/2011 1035   GFRAA >90 04/09/2011 1035   ASSESSMENT AND PLAN   57 years old Caucasian female, with history of relapsing remitting multiple sclerosis, which was diagnosed since 1982, she had baseline gait difficulty, she has been fairly stable clinical-wise, previous MRI of the brain, and the cervical spine showed lesions consistent with multiple sclerosis, she is to continue taking Betaseron treatment,  1.  continue moderate exercise, 2.  she complains of frequent headaches, previous history of migraine, failed response Neurontin, we will switch to Topamax 3 return to clinic in 6 months with Eber Jones.         Levert Feinstein, M.D. Ph.D.  [facility] [address], [address] [place], [place] [address] [phone]

## 2012-12-31 ENCOUNTER — Telehealth: Payer: Self-pay | Admitting: Neurology

## 2012-12-31 NOTE — Telephone Encounter (Signed)
Topamax is making her feel really drunk. Patient states she took RX last night and this morning.  Patient states she wants something else.

## 2012-12-31 NOTE — Telephone Encounter (Signed)
I have called her, she has tried few doses of topamax 25mg  qhs, she complains of light headedness, dizziness, she wants to see how her migraine do without any preventive medications.

## 2013-01-18 ENCOUNTER — Telehealth: Payer: Self-pay | Admitting: Neurology

## 2013-01-21 NOTE — Telephone Encounter (Signed)
I have left message for [name], if she still needs help, she should call back.

## 2013-01-26 ENCOUNTER — Ambulatory Visit (HOSPITAL_COMMUNITY): Payer: Medicare Other

## 2013-01-28 ENCOUNTER — Ambulatory Visit (HOSPITAL_COMMUNITY)
Admission: RE | Admit: 2013-01-28 | Discharge: 2013-01-28 | Disposition: A | Discharge status: Home / Self Care | Payer: Medicare Other | Source: Ambulatory Visit | Attending: Obstetrics and Gynecology | Admitting: Obstetrics and Gynecology

## 2013-01-28 DIAGNOSIS — Z1231 Encounter for screening mammogram for malignant neoplasm of breast: Secondary | ICD-10-CM | POA: Insufficient documentation

## 2013-02-03 ENCOUNTER — Telehealth: Payer: Self-pay | Admitting: Neurology

## 2013-02-11 NOTE — Telephone Encounter (Signed)
called patient to reschedule appt per yan, couldn't reach patient, left message to call back and reschedule. Patient have also decided to change doctors, she will not be seeing Dr Terrace Arabia, another doctor have not been asigned at this time.

## 2013-02-15 ENCOUNTER — Ambulatory Visit: Payer: Self-pay | Admitting: Neurology

## 2013-03-10 ENCOUNTER — Telehealth: Payer: Self-pay | Admitting: *Deleted

## 2013-03-10 ENCOUNTER — Other Ambulatory Visit: Payer: Self-pay | Admitting: Neurology

## 2013-03-10 NOTE — Telephone Encounter (Signed)
I returned call and left VM that Alverda Skeans, RN takes care of reassigning Dr. Imagene Gurney patients. She is out of the office this week and next. I will let her know you would like to be reassigned and scheduled.   Also, I will refer your request for renewal of your Betaseron on to our pharmacy assistant to address as well.

## 2013-04-01 NOTE — Telephone Encounter (Signed)
Per Dr. Krista Blue ok to see other MD.  Will check with other MD's.

## 2013-04-04 NOTE — Telephone Encounter (Signed)
Reassigned to Dr. Leta Baptist, and given to scheduling to call pt.

## 2013-04-25 ENCOUNTER — Telehealth: Payer: Self-pay | Admitting: *Deleted

## 2013-04-25 NOTE — Telephone Encounter (Signed)
All requested info has been sent to ins.  Pending response.   

## 2013-06-29 ENCOUNTER — Ambulatory Visit: Payer: Medicare Other | Admitting: Nurse Practitioner

## 2013-07-07 ENCOUNTER — Ambulatory Visit (INDEPENDENT_AMBULATORY_CARE_PROVIDER_SITE_OTHER): Payer: Medicare Other | Admitting: Diagnostic Neuroimaging

## 2013-07-07 ENCOUNTER — Encounter (INDEPENDENT_AMBULATORY_CARE_PROVIDER_SITE_OTHER): Payer: Self-pay

## 2013-07-07 ENCOUNTER — Encounter: Payer: Self-pay | Admitting: Diagnostic Neuroimaging

## 2013-07-07 VITALS — BP 123/77 | HR 60 | Ht 61.0 in | Wt 173.5 lb

## 2013-07-07 DIAGNOSIS — G35 Multiple sclerosis: Secondary | ICD-10-CM

## 2013-07-07 DIAGNOSIS — G43009 Migraine without aura, not intractable, without status migrainosus: Secondary | ICD-10-CM | POA: Insufficient documentation

## 2013-07-07 MED ORDER — GABAPENTIN 100 MG PO CAPS: 100.0000 mg | ORAL_CAPSULE | Freq: Three times a day (TID) | ORAL | Status: DC

## 2013-07-07 NOTE — Patient Instructions (Signed)
Increase gabapentin up to 3 tabs per day for head prevention.

## 2013-07-07 NOTE — Progress Notes (Signed)
[facility]  PATIENT: [name] DOB: 59/24/1957  REFERRING CLINICIAN:  HISTORY FROM: patient  REASON FOR VISIT: follow up (transfer, Dr. Krista Blue, Dr. Erling Cruz)   HISTORICAL  CHIEF COMPLAINT:  Chief Complaint  Patient presents with  . Follow-up    MS    HISTORY OF PRESENT ILLNESS:   UPDATE 07/07/13: No MS flares. Tolerating betaseron. Tried TPX for migraine/headache prevention, but had to stop due to side effects. Re: HA, she has intermittent bitempora, severe HA with photophobia/phonophobia, neck pain and dizziness. No nausea/vomiting. HA last 30-60 minutes and occur 4-5 times per week. Also with occipital scalp sensitivity when she puts pressure on that region.   UPDATE (12/30/12, Dr. Krista Blue): She denies significant flareups, does not want to consider the change of her disease modifying medication, she has got used to [employer]. She had a bout of frequent headaches in 2009, was diagnosed with possible migraines, over past one year, she began to have frequent headaches again, usually starting with a severe sharp ice piercing pain, left with a trail of moderate pressure pain, lasting for couple hours, she has been taking gabapentin 100 mg 2 tablets 3 times a day, additional 300 mg of gabapentin during the pain, without significant improvement, she has never tried Topamax in the past  PRIOR HPI (10/29/11, Dr. Erling Cruz): 58 year old right-handed white single with symptoms beginning in 1982 and first seen by me in October 1983 for chronic gait disorder with recurrent falls secondary to multiple sclerosis. She was seen by Dr. Alfredia Client in [place] and placed on placebo in a double-blind Betaseron trial. She is currently on Betaseron and has recurrent falls. She fell 04/06/2008 and developed an interhemispheric subdural hematoma without mass effect. The study showed evidence of extensive periventricular white matter disease. She had  frontal and occipital scalp hematomas on CT scan. She  was  ad[facility] to [facility] by Dr. Lavone Orn. She continues to have walking problems and use a single-point cane to walk Since She has falls 2 or 3 times per 6 months. Her last fall was 4 months ago injuring her left elbow.She has bladder incontinence but no bowel incontinence. She has constipation. She did not try ampyra. She is independent in activities of daily living including cooking,cleaning, and doing laundry, making her bed, washing dishes, limited yardwork, but has her sister go with her shopping. She notices memory loss.. She is exercising by walking 15 minutes daily with  her cane. She denies Lhermitte's sign.She has headaches which have improved. Her headaches are located in a bandlike distribution in the bitemporal region and frontal area. She notices nothing which makes him worse but states they're improved with Tylenol.She does not know the length of time it usually lasts. She is on Gabapentin which is being used as an abortive for headaches. She lives alone..She has arthritic pains and takes tramadol 50 mg twice daily and 100 mg at night. She has back pain which radiates down her right or left leg. 03/2011 Dr. Ronnald Ramp performed surgery at L4-5. 10/08/2011 CBC was normal.  REVIEW OF SYSTEMS: Full 14 system review of systems performed and notable only for fatigue excessive sweating ear discharge hearing loss ear pain trouble swallowing leg swelling murmur eye itching eyes light sensitivity heat intolerance excessive thirst incontinent bowels constipation restless legs frequent waking snoring back pain joint pain aching muscles muscle cramps of the walking neck pain next is confusion decreased concentration incontinence of bladder.   ALLERGIES: Allergies  Allergen Reactions  . Other  Other (See Comments)    Pt states she took a medication 2-3 yrs ago that may have been for inflammation and it caused her to be weak.    HOME MEDICATIONS: Outpatient Prescriptions Prior to Visit  Medication  Sig Dispense Refill  . amLODipine (NORVASC) 10 MG tablet Take 10 mg by mouth daily.        Marland Kitchen atorvastatin (LIPITOR) 20 MG tablet Take 20 mg by mouth daily.        . baclofen (LIORESAL) 20 MG tablet Take 20 mg by mouth 4 (four) times daily.        Marland Kitchen BETASERON 0.3 MG KIT injection RECONSTITUTE WITH 1.2ML DILUENT AND INJECT 1ML -- 0.25MG -- SUBCUTANEOUSLY EVERY OTHER DAY  14 each  6  . butalbital-acetaminophen-caffeine (FIORICET, ESGIC) 50-325-40 MG per tablet Take 1 tablet by mouth every 6 (six) hours as needed for headache.  30 tablet  3  . CALCIUM-VITAMIN D PO Take 1 tablet by mouth daily.        . clobetasol cream (TEMOVATE) 0.05 %       . dexlansoprazole (DEXILANT) 60 MG capsule Take 60 mg by mouth daily.        . fluticasone (FLONASE) 50 MCG/ACT nasal spray Place 2 sprays into the nose 2 (two) times daily as needed. For stuffy nose.       Marland Kitchen lisinopril-hydrochlorothiazide (PRINZIDE,ZESTORETIC) 20-25 MG per tablet Take 1 tablet by mouth daily.        . meclizine (ANTIVERT) 25 MG tablet Take 25 mg by mouth 3 (three) times daily as needed. FOR DIZZINESS      . montelukast (SINGULAIR) 10 MG tablet Take 10 mg by mouth at bedtime.        . Multiple Vitamins-Minerals (MULTIVITAMINS THER. W/MINERALS) TABS Take 1 tablet by mouth daily.        Marland Kitchen NEXIUM 40 MG capsule Take 40 mg by mouth daily.      Marland Kitchen nystatin-triamcinolone ointment (MYCOLOG)       . Omega-3 Fatty Acids (FISH OIL) 1200 MG CAPS Take 1 capsule by mouth 2 (two) times daily.        . Oxycodone HCl 10 MG TABS Take 1 tablet (10 mg total) by mouth every 6 (six) hours as needed. For pain.  80 each  0  . potassium chloride SA (K-DUR,KLOR-CON) 20 MEQ tablet Take 20 mEq by mouth daily.        . sertraline (ZOLOFT) 100 MG tablet Take 150-200 mg by mouth daily. Depending on mood pt takes 150-200 mg daily.       . traMADol (ULTRAM) 50 MG tablet Take 50 mg by mouth daily.      . traZODone (DESYREL) 100 MG tablet Take 100 mg by mouth at bedtime.          . gabapentin (NEURONTIN) 100 MG capsule Take 100 mg by mouth daily.      Marland Kitchen HYDROcodone-acetaminophen (NORCO/VICODIN) 5-325 MG per tablet Take 325 tablets by mouth daily.      Marland Kitchen topiramate (TOPAMAX) 25 MG capsule Take 2 capsules (50 mg total) by mouth 2 (two) times daily. One tab po bid xone week,  120 capsule  6   No facility-administered medications prior to visit.    PAST MEDICAL HISTORY: Past Medical History  Diagnosis Date  . Complication of anesthesia     BP RISES WHEN AWAKENING  . Heart murmur   . Chronic kidney disease     UTI'S  . Hypertension   .  GERD (gastroesophageal reflux disease)   . Headache(784.0)   . Arthritis   . Anemia     HX  . H/O hiatal hernia   . Neuromuscular disorder     MS  . Lumbar spondylolysis   . Lumbar spinal stenosis   . MS (multiple sclerosis)     PAST SURGICAL HISTORY: Past Surgical History  Procedure Laterality Date  . Uterine fibroid embolization    . Eye surgery      BLEPHAROPLASTY  . Cholecystectomy    . Posterior fusion lumbar spine  04/16/11    L4-5    FAMILY HISTORY: Family History  Problem Relation Age of Onset  . Lung cancer Mother   . Emphysema Father   . Heart disease      SOCIAL HISTORY:  History   Social History  . Marital Status: Single    Spouse Name: N/A    Number of Children: 0  . Years of Education: 14   Occupational History  .      Does not work - Disabled   Social History Main Topics  . Smoking status: Former Smoker -- 2.00 packs/day    Quit date: 04/08/1996  . Smokeless tobacco: Never Used     Comment: ALCOHOL IN PAST  . Alcohol Use: No  . Drug Use: No  . Sexual Activity: Yes    Birth Control/ Protection: Post-menopausal   Other Topics Concern  . Not on file   Social History Narrative   Patient does not work she is disabled. and lives at home. Patient is single.   Caffeine -1-2 cups daily.    Right handed.     PHYSICAL EXAM  Filed Vitals:   07/07/13 1456  BP: 123/77  Pulse:  60  Height: _0  (1.549 m)  Weight: 173 lb 8 oz (78.699 kg)    Not recorded    Body mass index is 32.8 kg/(m^2).  GENERAL EXAM: Patient is in no distress; well developed, nourished and groomed; neck is supple  CARDIOVASCULAR: Regular rate and rhythm, no murmurs, no carotid bruits  NEUROLOGIC: MENTAL STATUS: awake, alert, oriented to person, place and time, recent and remote memory intact, normal attention and concentration, language fluent, comprehension intact, naming intact, fund of knowledge appropriate CRANIAL NERVE: no papilledema on fundoscopic exam, pupils equal and reactive to light, visual fields full to confrontation, extraocular muscles intact, no nystagmus, facial sensation and strength symmetric, hearing intact, palate elevates symmetrically, uvula midline, shoulder shrug symmetric, tongue midline. MOTOR: normal bulk and tone, full strength in the BUE, BLE SENSORY: normal and symmetric to light touch, pinprick, temperature, vibration COORDINATION: finger-nose-finger, fine finger movements normal REFLEXES: deep tendon reflexes present and symmetric GAIT/STATION: narrow based gait; USES SINGLE POINT CANE.     DIAGNOSTIC DATA (LABS, IMAGING, TESTING) - I reviewed patient records, labs, notes, testing and imaging myself where available.  Lab Results  Component Value Date   WBC 4.5 04/09/2011   HGB 12.2 04/09/2011   HCT 37.2 04/09/2011   MCV 84.4 04/09/2011   PLT 199 04/09/2011      Component Value Date/Time   NA 140 04/09/2011 1035   K 3.8 04/09/2011 1035   CL 100 04/09/2011 1035   CO2 32 04/09/2011 1035   GLUCOSE 93 04/09/2011 1035   BUN 17 04/09/2011 1035   CREATININE 0.70 04/09/2011 1035   CALCIUM 9.8 04/09/2011 1035   PROT 6.7 04/06/2008 2015   ALBUMIN 3.9 04/06/2008 2015   AST 23 04/06/2008 2015   ALT 27  04/06/2008 2015   ALKPHOS 69 04/06/2008 2015   BILITOT 0.8 04/06/2008 2015   GFRNONAA >90 04/09/2011 1035   GFRAA >90 04/09/2011 1035   No results found for this  basename: CHOL, HDL, LDLCALC, LDLDIRECT, TRIG, CHOLHDL   No results found for this basename: HGBA1C   No results found for this basename: VITAMINB12   No results found for this basename: TSH    I reviewed images myself and agree with interpretation. -VRP  05/23/08 MRI brain - No acute intracranial abnormality. Advanced cerebral white matter disease and mild involvement of the deep gray matter nuclei compatible with stated history of longstanding multiple sclerosis.  02/11/07 MRI brain - multiple periventricular and subcortical, confluent chronic demyelinating plaques; some infratentorial plaques noted.  02/07/03 MRI cervical spine - focal abnormality at C3, likely chronic demyelinating plaques   ASSESSMENT AND PLAN  58 y.o. year old female here with multiple sclerosis since 1983. Stable on betaseron since early 2000's.   PLAN: - check MRI scans to assess status of MS - continue betaseron - increase gabapentin up to 160m TID for HA/migraine control   Orders Placed This Encounter  Procedures  . MR Brain W Wo Contrast  . MR Cervical Spine W Wo Contrast   Return in about 3 months (around 10/06/2013).    VPenni Bombard MD 45/85/2778 32:42PM Certified in Neurology, Neurophysiology and Neuroimaging  [facility] [id number] [place] [phone]

## 2013-07-08 ENCOUNTER — Telehealth: Payer: Self-pay | Admitting: *Deleted

## 2013-07-08 NOTE — Telephone Encounter (Signed)
Patient calling requesting mild sedative due to claustrophobia(MRI scheduled 07/25/13).  Please call pt when ready for pick up.  Thanks

## 2013-07-11 NOTE — Telephone Encounter (Signed)
Patient calling back to state that she cannot drive and has no way to come here and pick script up. Please call and advise patient.

## 2013-07-11 NOTE — Telephone Encounter (Signed)
Patient called to state that she will just get [facility] to bring her on Wednesday in order to pick up medication.

## 2013-07-11 NOTE — Telephone Encounter (Signed)
Called pt to inform her per Butch Penny, NP that the pt could come by before her MRI appt to pick up the medication. Pt stated that she would see what she could do. I advised the pt the if she has any other problems, questions or concerns to call the office. Pt verbalized understanding.

## 2013-07-11 NOTE — Telephone Encounter (Signed)
Patient calling again regarding medication for claustrophobia before scheduled MRI. Please call patient.

## 2013-07-11 NOTE — Telephone Encounter (Signed)
Called pt and left message informing her that Dr. Leta Baptist left medication at the front desk for her to pick up, to help for the MRI that pt is having and if she has any other problems, questions or concerns to call the office.

## 2013-07-25 ENCOUNTER — Other Ambulatory Visit: Payer: Medicare Other

## 2013-07-25 ENCOUNTER — Ambulatory Visit
Admission: RE | Admit: 2013-07-25 | Discharge: 2013-07-25 | Disposition: A | Discharge status: Home / Self Care | Payer: Medicare Other | Source: Ambulatory Visit | Attending: Diagnostic Neuroimaging | Admitting: Diagnostic Neuroimaging

## 2013-07-25 DIAGNOSIS — G35 Multiple sclerosis: Secondary | ICD-10-CM

## 2013-07-25 DIAGNOSIS — G43009 Migraine without aura, not intractable, without status migrainosus: Secondary | ICD-10-CM

## 2013-07-25 MED ORDER — GADOBENATE DIMEGLUMINE 529 MG/ML IV SOLN
15.0000 mL | Freq: Once | INTRAVENOUS | Status: AC | PRN
  Administered 2013-07-25: 15 mL via INTRAVENOUS

## 2013-08-01 ENCOUNTER — Telehealth: Payer: Self-pay | Admitting: Diagnostic Neuroimaging

## 2013-08-04 ENCOUNTER — Telehealth: Payer: Self-pay | Admitting: Diagnostic Neuroimaging

## 2013-08-04 NOTE — Telephone Encounter (Signed)
Patient calling to get MRI results. Thanks

## 2013-08-05 NOTE — Telephone Encounter (Signed)
MRI brain is stable. MRI cervical spine shows 2 new plaques since 2004 scan. Continue current plan. -VRP

## 2013-08-05 NOTE — Telephone Encounter (Signed)
Pt is calling requesting MRI results. Please advise °

## 2013-08-11 NOTE — Telephone Encounter (Signed)
I called and relayed the MRI results to pt.  Brain - stable, C spine- 2 new plaques.  Continue present plan of care.  She verbalized understanding.

## 2013-08-12 ENCOUNTER — Telehealth: Payer: Self-pay | Admitting: Diagnostic Neuroimaging

## 2013-08-12 NOTE — Telephone Encounter (Signed)
Patient calling regarding side effects from Betaseron--please call--thank you.

## 2013-08-12 NOTE — Telephone Encounter (Signed)
I called back to obtain more info.  Called home number, got no answer.  Left message.  I called cell, got no answer, no VM.

## 2013-08-25 ENCOUNTER — Telehealth: Payer: Self-pay | Admitting: Diagnostic Neuroimaging

## 2013-08-25 NOTE — Telephone Encounter (Signed)
I called back and spoke with the patient.  She has been using Betaseron for years and says it has always given her a rash.  Says she went to the dermatologist, who gave her cream which has never helped.  She would like Dr Gladstone Lighter okay for her to discontinue this med.  Since it has been occurring for several years, says it is not urgent matter, and she would prefer Dr Leta Baptist address it directly rather than WID.   Please advise.  Thank you.

## 2013-08-25 NOTE — Telephone Encounter (Signed)
Patient stated BETASERON 0.3 MG KIT injection is causing her to have a constant rash, which is one of the side effects.  She's questioning if medication could be changed to something else.  Please call and advise.

## 2013-08-30 NOTE — Telephone Encounter (Signed)
Pt calling back to check the status of her message. I advised her I would have someone call her back

## 2013-08-31 NOTE — Telephone Encounter (Signed)
I LMVM for pt that she is to call me back.  Noted that seen 07-07-13 with Dr. Leta Baptist no mention of rash.  Has she spoken to betaseron nurse?  I stopped other option she has thought about?

## 2013-08-31 NOTE — Telephone Encounter (Signed)
Pt called back.  She stated that she has had red scaly spots since taking the betaseron.  Has recently gotten worse.  Has spoken to betaseron nurse before and also seen dermatologists about skin conditions.  Creams have not worked.  She is wanting to change to something else if this is ok?  Last MRI C spine showed 2 new plaques.   Not wanting orals.  Has rosacea.  Please advise.  Last seen 07-07-13.

## 2013-08-31 NOTE — Telephone Encounter (Signed)
Can consider other injectables (rebif, copaxone, plegridy). Can setup follow up appt with me to discuss further, or if she decides, we can send in rx. -VRP

## 2013-09-01 NOTE — Telephone Encounter (Signed)
I called pt and relayed the message below.  Will mail some information about each and then she will decide and call us back.  I gave her MS society website to see about options, she stated she was illiterate in computer.

## 2013-09-09 ENCOUNTER — Telehealth: Payer: Self-pay | Admitting: Diagnostic Neuroimaging

## 2013-09-09 NOTE — Telephone Encounter (Signed)
Mailed out information on each below to her home address (this week).

## 2013-09-09 NOTE — Telephone Encounter (Signed)
Pt is calling back from previous calls concerning her medication BETASERON 0.3 MG KIT injection, states she is still concerned about this matter and that she would appreciate Dr. Tish Frederickson to call her to explain to her about the options and the reason behind the condition she is having. Pt doesn't have a computer and states she would rather Dr. Tish Frederickson call her than her have to come in and pay $50.00 that she doesn't have. Pt is very upset, please have Dr. Tish Frederickson call pt back. Thanks

## 2013-09-12 NOTE — Telephone Encounter (Signed)
I called pt and relayed that she got the information I mailed to her. She did.  She has not had time to go thru the information as yet.  Will call back when has and made decision.

## 2013-09-13 ENCOUNTER — Telehealth: Payer: Self-pay | Admitting: Diagnostic Neuroimaging

## 2013-09-13 NOTE — Telephone Encounter (Signed)
Pt called back would like for Dr. Nikki Dom nurse to return her call. Thanks

## 2013-09-13 NOTE — Telephone Encounter (Signed)
Called pt to get more information and pt stated that she will call back and make an appt with Dr. Leta Baptist if she needs him and hung up. Gave pt's information to [place] [facility] [place], RN attempted to call pt back and no answer and left VM.

## 2013-09-15 NOTE — Telephone Encounter (Signed)
I called pt and LM.

## 2013-09-15 NOTE — Telephone Encounter (Signed)
Patient returning Sandy's call.  °

## 2013-09-15 NOTE — Telephone Encounter (Signed)
I spoke to pt and due to rash where gets injections, offered to have [facility] nurse to come out and see if technique may be cause.  If things persist then may want to change medication.  Pt ok with this.  I called [facility] and spoke to nurse and will send form to enlist there help.

## 2013-10-25 ENCOUNTER — Encounter: Payer: Self-pay | Admitting: Diagnostic Neuroimaging

## 2013-11-25 ENCOUNTER — Ambulatory Visit: Payer: Medicare Other | Admitting: Diagnostic Neuroimaging

## 2013-12-06 ENCOUNTER — Ambulatory Visit: Payer: Medicare Other | Admitting: Diagnostic Neuroimaging

## 2013-12-28 ENCOUNTER — Encounter: Payer: Self-pay | Admitting: Diagnostic Neuroimaging

## 2013-12-28 ENCOUNTER — Encounter (INDEPENDENT_AMBULATORY_CARE_PROVIDER_SITE_OTHER): Payer: Self-pay

## 2013-12-28 ENCOUNTER — Ambulatory Visit (INDEPENDENT_AMBULATORY_CARE_PROVIDER_SITE_OTHER): Payer: Medicare Other | Admitting: Diagnostic Neuroimaging

## 2013-12-28 VITALS — BP 116/69 | HR 63 | Ht 61.5 in | Wt 171.0 lb

## 2013-12-28 DIAGNOSIS — G35 Multiple sclerosis: Secondary | ICD-10-CM
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
## 2013-12-28 NOTE — Patient Instructions (Signed)
Continue betaseron. 

## 2013-12-29 ENCOUNTER — Other Ambulatory Visit (HOSPITAL_COMMUNITY): Payer: Self-pay | Admitting: Obstetrics and Gynecology

## 2013-12-29 DIAGNOSIS — Z1231 Encounter for screening mammogram for malignant neoplasm of breast: Secondary | ICD-10-CM

## 2014-01-11 ENCOUNTER — Ambulatory Visit: Payer: Medicare Other | Admitting: Diagnostic Neuroimaging

## 2014-01-12 ENCOUNTER — Telehealth: Payer: Self-pay | Admitting: Diagnostic Neuroimaging

## 2014-01-12 NOTE — Telephone Encounter (Signed)
Patient calling to ask questions about how long she will have to continue her physical therapy, please return call and advise.

## 2014-01-19 NOTE — Telephone Encounter (Signed)
Agree. -VRP 

## 2014-01-19 NOTE — Telephone Encounter (Signed)
Spoke to patient. Advised MD places the orders, physical therapist makes the recommendations and will notify physician of any updates. Patient says she just wanted to know the process. Patient says she tripped and fell the other day. She requested time off from PT, but will resume next week. She says she is fine, just healing.

## 2014-01-23 ENCOUNTER — Telehealth: Payer: Self-pay | Admitting: Diagnostic Neuroimaging

## 2014-01-23 NOTE — Telephone Encounter (Signed)
Please go ahead with verbal order. Ok for PT. -VRP

## 2014-01-23 NOTE — Telephone Encounter (Signed)
Claiborne Billings, Physical Therapist with [facility] @ [phone], requesting verbal order to extended Home health Physical Therapy for 3 more weeks.  Please call and advise.

## 2014-01-24 NOTE — Telephone Encounter (Signed)
Larey Seat, this is for you. Please call Claiborne Billings back, she needs to know before lunch time.

## 2014-01-24 NOTE — Telephone Encounter (Signed)
Called Claiborne Billings. No answer. Left a detailed message. Given verbal order.

## 2014-01-30 ENCOUNTER — Other Ambulatory Visit: Payer: Self-pay | Admitting: Neurology

## 2014-02-01 ENCOUNTER — Ambulatory Visit (HOSPITAL_COMMUNITY): Payer: Medicare Other

## 2014-02-07 ENCOUNTER — Ambulatory Visit (HOSPITAL_COMMUNITY): Admission: RE | Admit: 2014-02-07 | Payer: Medicare Other | Source: Ambulatory Visit

## 2014-03-03 ENCOUNTER — Ambulatory Visit (HOSPITAL_COMMUNITY): Payer: Medicare Other

## 2014-03-10 ENCOUNTER — Ambulatory Visit (HOSPITAL_COMMUNITY): Payer: Medicare Other | Attending: Obstetrics and Gynecology

## 2014-03-15 ENCOUNTER — Ambulatory Visit (HOSPITAL_COMMUNITY)
Admission: RE | Admit: 2014-03-15 | Discharge: 2014-03-15 | Disposition: A | Discharge status: Home / Self Care | Payer: Medicare Other | Source: Ambulatory Visit | Attending: Obstetrics and Gynecology | Admitting: Obstetrics and Gynecology

## 2014-03-15 DIAGNOSIS — Z1231 Encounter for screening mammogram for malignant neoplasm of breast: Secondary | ICD-10-CM | POA: Insufficient documentation

## 2014-03-27 ENCOUNTER — Telehealth: Payer: Self-pay | Admitting: Diagnostic Neuroimaging

## 2014-03-27 NOTE — Telephone Encounter (Signed)
Spoke to patient. She said she was in the grocery store at the moment. She will call back when she goes home.

## 2014-03-27 NOTE — Telephone Encounter (Signed)
Spoke to patient. Advised per Dr. Leta Baptist ok to taper off Gabapentin starting 1 tab twice daily for 1 week; then 1 tab qd. Patient agreed. She wanted to know why she was placed on Gabapentin originally advised per Dr. Rhea Belton note 12/30/12 for HA's. Patient agreed. She other questions about the med. Offered patient an appt. Patient stated she would contact her PCP because that's who she thinks originally started her on Gabapentin years ago.

## 2014-03-27 NOTE — Telephone Encounter (Signed)
Patient is returning a call. °

## 2014-03-27 NOTE — Telephone Encounter (Signed)
Patient stated she's experiencing tingling in arms and legs, at times feels disoriented.  Rx gabapentin (NEURONTIN) 100 MG capsule was prescribed by PCP with instructions of 1 tab in am, 1 tab midday and 2 tabs in pm.  Dr. Leta Baptist switched dosage to 1 tablet 3 x's a day.  Patient questioning if this could cause the side effects she's experiencing.  Please call and advise.

## 2014-06-05 ENCOUNTER — Telehealth: Payer: Self-pay | Admitting: Diagnostic Neuroimaging

## 2014-06-05 NOTE — Telephone Encounter (Signed)
Patient stated [facility] will forward form that need to be completed by Dr. Leta Baptist, in order for patient to receive more than 30 day supply for Rx BETASERON 0.3 MG KIT injection.  FYI

## 2014-06-06 ENCOUNTER — Telehealth: Payer: Self-pay

## 2014-06-06 NOTE — Telephone Encounter (Signed)
[facility]) has approved the request for coverage on Betaseron effective until 11/55/2080 or until the policy changes or is terminated.   Ref # [id number]

## 2014-06-27 ENCOUNTER — Telehealth: Payer: Self-pay | Admitting: Diagnostic Neuroimaging

## 2014-06-29 ENCOUNTER — Ambulatory Visit: Payer: Self-pay | Admitting: Diagnostic Neuroimaging

## 2014-08-02 ENCOUNTER — Ambulatory Visit: Payer: Self-pay | Admitting: Diagnostic Neuroimaging

## 2014-08-04 ENCOUNTER — Other Ambulatory Visit: Payer: Self-pay | Admitting: Diagnostic Neuroimaging

## 2014-08-17 NOTE — Telephone Encounter (Signed)
I was finally able to reach the patient who said she decided to stay on one three times daily.  Has appt scheduled next month

## 2014-08-29 ENCOUNTER — Encounter: Payer: Self-pay | Admitting: Diagnostic Neuroimaging

## 2014-08-29 ENCOUNTER — Ambulatory Visit (INDEPENDENT_AMBULATORY_CARE_PROVIDER_SITE_OTHER): Payer: Medicare Other | Admitting: Diagnostic Neuroimaging

## 2014-08-29 VITALS — BP 146/79 | HR 72 | Ht 61.5 in | Wt 174.5 lb

## 2014-08-29 DIAGNOSIS — G35 Multiple sclerosis: Secondary | ICD-10-CM
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
## 2014-09-12 ENCOUNTER — Other Ambulatory Visit: Payer: Self-pay | Admitting: Diagnostic Neuroimaging

## 2014-09-27 ENCOUNTER — Other Ambulatory Visit: Payer: Self-pay | Admitting: Family Medicine

## 2014-09-27 DIAGNOSIS — M545 Low back pain: Secondary | ICD-10-CM

## 2014-09-27 DIAGNOSIS — M4800 Spinal stenosis, site unspecified: Secondary | ICD-10-CM

## 2014-10-02 ENCOUNTER — Ambulatory Visit
Admission: RE | Admit: 2014-10-02 | Discharge: 2014-10-02 | Disposition: A | Discharge status: Home / Self Care | Payer: Medicare Other | Source: Ambulatory Visit | Attending: Family Medicine | Admitting: Family Medicine

## 2014-10-02 DIAGNOSIS — M4800 Spinal stenosis, site unspecified: Secondary | ICD-10-CM

## 2014-10-02 DIAGNOSIS — M545 Low back pain: Secondary | ICD-10-CM

## 2014-11-15 ENCOUNTER — Other Ambulatory Visit: Payer: Self-pay | Admitting: Gastroenterology

## 2014-12-11 ENCOUNTER — Ambulatory Visit: Payer: Medicare Other | Admitting: Physical Therapy

## 2014-12-13 ENCOUNTER — Ambulatory Visit: Payer: Medicare Other

## 2014-12-25 ENCOUNTER — Ambulatory Visit: Payer: Medicare Other

## 2015-02-01 ENCOUNTER — Telehealth: Payer: Self-pay | Admitting: Diagnostic Neuroimaging

## 2015-02-01 NOTE — Telephone Encounter (Signed)
Spoke with patient and informed her that Dr Leta Baptist did not place her PT referral; Dr Sherley Bounds placed that referral. She stated she couldn't remember but would now contact Dr Ronnald Ramp office with her question re: PT. She then requested to reschedule her Dec FU, stated "I'm doing very well." Rescheduled her for 03/27/15. She verbalized understanding, appreciation.

## 2015-02-01 NOTE — Telephone Encounter (Signed)
Patient called regarding Dr. Leta Baptist' recommendation for someone to help her with physical therapy. The co-pays for PT are going to cost too much money. Wonders if someone could come to her home to show her some things that she could do at home.

## 2015-02-05 ENCOUNTER — Other Ambulatory Visit: Payer: Self-pay

## 2015-02-05 DIAGNOSIS — Z1231 Encounter for screening mammogram for malignant neoplasm of breast: Secondary | ICD-10-CM

## 2015-02-24 ENCOUNTER — Other Ambulatory Visit: Payer: Self-pay | Admitting: Neurology

## 2015-02-24 DIAGNOSIS — G35 Multiple sclerosis: Secondary | ICD-10-CM

## 2015-02-24 MED ORDER — INTERFERON BETA-1B 0.3 MG ~~LOC~~ KIT: PACK | SUBCUTANEOUS | Status: DC

## 2015-02-25 ENCOUNTER — Telehealth: Payer: Self-pay | Admitting: Neurology

## 2015-02-25 NOTE — Telephone Encounter (Signed)
Telephone note late entry:  Pt called yesterday that her Betaseron was the last dose yesterday. I called in for her yesterday to her pharmacy. She expressed appreciation.   Rosalin Hawking, MD PhD Stroke Neurology 02/25/2015 9:30 PM

## 2015-03-01 ENCOUNTER — Ambulatory Visit: Payer: Medicare Other | Admitting: Dietician

## 2015-03-06 ENCOUNTER — Ambulatory Visit: Payer: Medicare Other | Admitting: Diagnostic Neuroimaging

## 2015-03-07 ENCOUNTER — Ambulatory Visit: Payer: Medicare Other | Attending: Family Medicine | Admitting: Physical Therapy

## 2015-03-20 ENCOUNTER — Ambulatory Visit
Admission: RE | Admit: 2015-03-20 | Discharge: 2015-03-20 | Disposition: A | Discharge status: Home / Self Care | Payer: Medicare Other | Source: Ambulatory Visit

## 2015-03-20 DIAGNOSIS — Z1231 Encounter for screening mammogram for malignant neoplasm of breast: Secondary | ICD-10-CM

## 2015-03-27 ENCOUNTER — Ambulatory Visit: Payer: Self-pay | Admitting: Diagnostic Neuroimaging

## 2015-04-09 ENCOUNTER — Telehealth: Payer: Self-pay | Admitting: Diagnostic Neuroimaging

## 2015-04-09 NOTE — Telephone Encounter (Signed)
Pt called back. She said she is not out of medication but is concerned she might run out. She said the sciatic nerve pain is very bad. Please call. Thank you.

## 2015-04-09 NOTE — Telephone Encounter (Signed)
Patient is calling and is requesting an increase in Rx Gabapentin 100 mg from 1 tablet 3 times a day to 2 tablets 3 times per day for pain to be sent to [facility].  Please call her.  Thanks!

## 2015-04-09 NOTE — Telephone Encounter (Signed)
Patient has been taking Gabapentin 100mg  three times daily since 07/07/2013.  Due to pain, she would like to know if dose can be increased to 200mg  three times daily.  Please advise.  Thank you.

## 2015-04-10 ENCOUNTER — Ambulatory Visit: Payer: Medicare Other | Admitting: Dietician

## 2015-04-10 MED ORDER — GABAPENTIN 100 MG PO CAPS: 200.0000 mg | ORAL_CAPSULE | Freq: Three times a day (TID) | ORAL | Status: DC

## 2015-04-10 NOTE — Telephone Encounter (Signed)
Attempted to reach patient on mobile; immediately rang busy. Unable to leave message.

## 2015-04-10 NOTE — Telephone Encounter (Signed)
Attempted to reach patient again; no answer, unable to leave message. If patient calls back, phone staff may inform her that Gabapentin prescription with increase in dosage has been sent to her preferred pharmacy.

## 2015-04-10 NOTE — Telephone Encounter (Signed)
Patient is calling back about her Gabapentin increase  Please call.  Thanks!

## 2015-04-12 NOTE — Telephone Encounter (Signed)
Pt called today to thank Dr Leta Baptist for increasing dose for gabapentin. She is very grateful, thank you!

## 2015-04-17 ENCOUNTER — Other Ambulatory Visit (HOSPITAL_COMMUNITY): Payer: Self-pay | Admitting: Neurological Surgery

## 2015-04-30 ENCOUNTER — Ambulatory Visit: Payer: Medicare Other | Admitting: Diagnostic Neuroimaging

## 2015-05-02 ENCOUNTER — Encounter (HOSPITAL_COMMUNITY): Payer: Self-pay

## 2015-05-02 ENCOUNTER — Encounter (HOSPITAL_COMMUNITY)
Admission: RE | Admit: 2015-05-02 | Discharge: 2015-05-02 | Disposition: A | Discharge status: Home / Self Care | Payer: Medicare Other | Source: Ambulatory Visit | Attending: Neurological Surgery | Admitting: Neurological Surgery

## 2015-05-02 DIAGNOSIS — K219 Gastro-esophageal reflux disease without esophagitis: Secondary | ICD-10-CM | POA: Diagnosis not present

## 2015-05-02 DIAGNOSIS — Z01818 Encounter for other preprocedural examination: Secondary | ICD-10-CM | POA: Insufficient documentation

## 2015-05-02 DIAGNOSIS — I10 Essential (primary) hypertension: Secondary | ICD-10-CM | POA: Diagnosis not present

## 2015-05-02 DIAGNOSIS — Z87891 Personal history of nicotine dependence: Secondary | ICD-10-CM | POA: Diagnosis not present

## 2015-05-02 DIAGNOSIS — M4806 Spinal stenosis, lumbar region: Secondary | ICD-10-CM | POA: Diagnosis not present

## 2015-05-02 DIAGNOSIS — Z0183 Encounter for blood typing: Secondary | ICD-10-CM | POA: Insufficient documentation

## 2015-05-02 DIAGNOSIS — Z981 Arthrodesis status: Secondary | ICD-10-CM | POA: Diagnosis not present

## 2015-05-02 DIAGNOSIS — Z79899 Other long term (current) drug therapy: Secondary | ICD-10-CM | POA: Diagnosis not present

## 2015-05-02 DIAGNOSIS — M48061 Spinal stenosis, lumbar region without neurogenic claudication: Secondary | ICD-10-CM

## 2015-05-02 DIAGNOSIS — G35 Multiple sclerosis: Secondary | ICD-10-CM | POA: Diagnosis not present

## 2015-05-02 DIAGNOSIS — Z01812 Encounter for preprocedural laboratory examination: Secondary | ICD-10-CM | POA: Diagnosis not present

## 2015-05-02 HISTORY — DX: Anxiety disorder, unspecified: F41.9

## 2015-05-02 HISTORY — DX: Frequency of micturition: R35.0

## 2015-05-02 HISTORY — DX: Presence of spectacles and contact lenses: Z97.3

## 2015-05-02 LAB — BASIC METABOLIC PANEL
Anion gap: 10 (ref 5–15)
BUN: 16 mg/dL (ref 6–20)
CO2: 31 mmol/L (ref 22–32)
Calcium: 10.2 mg/dL (ref 8.9–10.3)
Chloride: 100 mmol/L — ABNORMAL LOW (ref 101–111)
Creatinine, Ser: 0.72 mg/dL (ref 0.44–1.00)
GFR calc Af Amer: >60 mL/min (ref >60)
GLUCOSE: 85 mg/dL (ref 65–99)
POTASSIUM: 3.8 mmol/L (ref 3.5–5.1)
Sodium: 141 mmol/L (ref 135–145)

## 2015-05-02 LAB — CBC WITH DIFFERENTIAL/PLATELET
BASOS ABS: 0 10*3/uL (ref 0.0–0.1)
BASOS PCT: 1 %
EOS PCT: 3 %
Eosinophils Absolute: 0.1 10*3/uL (ref 0.0–0.7)
HEMATOCRIT: 38 % (ref 36.0–46.0)
Hemoglobin: 12.1 g/dL (ref 12.0–15.0)
Lymphocytes Relative: 23 %
Lymphs Abs: 0.9 10*3/uL (ref 0.7–4.0)
MCH: 27.4 pg (ref 26.0–34.0)
MCHC: 31.8 g/dL (ref 30.0–36.0)
MCV: 86.2 fL (ref 78.0–100.0)
MONO ABS: 0.4 10*3/uL (ref 0.1–1.0)
MONOS PCT: 10 %
NEUTROS ABS: 2.3 10*3/uL (ref 1.7–7.7)
Neutrophils Relative %: 63 %
PLATELETS: 253 10*3/uL (ref 150–400)
RBC: 4.41 MIL/uL (ref 3.87–5.11)
RDW: 14.3 % (ref 11.5–15.5)
WBC: 3.7 10*3/uL — ABNORMAL LOW (ref 4.0–10.5)

## 2015-05-02 LAB — TYPE AND SCREEN
ABO/RH(D): O POS
Antibody Screen: NEGATIVE

## 2015-05-02 LAB — SURGICAL PCR SCREEN
MRSA, PCR: POSITIVE — AB
STAPHYLOCOCCUS AUREUS: POSITIVE — AB

## 2015-05-02 LAB — PROTIME-INR
INR: 0.95 (ref 0.00–1.49)
Prothrombin Time: 12.9 seconds (ref 11.6–15.2)

## 2015-05-02 LAB — GLUCOSE, CAPILLARY: GLUCOSE-CAPILLARY: 86 mg/dL (ref 65–99)

## 2015-05-02 NOTE — Pre-Procedure Instructions (Signed)
    [name]  05/02/2015      [facility] - [place], [place] [address] Phone: [phone] Fax: [place], [place] - [address] [place] [address] Phone: [phone] Fax: [phone]    Your procedure is scheduled on Thursday, February 16th, 2017.  Report to [facility] Admitting at 7:30 A.M.  Call this number if you have problems the morning of surgery:  [phone]   Remember:  Do not eat food or drink liquids after midnight.   Take these medicines the morning of surgery with A SIP OF WATER: Amlodipine (Norvasc), Baclofen (Lioresal), Duloxetine (Cymbalta), Fluticasone (Flonase), Gabapentin (Neurontin), Interferon Beta- 1b, Nexium, eye drops if needed, nasal spray if needed, Tramadol (Ultram) if needed.  Stop taking the following 5-7 days prior to surgery: Ibuprofen, Advil, Motrin, Aspirin, NSAISD, Aleve, Naproxen, BC's, Goody's, fish oil, all herbal medications, and all vitamins.    Do not wear jewelry, make-up or nail polish.  Do not wear lotions, powders, or perfumes.  You may NOT wear deodorant.  Do not shave 48 hours prior to surgery.    Do not bring valuables to the hospital.  [facility] is not responsible for any belongings or valuables.  Contacts, dentures or bridgework may not be worn into surgery.  Leave your suitcase in the car.  After surgery it may be brought to your room.  For patients admitted to the hospital, discharge time will be determined by your treatment team.  Patients discharged the day of surgery will not be allowed to drive home.   Special instructions:  See attached.   Please read over the following fact sheets that you were given. Pain Booklet, Coughing and Deep Breathing, Blood Transfusion Information, MRSA Information and Surgical Site Infection Prevention

## 2015-05-02 NOTE — Progress Notes (Signed)
Patient notified of PCR results and verbalized understanding.  Prescription called into [facility].

## 2015-05-02 NOTE — Progress Notes (Signed)
PCP - Dr. Carol Ada at [facility] - denies  EKG - 05/02/15 CXR - 05/02/15  Echo/Stress test/Cardiac cath - denies  Patient denies chest pain and shortness of breath at PAT appointment.    Patient states that she is prediabetic but it is currently diet controlled.  Patient states that she checks her blood sugar approximately 3 times a week, fasting glucose is 120's.

## 2015-05-04 NOTE — Progress Notes (Signed)
Anesthesia Chart Review:  Pt is a 60 year old female scheduled for transforaminal lumbar interbody fusion on 05/10/2015 with Dr. Ronnald Ramp.   PCP is Dr. Carol Ada  PMH includes:  Heart murmur (see 2008 echo report below), HTN, MS, anemia, GERD. Former smoker. BMI 31. S/p posterior lumbar fusion 04/16/11.  Medications include: amlodipine, lipitor, lisinopril-hctz, nexium, potassium  Preoperative labs reviewed.    Chest x-ray 05/02/15 reviewed. Mild chronic bronchitic changes likely related to the patient's smoking history. There is no acute cardiopulmonary abnormality.  EKG will be obtained DOS. Comparison from 2012 on paper chart   Echo 12/12/06: - Normal LV systolic function, EF [id number] - mild LVH - Diastolic dysfunction - MVP with mild mitral regurgitation - Aortic sclerosis with mild aortic regurgitation  Spoke with pt by telephone. She is concerned about side effects of anesthesia, since she emerged from anesthesia the last time she had surgery feeling like bugs were crawling on her skin. She was very upset by this.   If no changes, I anticipate pt can proceed with surgery as scheduled.   Willeen Cass, FNP-BC [facility]/Anesthesiology Phone: [phone] 05/04/2015 11:27 AM

## 2015-05-05 LAB — HEMOGLOBIN A1C
HEMOGLOBIN A1C: 5.7 % — AB (ref 4.8–5.6)
Mean Plasma Glucose: 117 mg/dL

## 2015-05-09 MED ORDER — DEXAMETHASONE SODIUM PHOSPHATE 10 MG/ML IJ SOLN
10.0000 mg | INTRAMUSCULAR | Status: DC
  Filled 2015-05-09: qty 1

## 2015-05-09 MED ORDER — CEFAZOLIN SODIUM-DEXTROSE 2-3 GM-% IV SOLR
2.0000 g | INTRAVENOUS | Status: AC
  Administered 2015-05-10: 2 g via INTRAVENOUS
  Filled 2015-05-09: qty 50

## 2015-05-10 ENCOUNTER — Encounter (HOSPITAL_COMMUNITY): Payer: Self-pay | Admitting: *Deleted

## 2015-05-10 ENCOUNTER — Inpatient Hospital Stay (HOSPITAL_COMMUNITY): Payer: Medicare Other

## 2015-05-10 ENCOUNTER — Inpatient Hospital Stay (HOSPITAL_COMMUNITY)
Admission: RE | Admit: 2015-05-10 | Discharge: 2015-05-13 | DRG: 460 | Disposition: A | Discharge status: Home / Self Care | Payer: Medicare Other | Source: Ambulatory Visit | Attending: Neurological Surgery | Admitting: Neurological Surgery

## 2015-05-10 ENCOUNTER — Inpatient Hospital Stay (HOSPITAL_COMMUNITY): Payer: Medicare Other | Admitting: Certified Registered"

## 2015-05-10 ENCOUNTER — Encounter (HOSPITAL_COMMUNITY)
Admission: RE | Disposition: A | Discharge status: Home / Self Care | Payer: Self-pay | Source: Ambulatory Visit | Attending: Neurological Surgery

## 2015-05-10 ENCOUNTER — Inpatient Hospital Stay (HOSPITAL_COMMUNITY): Payer: Medicare Other | Admitting: Emergency Medicine

## 2015-05-10 DIAGNOSIS — F419 Anxiety disorder, unspecified: Secondary | ICD-10-CM | POA: Diagnosis present

## 2015-05-10 DIAGNOSIS — Z79899 Other long term (current) drug therapy: Secondary | ICD-10-CM | POA: Diagnosis not present

## 2015-05-10 DIAGNOSIS — K219 Gastro-esophageal reflux disease without esophagitis: Secondary | ICD-10-CM | POA: Diagnosis present

## 2015-05-10 DIAGNOSIS — M1990 Unspecified osteoarthritis, unspecified site: Secondary | ICD-10-CM | POA: Diagnosis present

## 2015-05-10 DIAGNOSIS — M4306 Spondylolysis, lumbar region: Secondary | ICD-10-CM | POA: Diagnosis present

## 2015-05-10 DIAGNOSIS — G35 Multiple sclerosis: Secondary | ICD-10-CM | POA: Diagnosis present

## 2015-05-10 DIAGNOSIS — I10 Essential (primary) hypertension: Secondary | ICD-10-CM | POA: Diagnosis present

## 2015-05-10 DIAGNOSIS — M4806 Spinal stenosis, lumbar region: Secondary | ICD-10-CM | POA: Diagnosis present

## 2015-05-10 DIAGNOSIS — M47896 Other spondylosis, lumbar region: Secondary | ICD-10-CM | POA: Diagnosis present

## 2015-05-10 DIAGNOSIS — Z87891 Personal history of nicotine dependence: Secondary | ICD-10-CM | POA: Diagnosis not present

## 2015-05-10 DIAGNOSIS — M549 Dorsalgia, unspecified: Secondary | ICD-10-CM

## 2015-05-10 DIAGNOSIS — Z981 Arthrodesis status: Secondary | ICD-10-CM | POA: Diagnosis not present

## 2015-05-10 HISTORY — PX: TRANSFORAMINAL LUMBAR INTERBODY FUSION (TLIF) WITH PEDICLE SCREW FIXATION 1 LEVEL: SHX6141

## 2015-05-10 HISTORY — PX: LUMBAR FUSION: SHX111

## 2015-05-10 LAB — GLUCOSE, CAPILLARY: Glucose-Capillary: 126 mg/dL — ABNORMAL HIGH (ref 65–99)

## 2015-05-10 SURGERY — TRANSFORAMINAL LUMBAR INTERBODY FUSION (TLIF) WITH PEDICLE SCREW FIXATION 1 LEVEL
Anesthesia: General | Site: Back

## 2015-05-10 MED ORDER — THROMBIN 20000 UNITS EX SOLR
CUTANEOUS | Status: DC | PRN
  Administered 2015-05-10: 20 mL via TOPICAL

## 2015-05-10 MED ORDER — PHENYLEPHRINE HCL 10 MG/ML IJ SOLN
10.0000 mg | INTRAVENOUS | Status: DC | PRN
  Administered 2015-05-10: 10 ug/min via INTRAVENOUS

## 2015-05-10 MED ORDER — LISINOPRIL-HYDROCHLOROTHIAZIDE 20-25 MG PO TABS: 1.0000 | ORAL_TABLET | Freq: Every day | ORAL | Status: DC

## 2015-05-10 MED ORDER — VANCOMYCIN HCL 1000 MG IV SOLR
INTRAVENOUS | Status: DC | PRN
  Administered 2015-05-10: 1000 mg

## 2015-05-10 MED ORDER — MIDAZOLAM HCL 2 MG/2ML IJ SOLN
INTRAMUSCULAR | Status: AC
  Filled 2015-05-10: qty 2

## 2015-05-10 MED ORDER — PROPOFOL 1000 MG/100ML IV EMUL
INTRAVENOUS | Status: AC
  Filled 2015-05-10: qty 100

## 2015-05-10 MED ORDER — MONTELUKAST SODIUM 10 MG PO TABS
10.0000 mg | ORAL_TABLET | Freq: Every day | ORAL | Status: DC
  Administered 2015-05-10 – 2015-05-12 (×3): 10 mg via ORAL
  Filled 2015-05-10 (×3): qty 1

## 2015-05-10 MED ORDER — BUPIVACAINE HCL (PF) 0.25 % IJ SOLN
INTRAMUSCULAR | Status: DC | PRN
  Administered 2015-05-10: 4 mL

## 2015-05-10 MED ORDER — BACLOFEN 10 MG PO TABS
20.0000 mg | ORAL_TABLET | Freq: Once | ORAL | Status: AC
  Administered 2015-05-10: 20 mg via ORAL
  Filled 2015-05-10: qty 2

## 2015-05-10 MED ORDER — SODIUM CHLORIDE 0.9 % IV SOLN
250.0000 mL | INTRAVENOUS | Status: DC
Start: 2015-05-10 — End: 2015-05-13

## 2015-05-10 MED ORDER — ACETAMINOPHEN 650 MG RE SUPP: 650.0000 mg | RECTAL | Status: DC | PRN

## 2015-05-10 MED ORDER — ROCURONIUM BROMIDE 50 MG/5ML IV SOLN
INTRAVENOUS | Status: AC
  Filled 2015-05-10: qty 1

## 2015-05-10 MED ORDER — VANCOMYCIN HCL 1000 MG IV SOLR
INTRAVENOUS | Status: AC
  Filled 2015-05-10: qty 1000

## 2015-05-10 MED ORDER — PROMETHAZINE HCL 25 MG/ML IJ SOLN: 6.2500 mg | INTRAMUSCULAR | Status: DC | PRN

## 2015-05-10 MED ORDER — POTASSIUM CHLORIDE IN NACL 20-0.9 MEQ/L-% IV SOLN
INTRAVENOUS | Status: DC
  Administered 2015-05-10: 19:00:00 via INTRAVENOUS
  Filled 2015-05-10 (×7): qty 1000

## 2015-05-10 MED ORDER — DEXMEDETOMIDINE HCL IN NACL 200 MCG/50ML IV SOLN
INTRAVENOUS | Status: AC
  Filled 2015-05-10: qty 50

## 2015-05-10 MED ORDER — FENTANYL CITRATE (PF) 100 MCG/2ML IJ SOLN
INTRAMUSCULAR | Status: DC | PRN
  Administered 2015-05-10 (×5): 50 ug via INTRAVENOUS

## 2015-05-10 MED ORDER — LACTATED RINGERS IV SOLN
INTRAVENOUS | Status: DC
  Administered 2015-05-10 (×3): via INTRAVENOUS

## 2015-05-10 MED ORDER — MORPHINE SULFATE (PF) 2 MG/ML IV SOLN
1.0000 mg | INTRAVENOUS | Status: DC | PRN
  Administered 2015-05-10: 2 mg via INTRAVENOUS
  Administered 2015-05-11: 4 mg via INTRAVENOUS
  Filled 2015-05-10: qty 2
  Filled 2015-05-10 (×2): qty 1

## 2015-05-10 MED ORDER — AMLODIPINE BESYLATE 10 MG PO TABS
10.0000 mg | ORAL_TABLET | Freq: Every day | ORAL | Status: DC
  Administered 2015-05-11 – 2015-05-13 (×3): 10 mg via ORAL
  Filled 2015-05-10 (×3): qty 1

## 2015-05-10 MED ORDER — SODIUM CHLORIDE 0.9% FLUSH
3.0000 mL | Freq: Two times a day (BID) | INTRAVENOUS | Status: DC
  Administered 2015-05-11 – 2015-05-12 (×2): 3 mL via INTRAVENOUS

## 2015-05-10 MED ORDER — MENTHOL 3 MG MT LOZG: 1.0000 | LOZENGE | OROMUCOSAL | Status: DC | PRN

## 2015-05-10 MED ORDER — CELECOXIB 200 MG PO CAPS
200.0000 mg | ORAL_CAPSULE | Freq: Two times a day (BID) | ORAL | Status: DC
  Administered 2015-05-10 – 2015-05-13 (×6): 200 mg via ORAL
  Filled 2015-05-10 (×6): qty 1

## 2015-05-10 MED ORDER — HYDROMORPHONE HCL 1 MG/ML IJ SOLN
INTRAMUSCULAR | Status: AC
  Filled 2015-05-10: qty 1

## 2015-05-10 MED ORDER — ACETAMINOPHEN 325 MG PO TABS: 650.0000 mg | ORAL_TABLET | ORAL | Status: DC | PRN

## 2015-05-10 MED ORDER — OXYCODONE-ACETAMINOPHEN 5-325 MG PO TABS
1.0000 | ORAL_TABLET | ORAL | Status: DC | PRN
  Administered 2015-05-10 (×3): 1 via ORAL
  Administered 2015-05-11 – 2015-05-12 (×6): 2 via ORAL
  Filled 2015-05-10 (×2): qty 1
  Filled 2015-05-10 (×5): qty 2
  Filled 2015-05-10 (×2): qty 1
  Filled 2015-05-10: qty 2

## 2015-05-10 MED ORDER — ONDANSETRON HCL 4 MG/2ML IJ SOLN: 4.0000 mg | INTRAMUSCULAR | Status: DC | PRN

## 2015-05-10 MED ORDER — TRAZODONE HCL 100 MG PO TABS
100.0000 mg | ORAL_TABLET | Freq: Every day | ORAL | Status: DC
  Administered 2015-05-10 – 2015-05-12 (×3): 100 mg via ORAL
  Filled 2015-05-10 (×3): qty 1

## 2015-05-10 MED ORDER — ROCURONIUM BROMIDE 100 MG/10ML IV SOLN
INTRAVENOUS | Status: DC | PRN
  Administered 2015-05-10: 50 mg via INTRAVENOUS
  Administered 2015-05-10: 10 mg via INTRAVENOUS

## 2015-05-10 MED ORDER — SUCCINYLCHOLINE CHLORIDE 20 MG/ML IJ SOLN
INTRAMUSCULAR | Status: AC
  Filled 2015-05-10: qty 1

## 2015-05-10 MED ORDER — LIDOCAINE HCL (CARDIAC) 20 MG/ML IV SOLN
INTRAVENOUS | Status: DC | PRN
  Administered 2015-05-10: 80 mg via INTRAVENOUS

## 2015-05-10 MED ORDER — ONDANSETRON HCL 4 MG/2ML IJ SOLN
INTRAMUSCULAR | Status: DC | PRN
  Administered 2015-05-10: 4 mg via INTRAVENOUS

## 2015-05-10 MED ORDER — BACLOFEN 10 MG PO TABS
20.0000 mg | ORAL_TABLET | Freq: Three times a day (TID) | ORAL | Status: DC
  Administered 2015-05-10 – 2015-05-13 (×8): 20 mg via ORAL
  Filled 2015-05-10 (×8): qty 2

## 2015-05-10 MED ORDER — HYDROMORPHONE HCL 1 MG/ML IJ SOLN
0.2500 mg | INTRAMUSCULAR | Status: DC | PRN
  Administered 2015-05-10: 0.5 mg via INTRAVENOUS

## 2015-05-10 MED ORDER — PROPOFOL 10 MG/ML IV BOLUS
INTRAVENOUS | Status: AC
  Filled 2015-05-10: qty 20

## 2015-05-10 MED ORDER — 0.9 % SODIUM CHLORIDE (POUR BTL) OPTIME
TOPICAL | Status: DC | PRN
  Administered 2015-05-10: 1000 mL

## 2015-05-10 MED ORDER — TIZANIDINE HCL 4 MG PO TABS: 4.0000 mg | ORAL_TABLET | Freq: Three times a day (TID) | ORAL | Status: DC | PRN

## 2015-05-10 MED ORDER — PROPOFOL 10 MG/ML IV BOLUS
INTRAVENOUS | Status: DC | PRN
  Administered 2015-05-10: 50 mg via INTRAVENOUS
  Administered 2015-05-10: 100 mg via INTRAVENOUS

## 2015-05-10 MED ORDER — NEOSTIGMINE METHYLSULFATE 10 MG/10ML IV SOLN
INTRAVENOUS | Status: DC | PRN
  Administered 2015-05-10: 4 mg via INTRAVENOUS

## 2015-05-10 MED ORDER — CEFAZOLIN SODIUM 1-5 GM-% IV SOLN
1.0000 g | Freq: Three times a day (TID) | INTRAVENOUS | Status: AC
  Administered 2015-05-10 – 2015-05-11 (×2): 1 g via INTRAVENOUS
  Filled 2015-05-10 (×2): qty 50

## 2015-05-10 MED ORDER — ACETAMINOPHEN 10 MG/ML IV SOLN
INTRAVENOUS | Status: AC
  Administered 2015-05-10: 1000 mg via INTRAVENOUS
  Filled 2015-05-10: qty 100

## 2015-05-10 MED ORDER — THROMBIN 5000 UNITS EX SOLR
OROMUCOSAL | Status: DC | PRN
  Administered 2015-05-10: 10 mL via TOPICAL

## 2015-05-10 MED ORDER — SODIUM CHLORIDE 0.9 % IR SOLN
Status: DC | PRN
  Administered 2015-05-10: 500 mL

## 2015-05-10 MED ORDER — GABAPENTIN 100 MG PO CAPS
200.0000 mg | ORAL_CAPSULE | Freq: Three times a day (TID) | ORAL | Status: DC
  Administered 2015-05-10 – 2015-05-13 (×8): 200 mg via ORAL
  Filled 2015-05-10 (×8): qty 2

## 2015-05-10 MED ORDER — DEXMEDETOMIDINE HCL IN NACL 200 MCG/50ML IV SOLN
INTRAVENOUS | Status: DC | PRN
  Administered 2015-05-10 (×4): 4 ug via INTRAVENOUS

## 2015-05-10 MED ORDER — PHENYLEPHRINE HCL 10 MG/ML IJ SOLN
INTRAMUSCULAR | Status: DC | PRN
  Administered 2015-05-10: 80 ug via INTRAVENOUS

## 2015-05-10 MED ORDER — DULOXETINE HCL 60 MG PO CPEP
60.0000 mg | ORAL_CAPSULE | Freq: Every day | ORAL | Status: DC
Start: 2015-05-11 — End: 2015-05-13
  Administered 2015-05-11 – 2015-05-13 (×3): 60 mg via ORAL
  Filled 2015-05-10 (×3): qty 1

## 2015-05-10 MED ORDER — SODIUM CHLORIDE 0.9% FLUSH
3.0000 mL | INTRAVENOUS | Status: DC | PRN
  Administered 2015-05-10: 3 mL via INTRAVENOUS
  Filled 2015-05-10: qty 3

## 2015-05-10 MED ORDER — FENTANYL CITRATE (PF) 250 MCG/5ML IJ SOLN
INTRAMUSCULAR | Status: AC
  Filled 2015-05-10: qty 5

## 2015-05-10 MED ORDER — GLYCOPYRROLATE 0.2 MG/ML IJ SOLN
INTRAMUSCULAR | Status: DC | PRN
  Administered 2015-05-10: 0.6 mg via INTRAVENOUS

## 2015-05-10 MED ORDER — POTASSIUM CHLORIDE CRYS ER 10 MEQ PO TBCR
10.0000 meq | EXTENDED_RELEASE_TABLET | Freq: Every day | ORAL | Status: DC
  Administered 2015-05-11 – 2015-05-13 (×3): 10 meq via ORAL
  Filled 2015-05-10 (×4): qty 1

## 2015-05-10 MED ORDER — PANTOPRAZOLE SODIUM 40 MG PO TBEC
40.0000 mg | DELAYED_RELEASE_TABLET | Freq: Every day | ORAL | Status: DC
Start: 2015-05-10 — End: 2015-05-13
  Administered 2015-05-10 – 2015-05-13 (×4): 40 mg via ORAL
  Filled 2015-05-10 (×4): qty 1

## 2015-05-10 MED ORDER — LISINOPRIL 20 MG PO TABS
20.0000 mg | ORAL_TABLET | Freq: Every day | ORAL | Status: DC
  Administered 2015-05-11 – 2015-05-13 (×3): 20 mg via ORAL
  Filled 2015-05-10 (×3): qty 1

## 2015-05-10 MED ORDER — HYDROCHLOROTHIAZIDE 25 MG PO TABS
25.0000 mg | ORAL_TABLET | Freq: Every day | ORAL | Status: DC
  Administered 2015-05-11 – 2015-05-13 (×3): 25 mg via ORAL
  Filled 2015-05-10 (×3): qty 1

## 2015-05-10 MED ORDER — PHENYLEPHRINE 40 MCG/ML (10ML) SYRINGE FOR IV PUSH (FOR BLOOD PRESSURE SUPPORT)
PREFILLED_SYRINGE | INTRAVENOUS | Status: AC
  Filled 2015-05-10: qty 10

## 2015-05-10 MED ORDER — PHENOL 1.4 % MT LIQD
1.0000 | OROMUCOSAL | Status: DC | PRN
Start: 2015-05-10 — End: 2015-05-13

## 2015-05-10 SURGICAL SUPPLY — 55 items
APL SKNCLS STERI-STRIP NONHPOA (GAUZE/BANDAGES/DRESSINGS) ×1
BAG DECANTER FOR FLEXI CONT (MISCELLANEOUS) ×2 IMPLANT
BENZOIN TINCTURE PRP APPL 2/3 (GAUZE/BANDAGES/DRESSINGS) ×2 IMPLANT
BLADE CLIPPER SURG (BLADE) IMPLANT
BONE CANC CHIPS 20CC PCAN1/4 (Bone Implant) ×2 IMPLANT
BUR MATCHSTICK NEURO 3.0 LAGG (BURR) ×2 IMPLANT
CAGE ALTERA 8X12-8 (Cage) ×1 IMPLANT
CANISTER SUCT 3000ML PPV (MISCELLANEOUS) ×2 IMPLANT
CHIPS CANC BONE 20CC PCAN1/4 (Bone Implant) ×1 IMPLANT
CONT SPEC 4OZ CLIKSEAL STRL BL (MISCELLANEOUS) ×2 IMPLANT
COVER BACK TABLE 60X90IN (DRAPES) ×2 IMPLANT
DRAPE C-ARM 42X72 X-RAY (DRAPES) ×4 IMPLANT
DRAPE LAPAROTOMY 100X72X124 (DRAPES) ×2 IMPLANT
DRAPE POUCH INSTRU U-SHP 10X18 (DRAPES) ×2 IMPLANT
DRAPE SURG 17X23 STRL (DRAPES) ×2 IMPLANT
DRSG OPSITE POSTOP 4X6 (GAUZE/BANDAGES/DRESSINGS) ×2 IMPLANT
DURAPREP 26ML APPLICATOR (WOUND CARE) ×2 IMPLANT
ELECT REM PT RETURN 9FT ADLT (ELECTROSURGICAL) ×2
ELECTRODE REM PT RTRN 9FT ADLT (ELECTROSURGICAL) ×1 IMPLANT
EVACUATOR 1/8 PVC DRAIN (DRAIN) ×2 IMPLANT
GAUZE SPONGE 4X4 16PLY XRAY LF (GAUZE/BANDAGES/DRESSINGS) IMPLANT
GLOVE BIO SURGEON STRL SZ8 (GLOVE) ×4 IMPLANT
GOWN STRL REUS W/ TWL LRG LVL3 (GOWN DISPOSABLE) IMPLANT
GOWN STRL REUS W/ TWL XL LVL3 (GOWN DISPOSABLE) ×2 IMPLANT
GOWN STRL REUS W/TWL 2XL LVL3 (GOWN DISPOSABLE) IMPLANT
GOWN STRL REUS W/TWL LRG LVL3 (GOWN DISPOSABLE)
GOWN STRL REUS W/TWL XL LVL3 (GOWN DISPOSABLE) ×4
HEMOSTAT POWDER KIT SURGIFOAM (HEMOSTASIS) IMPLANT
KIT BASIN OR (CUSTOM PROCEDURE TRAY) ×2 IMPLANT
KIT INFUSE X SMALL 1.4CC (Orthopedic Implant) ×2 IMPLANT
KIT ROOM TURNOVER OR (KITS) ×2 IMPLANT
LIQUID BAND (GAUZE/BANDAGES/DRESSINGS) ×1 IMPLANT
MILL MEDIUM DISP (BLADE) IMPLANT
NDL HYPO 25X1 1.5 SAFETY (NEEDLE) ×1 IMPLANT
NEEDLE HYPO 25X1 1.5 SAFETY (NEEDLE) ×2 IMPLANT
NS IRRIG 1000ML POUR BTL (IV SOLUTION) ×2 IMPLANT
PACK LAMINECTOMY NEURO (CUSTOM PROCEDURE TRAY) ×2 IMPLANT
PAD ARMBOARD 7.5X6 YLW CONV (MISCELLANEOUS) ×6 IMPLANT
ROD EXPEDIUM PRE BENT 55MM (Rod) ×1 IMPLANT
ROD PRE LORDOSED 5.5X45 (Rod) ×2 IMPLANT
SCREW EXPEDIUM POLYAXIAL 6X45M (Screw) ×2 IMPLANT
SCREW SET SINGLE INNER (Screw) ×8 IMPLANT
SPONGE LAP 4X18 X RAY DECT (DISPOSABLE) IMPLANT
SPONGE SURGIFOAM ABS GEL 100 (HEMOSTASIS) ×2 IMPLANT
STRIP CLOSURE SKIN 1/2X4 (GAUZE/BANDAGES/DRESSINGS) ×4 IMPLANT
SUT VIC AB 0 CT1 18XCR BRD8 (SUTURE) ×1 IMPLANT
SUT VIC AB 0 CT1 8-18 (SUTURE) ×2
SUT VIC AB 2-0 CP2 18 (SUTURE) ×2 IMPLANT
SUT VIC AB 3-0 SH 8-18 (SUTURE) ×4 IMPLANT
TAPE STRIPS DRAPE STRL (GAUZE/BANDAGES/DRESSINGS) ×1 IMPLANT
TOWEL OR 17X24 6PK STRL BLUE (TOWEL DISPOSABLE) ×2 IMPLANT
TOWEL OR 17X26 10 PK STRL BLUE (TOWEL DISPOSABLE) ×2 IMPLANT
TRAP SPECIMEN MUCOUS 40CC (MISCELLANEOUS) IMPLANT
TRAY FOLEY W/METER SILVER 14FR (SET/KITS/TRAYS/PACK) ×2 IMPLANT
WATER STERILE IRR 1000ML POUR (IV SOLUTION) ×2 IMPLANT

## 2015-05-10 NOTE — H&P (Signed)
Subjective: Patient is a 60 y.o. female admitted for TLIF. Onset of symptoms was several months ago, gradually worsening since that time.  The pain is rated severe, and is located at the across the lower back and radiates to RLE. The pain is described as aching and occurs all day. The symptoms have been progressive. Symptoms are exacerbated by exercise. MRI or CT showed adjacent tendinosis L3-4 with disc herniation   Past Medical History  Diagnosis Date  . Heart murmur   . Chronic kidney disease     UTI'S  . Hypertension   . GERD (gastroesophageal reflux disease)   . Headache(784.0)   . Arthritis   . Anemia     HX  . H/O hiatal hernia   . Neuromuscular disorder (HCC)     MS  . Lumbar spondylolysis   . Lumbar spinal stenosis   . MS (multiple sclerosis) ([place])   . Complication of anesthesia     BP RISES WHEN AWAKENING  . Complication of anesthesia     "difficult waking up and can become combative"   . Urinary frequency   . Wears glasses   . Anxiety     Past Surgical History  Procedure Laterality Date  . Uterine fibroid embolization    . Eye surgery      BLEPHAROPLASTY  . Cholecystectomy    . Posterior fusion lumbar spine  04/16/11    L4-5    Prior to Admission medications   Medication Sig Start Date End Date Taking? Authorizing Provider  amLODipine (NORVASC) 10 MG tablet Take 10 mg by mouth daily.     Yes Historical Provider, MD  atorvastatin (LIPITOR) 20 MG tablet Take 20 mg by mouth daily.     Yes Historical Provider, MD  baclofen (LIORESAL) 20 MG tablet Take 20 mg by mouth 3 (three) times daily. 1 tab twice daily, and 2 tablets before bedtime   Yes Historical Provider, MD  Calcium Citrate-Vitamin D (CALCIUM + D PO) Take 1 capsule by mouth daily.   Yes Historical Provider, MD  DULoxetine (CYMBALTA) 60 MG capsule Take 1 capsule by mouth daily. 12/23/13  Yes Historical Provider, MD  ENSURE (ENSURE) Take 237 mLs by mouth 2 (two) times daily as needed.   Yes Historical  Provider, MD  fluticasone (FLONASE) 50 MCG/ACT nasal spray Place 2 sprays into the nose 2 (two) times daily as needed. For stuffy nose.    Yes Historical Provider, MD  gabapentin (NEURONTIN) 100 MG capsule Take 2 capsules (200 mg total) by mouth 3 (three) times daily. 04/10/15  Yes Penni Bombard, MD  ibuprofen (ADVIL,MOTRIN) 600 MG tablet Take 1 tablet by mouth every 8 (eight) hours as needed for mild pain or moderate pain.  06/23/13  Yes Historical Provider, MD  Interferon Beta-1b (BETASERON) 0.3 MG KIT injection RECONSTITUTE WITH 1.2 ML DILUENT AND INJECT 1 ML OR 0.'25MG'$  SUBCUTANEOUSLY EVERY OTHER DAY 02/24/15  Yes Rosalin Hawking, MD  lisinopril-hydrochlorothiazide (PRINZIDE,ZESTORETIC) 20-25 MG per tablet Take 1 tablet by mouth daily.     Yes Historical Provider, MD  montelukast (SINGULAIR) 10 MG tablet Take 10 mg by mouth at bedtime.     Yes Historical Provider, MD  Multiple Vitamins-Minerals (MULTIVITAMIN WITH MINERALS) tablet Take 1 tablet by mouth daily.   Yes Historical Provider, MD  NEXIUM 40 MG capsule Take 40 mg by mouth daily. 11/01/12  Yes Historical Provider, MD  omega-3 acid ethyl esters (LOVAZA) 1 g capsule Take 1 g by mouth daily.   Yes Historical  Provider, MD  Polyethyl Glycol-Propyl Glycol (SYSTANE ULTRA OP) Apply 1 drop to eye as needed.   Yes Historical Provider, MD  polyethylene glycol powder (GLYCOLAX/MIRALAX) powder Take 17 g by mouth daily. 03/08/15  Yes Historical Provider, MD  potassium chloride (K-DUR) 10 MEQ tablet Take 10 mEq by mouth daily.   Yes Historical Provider, MD  sodium chloride (OCEAN) 0.65 % SOLN nasal spray Place 1 spray into both nostrils as needed for congestion.   Yes Historical Provider, MD  tiZANidine (ZANAFLEX) 4 MG tablet Take 4 mg by mouth every 8 (eight) hours as needed for muscle spasms.   Yes Historical Provider, MD  traMADol (ULTRAM) 50 MG tablet Take 50-100 mg by mouth every 8 (eight) hours as needed for severe pain.  11/17/12  Yes Historical Provider,  MD  traZODone (DESYREL) 100 MG tablet Take 100 mg by mouth at bedtime.     Yes Historical Provider, MD  ranitidine (ZANTAC) 150 MG capsule Take 150 mg by mouth every evening.    Historical Provider, MD   Allergies  Allergen Reactions  . Other Other (See Comments)    Pt states she took a medication 2-3 yrs ago that may have been for inflammation and it caused her to be weak.    Social History  Substance Use Topics  . Smoking status: Former Smoker -- 2.00 packs/day    Quit date: 04/08/1996  . Smokeless tobacco: Never Used     Comment: ALCOHOL IN PAST  . Alcohol Use: No    Family History  Problem Relation Age of Onset  . Lung cancer Mother   . Emphysema Father   . Heart disease       Review of Systems  Positive ROS: neg  All other systems have been reviewed and were otherwise negative with the exception of those mentioned in the HPI and as above.  Objective: Vital signs in last 24 hours: Temp:  [98.4 F (36.9 C)] 98.4 F (36.9 C) (02/16 0718) Pulse Rate:  [70] 70 (02/16 0718) Resp:  [16] 16 (02/16 0718) BP: (122)/(75) 122/75 mmHg (02/16 0718) SpO2:  [94 %] 94 % (02/16 0718) Weight:  [73.483 kg (162 lb)] 73.483 kg (162 lb) (02/16 0717)  General Appearance: Alert, cooperative, no distress, appears stated age Head: Normocephalic, without obvious abnormality, atraumatic Eyes: PERRL, conjunctiva/corneas clear, EOM's intact    Neck: Supple, symmetrical, trachea midline Back: Symmetric, no curvature, ROM normal, no CVA tenderness Lungs:  respirations unlabored Heart: Regular rate and rhythm Abdomen: Soft, non-tender Extremities: Extremities normal, atraumatic, no cyanosis or edema Pulses: 2+ and symmetric all extremities Skin: Skin color, texture, turgor normal, no rashes or lesions  NEUROLOGIC:   Mental status: Alert and oriented x4,  no aphasia, good attention span, fund of knowledge, and memory Motor Exam - grossly normal Sensory Exam - grossly normal Reflexes:  1+ Coordination - grossly normal Gait - grossly normal Balance - grossly normal Cranial Nerves: I: smell Not tested  II: visual acuity  OS: nl    OD: nl  II: visual fields Full to confrontation  II: pupils Equal, round, reactive to light  III,VII: ptosis None  III,IV,VI: extraocular muscles  Full ROM  V: mastication Normal  V: facial light touch sensation  Normal  V,VII: corneal reflex  Present  VII: facial muscle function - upper  Normal  VII: facial muscle function - lower Normal  VIII: hearing Not tested  IX: soft palate elevation  Normal  IX,X: gag reflex Present  XI: trapezius strength  5/5  XI: sternocleidomastoid strength 5/5  XI: neck flexion strength  5/5  XII: tongue strength  Normal    Data Review Lab Results  Component Value Date   WBC 3.7* 05/02/2015   HGB 12.1 05/02/2015   HCT 38.0 05/02/2015   MCV 86.2 05/02/2015   PLT 253 05/02/2015   Lab Results  Component Value Date   NA 141 05/02/2015   K 3.8 05/02/2015   CL 100* 05/02/2015   CO2 31 05/02/2015   BUN 16 05/02/2015   CREATININE 0.72 05/02/2015   GLUCOSE 85 05/02/2015   Lab Results  Component Value Date   INR 0.95 05/02/2015    Assessment/Plan: Patient admitted for TLIF L3-4. Patient has failed a reasonable attempt at conservative therapy.  I explained the condition and procedure to the patient and answered any questions.  Patient wishes to proceed with procedure as planned. Understands risks/ benefits and typical outcomes of procedure.   Torian Thoennes S 05/10/2015 9:47 AM

## 2015-05-10 NOTE — Progress Notes (Signed)
Pt admitted to 5C20 from PACU.  Pt drowsy but easily arouses and is oriented x 4.  Dressing is CDI with slight puffiness noted.  PACU RN states she has been on her back in PACU.  Turned to side at present.  Pt denies numbess and tingling, and nausea.  Bed alarm set and call bell within reach.  Pt understands to call for assistance before attempting to get out of bed.  Brace at bedside.  Will continue to monitor.

## 2015-05-10 NOTE — Op Note (Signed)
05/10/2015  12:28 PM  PATIENT:  [name]  60 y.o. 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 Decompressive lumbar laminectomy L3-4 requiring more work than would be required for a simple exposure of the disk for TLIF in order to adequately decompress the neural elements and address the spinal stenosis 2. Right transforaminal lumbar interbody fusion L3-4 using the globus expandable cage packed with morcellized allograft and autograft 3. Posterior fixation L3-4 using Depew expedium pedicle screws.  4. Intertransverse arthrodesis L3-4 using morcellized autograft and allograft.  SURGEON:  Sherley Bounds, MD  ASSISTANTS: Dr. Cyndy Freeze  ANESTHESIA:  General  EBL: 200 ml  Total I/O In: 1000 [I.V.:1000] Out: 600 [Urine:400; Blood:200]  BLOOD ADMINISTERED:none  DRAINS: Hemovac   INDICATION FOR PROCEDURE: This patient presented with severe right leg pain. She had undergone a previous L4-5 fusion. Plain films showed a solid fusion. MRI showed severe adjacent level stenosis. She tried medical management without relief. I recommended a decompression and instrumented fusion at L3-4. Patient understood the risks, benefits, and alternatives and potential outcomes and wished to proceed.  PROCEDURE DETAILS:  The patient was brought to the operating room. After induction of generalized endotracheal anesthesia the patient was rolled into the prone position on chest rolls and all pressure points were padded. The patient's lumbar region was cleaned and then prepped with DuraPrep and draped in the usual sterile fashion. Anesthesia was injected and then a dorsal midline incision was made and carried down to the lumbosacral fascia. The fascia was opened and the paraspinous musculature was taken down in a subperiosteal fashion to expose L3-4 as well as the previously placed hardware. I removed the locking caps  from the L4 and L5 pedicle screws. I removed the rods and then I removed the L5 pedicle screws. The L4 pedicle screws had good purchase. I then turned my attention to the decompression and the spinous process was removed and complete lumbar laminectomies, hemi- facetectomies, and foraminotomies were performed at L3-4 right. The patient had significant spinal stenosis and this required more work than would be required for a simple exposure of the disc for  lumbar interbody fusion. Much more generous decompression was undertaken in order to adequately decompress the neural elements and address the patient's leg pain. The yellow ligament was removed to expose the underlying dura and nerve roots, and a generous foraminotomy were performed to adequately decompress the neural elements. Both the exiting and traversing nerve roots were decompressed until a coronary dilator passed easily along the nerve roots. Once the decompression was complete, I turned my attention to the transforaminal lumbar interbody fusion. The epidural venous vasculature was coagulated and cut sharply. Disc space was incised and the initial discectomy was performed with pituitary rongeurs. The disc space was distracted with sequential distractors to a height of 8 mm. We then used a series of scrapers and shavers to prepare the endplates for fusion. The midline was prepared with Epstein curettes. Once the complete discectomy was finished, I packed the anterior part of the disc space with a BMP-soaked sponge and local autograft and allograft , we packed an appropriate sized expandable interbody cage with local autograft and morcellized allograft, gently retracted the nerve root, and tapped the cage into position at L3-4 from the right.  The cage was then gently expanded and watched under fluoroscopy. We then checked AP and lateral fluoroscopy to confirm our placement. The  space appeared to the cage was packed with morselized autograft and allograft. We  then turned our attention to the placement of the upper pedicle screws. The pedicle screw entry zones were identified utilizing surface landmarks and fluoroscopy. I probed each pedicle with a pedicle probe, and tapped each pedicle with the appropriate tap. We palpated with a ball probe to assure no break in the cortex. We then placed 6-0 x 45 mm pedicle screws into the pedicles bilaterally at L4. We then decorticated the transverse processes and laid a mixture of morcellized autograft and allograft out over these to perform intertransverse arthrodesis at L3-4. We then placed lordotic rods into the multiaxial screw heads of the pedicle screws and locked these in position with the locking caps and anti-torque device. We then checked our construct with AP and lateral fluoroscopy. Irrigated with copious amounts of bacitracin-containing saline solution.  Inspected the nerve roots once again to assure adequate decompression, lined to the dura with Gelfoam, and closed the muscle and the fascia with 0 Vicryl. Closed the subcutaneous tissues with 2-0 Vicryl and subcuticular tissues with 3-0 Vicryl. The skin was closed with benzoin and Steri-Strips. Dressing was then applied, the patient was awakened from general anesthesia and transported to the recovery room in stable condition. At the end of the procedure all sponge, needle and instrument counts were correct.   PLAN OF CARE: Admit to inpatient   PATIENT DISPOSITION:  PACU - hemodynamically stable.   Delay start of Pharmacological VTE agent (>24hrs) due to surgical blood loss or risk of bleeding:  yes

## 2015-05-10 NOTE — Transfer of Care (Signed)
Immediate Anesthesia Transfer of Care Note  Patient: [name]  Procedure(s) Performed: Procedure(s) with comments: TRANSFORAMINAL LUMBAR INTERBODY FUSION (TLIF) WITH PEDICLE SCREW FIXATION L3-4 (N/A) - TRANSFORAMINAL LUMBAR INTERBODY FUSION (TLIF) WITH PEDICLE SCREW FIXATION L3-4  Patient Location: PACU  Anesthesia Type:General  Level of Consciousness: awake, alert  and sedated  Airway & Oxygen Therapy: Patient connected to face mask oxygen  Post-op Assessment: Report given to RN  Post vital signs: stable  Last Vitals:  Filed Vitals:   05/10/15 0718  BP: 122/75  Pulse: 70  Temp: 36.9 C  Resp: 16    Complications: No apparent anesthesia complications

## 2015-05-10 NOTE — Progress Notes (Signed)
Utilization review completed.  

## 2015-05-10 NOTE — Anesthesia Procedure Notes (Signed)
Procedure Name: Intubation Date/Time: 05/10/2015 9:57 AM Performed by: Lavell Luster Pre-anesthesia Checklist: Patient identified, Emergency Drugs available, Suction available, Patient being monitored and Timeout performed Patient Re-evaluated:Patient Re-evaluated prior to inductionOxygen Delivery Method: Circle system utilized Preoxygenation: Pre-oxygenation with 100% oxygen Intubation Type: IV induction Ventilation: Mask ventilation without difficulty Laryngoscope Size: Mac and 3 Grade View: Grade I Tube size: 7.0 mm Number of attempts: 1 Airway Equipment and Method: Stylet Placement Confirmation: ETT inserted through vocal cords under direct vision,  positive ETCO2 and breath sounds checked- equal and bilateral Secured at: 21 cm Tube secured with: Tape Dental Injury: Teeth and Oropharynx as per pre-operative assessment

## 2015-05-10 NOTE — Anesthesia Preprocedure Evaluation (Addendum)
Anesthesia Evaluation  Patient identified by MRN, date of birth, ID band Patient awake, Patient confused and Patient unresponsive    Reviewed: Allergy & Precautions, H&P , NPO status , Patient's Chart, lab work & pertinent test results, reviewed documented beta blocker date and time   History of Anesthesia Complications (+) Emergence Delirium and history of anesthetic complications  Airway Mallampati: II   Neck ROM: Full    Dental  (+) Poor Dentition, Upper Dentures   Pulmonary neg pulmonary ROS, former smoker,    breath sounds clear to auscultation       Cardiovascular hypertension, Pt. on medications + Valvular Problems/Murmurs  Rhythm:Regular Rate:Normal     Neuro/Psych  Headaches, Multiple sclerosis  Neuromuscular disease    GI/Hepatic hiatal hernia, GERD  ,  Endo/Other    Renal/GU Renal disease     Musculoskeletal  (+) Arthritis ,   Abdominal (+) + obese,   Peds  Hematology  (+) anemia ,   Anesthesia Other Findings   Reproductive/Obstetrics                          Anesthesia Physical  Anesthesia Plan  ASA: II  Anesthesia Plan: General   Post-op Pain Management:    Induction: Intravenous  Airway Management Planned: Oral ETT  Additional Equipment:   Intra-op Plan:   Post-operative Plan: Extubation in OR  Informed Consent: I have reviewed the patients History and Physical, chart, labs and discussed the procedure including the risks, benefits and alternatives for the proposed anesthesia with the patient or authorized representative who has indicated his/her understanding and acceptance.   Dental advisory given  Plan Discussed with: CRNA and Surgeon  Anesthesia Plan Comments: ("bugs crawling on skin" and what sounds like emergence delirium vs. MS exacerbation with last GA.Marland Kitchen Previously has had successful general anesthetics with no issues. Will avoid known triggers of MS  such as spinal anesthesia and hyperthermia. Will minimize dose of steroid needed intraop for PONV prophylaxis and will preemptively treat with alpha 2 agonist to avoid emergence delirium  Has memory issues and her sister is with her, who is her care giver)       Anesthesia Quick Evaluation

## 2015-05-11 NOTE — Evaluation (Signed)
Occupational Therapy Evaluation Patient Details Name: [name] MRN: [id number] DOB: 1955/10/24 Today's Date: 05/11/2015    History of Present Illness Pt is a 60 y.o. female s/p Decompressive lumbar laminectomy L3-4, Right transforaminal lumbar interbody fusion L3-4, Posterior fixation L3-4, Intertransverse arthrodesis L3-4. PMHx: HTN, Arithritis, MS (multiple sclerosis), Chronic kidney disease, Urinary frequency, Anxiety.    Clinical Impression   Pt reports she was independent with ADLs and used a rollator for mobility PTA. Currently pt is min assist for short distance functional mobility and mod-max assist for ADLs. Pt noted to be lethargic, had difficulty following one step commands and sequencing functional tasks this session. Pt lives alone and reports that she does not have family or friends that are available to assist upon d/c. Due to pts current functional status and decreased caregiver support; recommending SNF with 24/7 supervision as follow up to maximize pts independence and safety with ADLs and functional mobility prior to return home. Pt would benefit from continued skilled OT to address established goals.     Follow Up Recommendations  SNF;Supervision/Assistance - 24 hour    Equipment Recommendations  3 in 1 bedside comode;Other (comment) (adaptive equipment)    Recommendations for Other Services PT consult     Precautions / Restrictions Precautions Precautions: Back;Fall Precaution Booklet Issued: No Precaution Comments: Educated pt on back precautions. Required Braces or Orthoses: Spinal Brace Spinal Brace: Lumbar corset;Applied in sitting position Restrictions Weight Bearing Restrictions: No      Mobility Bed Mobility Overal bed mobility: Needs Assistance Bed Mobility: Rolling;Sidelying to Sit Rolling: Min assist Sidelying to sit: Min assist;HOB elevated       General bed mobility comments: Min assist throughout for managing LEs off EOB and assist  provided at trunk. Verbal and tactile cues throughout for sequencing, hand placement, and safety.  Transfers Overall transfer level: Needs assistance Equipment used: 4-wheeled walker Transfers: Sit to/from Stand Sit to Stand: Min assist         General transfer comment: Min assist to boost up from EOB and for balance once in standing. VCs for hand placement and technique.    Balance Overall balance assessment: Needs assistance Sitting-balance support: No upper extremity supported;Feet supported Sitting balance-Leahy Scale: Poor     Standing balance support: Bilateral upper extremity supported Standing balance-Leahy Scale: Poor Standing balance comment: RW for support                            ADL Overall ADL's : Needs assistance/impaired Eating/Feeding: Set up;Sitting   Grooming: Set up;Sitting   Upper Body Bathing: Moderate assistance;Sitting   Lower Body Bathing: Moderate assistance;Sit to/from stand   Upper Body Dressing : Maximal assistance;Sitting Upper Body Dressing Details (indicate cue type and reason): to don back brace sitting EOB. Lower Body Dressing: Maximal assistance;Sit to/from stand Lower Body Dressing Details (indicate cue type and reason): Pt unable to cross leg over opposite knee. Toilet Transfer: Minimal assistance;Cueing for safety;Cueing for sequencing;Stand-pivot;BSC;RW Toilet Transfer Details (indicate cue type and reason): Simulated by transfer from EOB to chair. Pt required cues for sequencing and safety; pt with difficulty following one step commands to turn toward chair.  Toileting- Clothing Manipulation and Hygiene: Total assistance;Sit to/from stand       Functional mobility during ADLs: Minimal assistance;Rolling walker;Cueing for safety;Cueing for sequencing General ADL Comments: No family present for OT eval. Educated on use of brace when OOB, maintaining precautions during functional activities. Pt inconsistently following one  step commands; seems to have difficulty understanding what you want her to do. Discussed potential for post acute rehab upon d/c, pt reports she doesnt really want to go somewhere and would rather have someone come stay with her out at her house.     Vision     Perception     Praxis      Pertinent Vitals/Pain Pain Assessment: 0-10 Pain Score: 8  Pain Location: back Pain Descriptors / Indicators: Aching;Grimacing;Operative site guarding Pain Intervention(s): Limited activity within patient's tolerance;Monitored during session;Repositioned;Patient requesting pain meds-RN notified     Hand Dominance Right   Extremity/Trunk Assessment Upper Extremity Assessment Upper Extremity Assessment: Generalized weakness   Lower Extremity Assessment Lower Extremity Assessment: Defer to PT evaluation   Cervical / Trunk Assessment Cervical / Trunk Assessment: Other exceptions Cervical / Trunk Exceptions: pt s/p lumbar sx   Communication Communication Communication: No difficulties   Cognition Arousal/Alertness: Lethargic Behavior During Therapy: Flat affect Overall Cognitive Status: No family/caregiver present to determine baseline cognitive functioning                     General Comments       Exercises       Shoulder Instructions      Home Living Family/patient expects to be discharged to:: Private residence Living Arrangements: Alone Available Help at Discharge: Other (Comment) (pt reports no help available at d/c) Type of Home: Apartment Home Access: Level entry     Home Layout: One level     Bathroom Shower/Tub: Occupational psychologist: Standard Bathroom Accessibility: Yes How Accessible: Accessible via walker Home Equipment: Walker - 4 wheels;Grab bars - tub/shower;Shower seat          Prior Functioning/Environment Level of Independence: Independent with assistive device(s)        Comments: rollator for mobility    OT Diagnosis:  Generalized weakness;Acute pain;Altered mental status   OT Problem List: Decreased strength;Decreased activity tolerance;Impaired balance (sitting and/or standing);Decreased safety awareness;Decreased knowledge of use of DME or AE;Decreased knowledge of precautions;Pain   OT Treatment/Interventions: Self-care/ADL training;Energy conservation;DME and/or AE instruction;Therapeutic activities;Patient/family education;Balance training    OT Goals(Current goals can be found in the care plan section) Acute Rehab OT Goals Patient Stated Goal: return to independence OT Goal Formulation: With patient Time For Goal Achievement: 05/25/15 Potential to Achieve Goals: Good ADL Goals Pt Will Perform Grooming: with supervision;standing Pt Will Perform Lower Body Bathing: with supervision;with adaptive equipment;sit to/from stand Pt Will Perform Lower Body Dressing: with supervision;with adaptive equipment;sit to/from stand Pt Will Transfer to Toilet: with supervision;ambulating;bedside commode (over toilet) Pt Will Perform Toileting - Clothing Manipulation and hygiene: with supervision;with adaptive equipment;sit to/from stand Additional ADL Goal #1: Pt will independently don/doff back brace as precursor to ADL activities. Additional ADL Goal #2: Pt will independently maintain back precautions throughout ADL activity.  OT Frequency: Min 2X/week   Barriers to D/C: Decreased caregiver support  Pt reports she does not have any family/friends that can provide supervision upon return home       Co-evaluation              End of Session Equipment Utilized During Treatment: Gait belt;Rolling walker;Back brace Nurse Communication: Mobility status;Patient requests pain meds  Activity Tolerance: Patient limited by lethargy Patient left: in chair;with call bell/phone within reach;with chair alarm set   Time: [id number] OT Time Calculation (min): 25 min Charges:  OT General Charges $OT Visit: 1  Procedure OT Evaluation $OT Eval  Moderate Complexity: 1 Procedure OT Treatments $Self Care/Home Management : 8-22 mins G-Codes:     Binnie Kand M.S., OTR/L Pager: [phone]  05/11/2015, 8:26 AM

## 2015-05-11 NOTE — Progress Notes (Signed)
Hourly rounding performed. Call light within reach. Pt in no acute distress. Denies needs. Given new icepack

## 2015-05-11 NOTE — Progress Notes (Signed)
Pt transferred with rn assist, brace, and walker from chair to bed. Pt positioned and ice pack applied. Hourly rounding performed. Call light within reach. Pt in no acute distress. Denies needs.

## 2015-05-11 NOTE — Progress Notes (Signed)
Patient ID: [name], female   DOB: 15-May-1955, 60 y.o.   MRN: [id number] Subjective: Patient reports she's doing well, back sore, no leg pain or NTW  Objective: Vital signs in last 24 hours: Temp:  [97.2 F (36.2 C)-99.1 F (37.3 C)] 99 F (37.2 C) (02/17 0523) Pulse Rate:  [60-107] 91 (02/17 0523) Resp:  [10-20] 20 (02/17 0523) BP: (99-144)/(62-89) 134/79 mmHg (02/17 0523) SpO2:  [91 %-100 %] 100 % (02/17 0523)  Intake/Output from previous day: 02/16 0701 - 02/17 0700 In: 1790 [P.O.:340; I.V.:1450] Out: 1600 [Urine:1400; Blood:200] Intake/Output this shift:    Neurologic: Grossly normal, dressing dry, oob to chair  Lab Results: Lab Results  Component Value Date   WBC 3.7* 05/02/2015   HGB 12.1 05/02/2015   HCT 38.0 05/02/2015   MCV 86.2 05/02/2015   PLT 253 05/02/2015   Lab Results  Component Value Date   INR 0.95 05/02/2015   BMET Lab Results  Component Value Date   NA 141 05/02/2015   K 3.8 05/02/2015   CL 100* 05/02/2015   CO2 31 05/02/2015   GLUCOSE 85 05/02/2015   BUN 16 05/02/2015   CREATININE 0.72 05/02/2015   CALCIUM 10.2 05/02/2015    Studies/Results: Dg Lumbar Spine 2-3 Views  05/10/2015  CLINICAL DATA:  TRANSFORAMINAL LUMBAR INTERBODY FUSION (TLIF) WITH PEDICLE SCREW FIXATION L3-4 RSTO: TMJ FLUORO TIME: 32 SECS EXAM: LUMBAR SPINE - 2-3 VIEW; DG C-ARM 61-120 MIN COMPARISON:  04/17/2015 FINDINGS: Fluoroscopy Time:  0 minutes 32 seconds Number of Acquired Images:  2 Transpedicular screws at L3 and L4 with interbody fusion device present. IMPRESSION: Anticipated postoperative change Electronically Signed   By: Skipper Cliche M.D.   On: 05/10/2015 12:05   Dg C-arm 61-120 Min  05/10/2015  CLINICAL DATA:  TRANSFORAMINAL LUMBAR INTERBODY FUSION (TLIF) WITH PEDICLE SCREW FIXATION L3-4 RSTO: TMJ FLUORO TIME: 32 SECS EXAM: LUMBAR SPINE - 2-3 VIEW; DG C-ARM 61-120 MIN COMPARISON:  04/17/2015 FINDINGS: Fluoroscopy Time:  0 minutes 32 seconds Number of Acquired  Images:  2 Transpedicular screws at L3 and L4 with interbody fusion device present. IMPRESSION: Anticipated postoperative change Electronically Signed   By: Skipper Cliche M.D.   On: 05/10/2015 12:05    Assessment/Plan: Doing well, PT/OT, her plan is home on Sunday with [facility] and that appears to be a reasonable goal   LOS: 1 day    Marisol Giambra S 05/11/2015, 10:13 AM

## 2015-05-11 NOTE — Evaluation (Signed)
Physical Therapy Evaluation Patient Details Name: [name] MRN: [id number] DOB: 04-06-1955 Today's Date: 05/11/2015   History of Present Illness  Pt is a 60 y.o. female s/p Decompressive lumbar laminectomy L3-4, Right transforaminal lumbar interbody fusion L3-4, Posterior fixation L3-4, Intertransverse arthrodesis L3-4. PMHx: HTN, Arithritis, MS (multiple sclerosis), Chronic kidney disease, Urinary frequency, Anxiety.     Clinical Impression  Patient evaluated by Physical Therapy with no further acute PT needs identified. All education has been completed and the patient has no further questions.  PT is signing off. Thank you for this referral.  **Noted OT saw incr cognitive issues this a.m. Question if due to pain medicine? Patient admits to decr memory due to Multiple Sclerosis.     Follow Up Recommendations No PT follow up    Equipment Recommendations  None recommended by PT    Recommendations for Other Services       Precautions / Restrictions Precautions Precautions: Back;Fall Precaution Booklet Issued: Yes (comment) Precaution Comments: Educated pt on back precautions. Did not recall from mornign session, however did adhere Required Braces or Orthoses: Spinal Brace Spinal Brace: Lumbar corset;Applied in sitting position Restrictions Weight Bearing Restrictions: No      Mobility  Bed Mobility                  Transfers Overall transfer level: Needs assistance Equipment used: 4-wheeled walker Transfers: Sit to/from Stand Sit to Stand: Supervision         General transfer comment: supervision/cues for stand to sit only (to lock rollator and reach back for furniture)  Ambulation/Gait Ambulation/Gait assistance: Min guard;Modified independent (Device/Increase time) Ambulation Distance (Feet): 200 Feet Assistive device: 4-wheeled walker Gait Pattern/deviations: Step-through pattern;Decreased stride length     General Gait Details: excellent upright  posture  Stairs            Wheelchair Mobility    Modified Rankin (Stroke Patients Only)       Balance   Sitting-balance support: No upper extremity supported;Feet supported Sitting balance-Leahy Scale: Fair     Standing balance support: No upper extremity supported Standing balance-Leahy Scale: Fair Standing balance comment: standing with RN (after dressing) without UE support upon entry                             Pertinent Vitals/Pain Pain Assessment: 0-10 Pain Score: 2  Pain Location: back Pain Descriptors / Indicators: Operative site guarding Pain Intervention(s): Limited activity within patient's tolerance;Monitored during session;Repositioned    Home Living Family/patient expects to be discharged to:: Private residence Living Arrangements: Alone Available Help at Discharge: Family;Friend(s);Available PRN/intermittently (arranged for a friend to come in the evening; sister also ) Type of Home: Apartment Home Access: Level entry     Home Layout: One level Home Equipment: Walker - 4 wheels;Grab bars - tub/shower;Shower seat      Prior Function Level of Independence: Independent with assistive device(s)         Comments: rollator for mobility due to MS     Hand Dominance   Dominant Hand: Right    Extremity/Trunk Assessment   Upper Extremity Assessment: Overall WFL for tasks assessed           Lower Extremity Assessment: Overall WFL for tasks assessed      Cervical / Trunk Assessment: Other exceptions  Communication   Communication: No difficulties  Cognition Arousal/Alertness: Awake/alert Behavior During Therapy: Flat affect Overall Cognitive Status: History of cognitive impairments -  at baseline (Pt reports memory issues due to MS & was "foggy" from meds )                      General Comments      Exercises        Assessment/Plan    PT Assessment Patent does not need any further PT services  PT  Diagnosis Acute pain   PT Problem List    PT Treatment Interventions     PT Goals (Current goals can be found in the Care Plan section) Acute Rehab PT Goals Patient Stated Goal: return to independence PT Goal Formulation: All assessment and education complete, DC therapy    Frequency     Barriers to discharge        Co-evaluation               End of Session Equipment Utilized During Treatment: Back brace Activity Tolerance: Patient tolerated treatment well Patient left: in chair;with call bell/phone within reach;with chair alarm set Nurse Communication: Mobility status (no PT needs)         Time: [id number] PT Time Calculation (min) (ACUTE ONLY): 20 min   Charges:   PT Evaluation $PT Eval Low Complexity: 1 Procedure     PT G Codes:        Ida Milbrath 06-09-15, 1:37 PM  Pager [phone]

## 2015-05-11 NOTE — Care Management Note (Signed)
Case Management Note  Patient Details  Name: [name] MRN: [id number] Date of Birth: 1955/10/19  Subjective/Objective:                    Action/Plan: Plan when patient medically ready for discharge is home with [facility] services. CM met with the patient and her sister and provided them a list of [facility] agencies in the [place] area. They selected [facility]. Drew with [facility] notified and accepted the referral. Pt ordered a walker and 3 in 1. Pt states she already has a walker but wants 3 in 1. Jermaine with [facility] notified and will deliver the DME to the room. Will update bedside RN.   Expected Discharge Date:                  Expected Discharge Plan:  [place]  In-House Referral:     Discharge planning Services  CM Consult  Post Acute Care Choice:  Durable Medical Equipment, Home Health Choice offered to:  Patient, Sibling  DME Arranged:  3-N-1 DME Agency:  [place]:  PT, OT, Nurse's Aide [place] Agency:  [place]  Status of Service:  In process, will continue to follow  Medicare Important Message Given:    Date Medicare IM Given:    Medicare IM give by:    Date Additional Medicare IM Given:    Additional Medicare Important Message give by:     If discussed at [place] of Stay Meetings, dates discussed:    Additional Comments:  Pollie Friar, RN 05/11/2015, 12:05 PM

## 2015-05-11 NOTE — Care Management Note (Signed)
Case Management Note  Patient Details  Name: Angela HUNSAKER MRN: [id number] Date of Birth: 1955-10-01  Subjective/Objective:                    Action/Plan: Patient was admitted for a TLIF. Lives at home alone. Will follow for discharge needs pending PT/OT evals and physician orders.  Expected Discharge Date:                  Expected Discharge Plan:     In-House Referral:     Discharge planning Services     Post Acute Care Choice:    Choice offered to:     DME Arranged:    DME Agency:     HH Arranged:    HH Agency:     Status of Service:  In process, will continue to follow  Medicare Important Message Given:    Date Medicare IM Given:    Medicare IM give by:    Date Additional Medicare IM Given:    Additional Medicare Important Message give by:     If discussed at [place] of Stay Meetings, dates discussed:    Additional Comments:  Rolm Baptise, RN 05/11/2015, 11:27 AM [phone]

## 2015-05-11 NOTE — Progress Notes (Signed)
Pt given ginger ale to drink and icepack. Pt sitting in recliner. Call light within reach. Pt assisted to bathroom with brace and walker. +1 assist.

## 2015-05-12 MED ORDER — IBUPROFEN 200 MG PO TABS
600.0000 mg | ORAL_TABLET | Freq: Four times a day (QID) | ORAL | Status: DC | PRN
  Administered 2015-05-12 – 2015-05-13 (×3): 600 mg via ORAL
  Filled 2015-05-12 (×3): qty 3

## 2015-05-12 MED ORDER — INTERFERON BETA-1B 0.3 MG ~~LOC~~ KIT
0.2500 mg | PACK | SUBCUTANEOUS | Status: DC
  Administered 2015-05-12: 0.25 mg via SUBCUTANEOUS
  Filled 2015-05-12 (×3): qty 0.3

## 2015-05-12 NOTE — Progress Notes (Addendum)
Occupational Therapy Treatment Patient Details Name: [name] MRN: [id number] DOB: 04-Apr-1955 Today's Date: 05/12/2015    History of present illness Pt is a 60 y.o. female s/p Decompressive lumbar laminectomy L3-4, Right transforaminal lumbar interbody fusion L3-4, Posterior fixation L3-4, Intertransverse arthrodesis L3-4. PMHx: HTN, Arithritis, MS (multiple sclerosis), Chronic kidney disease, Urinary frequency, Anxiety.    OT comments  Pt. With noted improvement from yesterday's documentation.  Able to complete all acute OT goals at S level.  Clear for d/c from OT standpoint.  Pt. Aware and agrees.  No further questions/concerns.  Will notify OTR/L for sign off.    Follow Up Recommendations  SNF;Supervision/Assistance - 24 hour    Equipment Recommendations  3 in 1 bedside comode;Other (comment)    Recommendations for Other Services      Precautions / Restrictions Precautions Precautions: Back;Fall Precaution Comments: could not recall but when demonstrated could state them, and did not break them during therapy session Required Braces or Orthoses: Spinal Brace Spinal Brace: Lumbar corset;Applied in sitting position       Mobility Bed Mobility               General bed mobility comments: pt. in recliner upon arrival into room  Transfers Overall transfer level: Needs assistance Equipment used: 4-wheeled walker Transfers: Sit to/from [facility] Sit to Stand: Supervision Stand pivot transfers: Supervision            Balance                                   ADL Overall ADL's : Needs assistance/impaired     Grooming: Standing;Supervision/safety Grooming Details (indicate cue type and reason): simulated in b.room and sink during session       Lower Body Bathing Details (indicate cue type and reason): S seated able to cross L/R leg over knees       Lower Body Dressing Details (indicate cue type and reason): S seated able to  cross L/R leg over knees Toilet Transfer: Supervision/safety;Ambulation;Regular Toilet (pt. used Air cabin crew for ambulation)   Toileting- Water quality scientist and Hygiene: Sit to/from stand;Sitting/lateral lean;Supervision/safety Toileting - Clothing Manipulation Details (indicate cue type and reason): simulated as pt. did not actually have to use the b.room     Functional mobility during ADLs: Supervision/safety General ADL Comments: noted improvement from yesterdays documentation.  pt. completed all ADL tasks at S level.      Vision                     Perception     Praxis      Cognition   Behavior During Therapy: WFL for tasks assessed/performed Overall Cognitive Status: Within Functional Limits for tasks assessed                       Extremity/Trunk Assessment               Exercises     Shoulder Instructions       General Comments      Pertinent Vitals/ Pain       Pain Assessment:  (did not rate but states "im feeling pain now") Pain Location: back Pain Descriptors / Indicators: Aching Pain Intervention(s): Monitored during session;Repositioned;Patient requesting pain meds-RN notified  Home Living  Prior Functioning/Environment              Frequency Min 2X/week     Progress Toward Goals  OT Goals(current goals can now be found in the care plan section)  Progress towards OT goals: Goals met/education completed, patient discharged from [facility] Discharge plan remains appropriate    Co-evaluation                 End of Session Equipment Utilized During Treatment: Rolling walker;Back brace   Activity Tolerance Patient tolerated treatment well   Patient Left in chair;with call bell/phone within reach   Nurse Communication          Time: 0720-0734 OT Time Calculation (min): 14 min  Charges: OT General Charges $OT Visit: 1 Procedure OT  Treatments $Self Care/Home Management : 8-22 mins  Janice Coffin, COTA/L 05/12/2015, 7:43 AM

## 2015-05-12 NOTE — Progress Notes (Signed)
Patient is encouraged to wear her back brace as she has refused a few times while ambulating to the bathroom.

## 2015-05-12 NOTE — Progress Notes (Signed)
Patient ID: [name], female   DOB: 11-23-1955, 60 y.o.   MRN: [id number] Feeling better, no weakness. Questioning about her antibiotics.

## 2015-05-13 MED ORDER — SENNA 8.6 MG PO TABS
1.0000 | ORAL_TABLET | Freq: Two times a day (BID) | ORAL | Status: DC
  Administered 2015-05-13: 8.6 mg via ORAL
  Filled 2015-05-13: qty 1

## 2015-05-13 NOTE — Progress Notes (Signed)
DISCHARGED HOME WITH FAMILY=> Reviewed d/c instructions with patient and sister. Staff assisted patient to vehicle with a wheelchair.

## 2015-05-13 NOTE — Discharge Summary (Signed)
Physician Discharge Summary  Patient ID: [name] MRN: [id number] DOB/AGE: July 11, 1955 60 y.o.  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 She tolerated surgery well.  Consults: None  Significant Diagnostic Studies: None  Treatments: surgery: Decompression L3-L4 with arthrodesis L3 to the lumbar spine.  Discharge Exam: Blood pressure 110/67, pulse 87, temperature 98.4 F (36.9 C), temperature source Oral, resp. rate 18, height 5' 2" (1.575 m), weight 73.483 kg (162 lb), SpO2 94 %. Dressing is clean dry on patient's back motor function is intact in both lower extremities area patient is ambulatory. Station is intact  Disposition: 01-Home or Self Care  Discharge Instructions    Call MD for:  redness, tenderness, or signs of infection (pain, swelling, redness, odor or green/yellow discharge around incision site)    Complete by:  As directed      Call MD for:  severe uncontrolled pain    Complete by:  As directed      Call MD for:  temperature >100.4    Complete by:  As directed      Diet - low sodium heart healthy    Complete by:  As directed      Increase activity slowly    Complete by:  As directed             Medication List    TAKE these medications        amLODipine 10 MG tablet  Commonly known as:  NORVASC  Take 10 mg by mouth daily.     atorvastatin 20 MG tablet  Commonly known as:  LIPITOR  Take 20 mg by mouth daily.     baclofen 20 MG tablet  Commonly known as:  LIORESAL  Take 20 mg by mouth 3 (three) times daily. 1 tab  twice daily, and 2 tablets before bedtime     CALCIUM + D PO  Take 1 capsule by mouth daily.     DULoxetine 60 MG capsule  Commonly known as:  CYMBALTA  Take 1 capsule by mouth daily.     ENSURE  Take 237 mLs by mouth 2 (two) times daily as needed.     fluticasone 50 MCG/ACT nasal spray  Commonly known as:  FLONASE  Place 2 sprays into the nose 2 (two) times daily as needed. For stuffy nose.     gabapentin 100 MG capsule  Commonly known as:  NEURONTIN  Take 2 capsules (200 mg total) by mouth 3 (three) times daily.     ibuprofen 600 MG tablet  Commonly known as:  ADVIL,MOTRIN  Take 1 tablet by mouth every 8 (eight) hours as needed for mild pain or moderate pain.     Interferon Beta-1b 0.3 MG Kit injection  Commonly known as:  BETASERON  RECONSTITUTE WITH 1.2 ML DILUENT AND INJECT 1 ML OR 0.25MG SUBCUTANEOUSLY EVERY OTHER DAY     lisinopril-hydrochlorothiazide 20-25 MG tablet  Commonly known as:  PRINZIDE,ZESTORETIC  Take 1 tablet by mouth daily.     montelukast 10 MG tablet  Commonly known as:  SINGULAIR  Take 10 mg by mouth at bedtime.  multivitamin with minerals tablet  Take 1 tablet by mouth daily.     NEXIUM 40 MG capsule  Generic drug:  esomeprazole  Take 40 mg by mouth daily.     omega-3 acid ethyl esters 1 g capsule  Commonly known as:  LOVAZA  Take 1 g by mouth daily.     polyethylene glycol powder powder  Commonly known as:  GLYCOLAX/MIRALAX  Take 17 g by mouth daily.     potassium chloride 10 MEQ tablet  Commonly known as:  K-DUR  Take 10 mEq by mouth daily.     ranitidine 150 MG capsule  Commonly known as:  ZANTAC  Take 150 mg by mouth every evening.     sodium chloride 0.65 % Soln nasal spray  Commonly known as:  OCEAN  Place 1 spray into both nostrils as needed for congestion.     SYSTANE ULTRA OP  Apply 1 drop to eye as needed.     tiZANidine 4 MG tablet  Commonly known as:  ZANAFLEX  Take 4 mg by mouth every 8 (eight) hours as  needed for muscle spasms.     traMADol 50 MG tablet  Commonly known as:  ULTRAM  Take 50-100 mg by mouth every 8 (eight) hours as needed for severe pain.     traZODone 100 MG tablet  Commonly known as:  DESYREL  Take 100 mg by mouth at bedtime.         SignedEarleen Newport 05/13/2015, 10:16 AM

## 2015-05-14 ENCOUNTER — Encounter (HOSPITAL_COMMUNITY): Payer: Self-pay | Admitting: Neurological Surgery

## 2015-05-17 ENCOUNTER — Ambulatory Visit: Payer: Medicare Other | Admitting: Family Medicine

## 2015-05-17 NOTE — Anesthesia Postprocedure Evaluation (Signed)
Anesthesia Post Note  Patient: [name]  Procedure(s) Performed: Procedure(s) (LRB): TRANSFORAMINAL LUMBAR INTERBODY FUSION (TLIF) WITH PEDICLE SCREW FIXATION L3-4 (N/A)  Patient location during evaluation: PACU Anesthesia Type: General Level of consciousness: awake and alert Pain management: pain level controlled Vital Signs Assessment: post-procedure vital signs reviewed and stable Respiratory status: spontaneous breathing, nonlabored ventilation, respiratory function stable and patient connected to nasal cannula oxygen Cardiovascular status: blood pressure returned to baseline and stable Postop Assessment: no signs of nausea or vomiting Anesthetic complications: no                 Zenaida Deed

## 2015-08-08 ENCOUNTER — Ambulatory Visit: Payer: Medicare Other | Admitting: Diagnostic Neuroimaging

## 2015-08-29 ENCOUNTER — Other Ambulatory Visit: Payer: Self-pay | Admitting: Obstetrics and Gynecology

## 2015-08-29 ENCOUNTER — Other Ambulatory Visit: Payer: Self-pay | Admitting: Internal Medicine

## 2015-08-29 ENCOUNTER — Other Ambulatory Visit (HOSPITAL_COMMUNITY)
Admission: RE | Admit: 2015-08-29 | Discharge: 2015-08-29 | Disposition: A | Discharge status: Home / Self Care | Payer: Medicare Other | Source: Ambulatory Visit | Attending: Obstetrics and Gynecology | Admitting: Obstetrics and Gynecology

## 2015-08-29 DIAGNOSIS — Z01419 Encounter for gynecological examination (general) (routine) without abnormal findings: Secondary | ICD-10-CM | POA: Diagnosis present

## 2015-08-29 DIAGNOSIS — Z1151 Encounter for screening for human papillomavirus (HPV): Secondary | ICD-10-CM | POA: Diagnosis present

## 2015-08-29 DIAGNOSIS — N644 Mastodynia: Secondary | ICD-10-CM

## 2015-08-30 LAB — CYTOLOGY - PAP

## 2015-09-03 ENCOUNTER — Ambulatory Visit: Payer: Medicare Other | Admitting: Diagnostic Neuroimaging

## 2015-09-04 ENCOUNTER — Ambulatory Visit
Admission: RE | Admit: 2015-09-04 | Discharge: 2015-09-04 | Disposition: A | Discharge status: Home / Self Care | Payer: Medicare Other | Source: Ambulatory Visit | Attending: Obstetrics and Gynecology | Admitting: Obstetrics and Gynecology

## 2015-09-04 DIAGNOSIS — N644 Mastodynia: Secondary | ICD-10-CM

## 2015-10-11 ENCOUNTER — Telehealth: Payer: Self-pay | Admitting: Diagnostic Neuroimaging

## 2015-10-11 NOTE — Telephone Encounter (Signed)
[place] with me if ok with Dr. Leta Baptist

## 2015-10-11 NOTE — Telephone Encounter (Signed)
Patient is calling to change providers. [name] wants to change from Dr. Leta Baptist to Dr. Felecia Shelling. Please call patient and advise.

## 2015-10-11 NOTE — Telephone Encounter (Deleted)
The patient is calling because she would like to change providers. She would like to change from Dr. Leta Baptist to Dr. Felecia Shelling. She does have an appointment scheduled with Dr. Leta Baptist on 10-30-15. Please call the patient and advise.

## 2015-10-18 NOTE — Telephone Encounter (Signed)
Ok to switch from my standpoint. -VRP 

## 2015-10-30 ENCOUNTER — Ambulatory Visit: Payer: Medicare Other | Admitting: Diagnostic Neuroimaging

## 2015-11-06 ENCOUNTER — Ambulatory Visit: Payer: Medicare Other | Admitting: Neurology

## 2015-11-23 ENCOUNTER — Encounter: Payer: Self-pay | Admitting: Neurology

## 2015-11-23 ENCOUNTER — Ambulatory Visit (INDEPENDENT_AMBULATORY_CARE_PROVIDER_SITE_OTHER): Payer: Medicare Other | Admitting: Neurology

## 2015-11-23 VITALS — BP 118/68 | Resp 16 | Ht 62.0 in | Wt 169.5 lb

## 2015-11-23 DIAGNOSIS — R5383 Other fatigue: Secondary | ICD-10-CM | POA: Diagnosis not present

## 2015-11-23 DIAGNOSIS — R413 Other amnesia: Secondary | ICD-10-CM | POA: Diagnosis not present

## 2015-11-23 DIAGNOSIS — G35 Multiple sclerosis: Secondary | ICD-10-CM | POA: Diagnosis not present

## 2015-11-23 DIAGNOSIS — R269 Unspecified abnormalities of gait and mobility: Secondary | ICD-10-CM | POA: Diagnosis not present

## 2015-11-23 DIAGNOSIS — R0683 Snoring: Secondary | ICD-10-CM

## 2015-11-23 DIAGNOSIS — G4719 Other hypersomnia: Secondary | ICD-10-CM

## 2015-11-23 MED ORDER — GABAPENTIN 300 MG PO CAPS: ORAL_CAPSULE | ORAL | 5 refills | Status: DC

## 2015-11-23 NOTE — Progress Notes (Signed)
[facility]  PATIENT: [name] DOB: [birth year]  REFERRING DOCTOR OR PCP:  Dr. Leta Baptist  SOURCE: patient, MRI images on PACS, imaging reports, notes in EPIC and labs in EPIC  _________________________________   HISTORICAL  CHIEF COMPLAINT:  Chief Complaint  Patient presents with  . Multiple Sclerosis    [name] is here to transition care of MS from Dr. Leta Baptist to Dr. Felecia Shelling.  Sts. she was dx. around 34.  Presenting sx. was gait disturbance.  Originally treated by Dr. Erling Cruz here at [facility] and was started on Betaseron, which she remains on today.  Sts. she tolerates it pretty well.  Sts. she feels gait/balance, fatigue continue to gradually worsen.  She is ambulatory with a cane today.  She would like to discuss other tx. options.  She has a rash on bilat legs, not sure if it is related to Betaseron/fim  . 25 ft. walk    24.5 (average 2)/fim    HISTORY OF PRESENT ILLNESS:  [name] is a 60 year old woman with relapsing remitting multiple sclerosis.    She is currently on Betaseron. She tolerates the injections well. Asked MRI did not show any new changes.  MS History:    In 1983, while at work, she was having difficulty with her gait and was unsteady and falling. She R doctor and was referred to Dr. Morrell Riddle of [facility] neurology who diagnosed her with multiple sclerosis. She was in the Betaseron drug study (was on placebo) with Dr. Terie Purser in [place]. When Betaseron became available in 1992 or 1993 she started the medication and was seen Dr. Erling Cruz. Over the next few years she had a couple exacerbations that involved leg function, gait or balance. She had some treatments with IV steroids. She also has had some slow worsening with her gait more recently. Her last MRI of the brain and MRI of the cervical spine was performed in 2015. There were no acute lesions but there was a fair plaque burden in the brain and several foci in the cervical  spine.  Gait/strength/sensation:    She has difficulty with her gait, due to balance more than to weakness. She uses a cane. Although she is unsteady she gets by pretty well with a cane. She can get in and out of a car easily and can move around her house without too much trouble at this point. She notes some muscle spasticity in her legs, a little worse on the left. There is mild leg weakness. However, she notes more clumsiness with the legs, especially on the left. She has some tingling uncomfortable sensations in her legs and is currently on gabapentin 200 mg 3 times a day. She does not note any significant numbness weakness or clumsiness in the arms.  Bladder/bowel: She has a lot of of urinary frequency and urgency. She has 1 x nocturia.    She has constipation.  Vision:   She was told that she has had optic neuritis. She is not certain which I. She did have an episode of change in colors in the vision but this lasted less than one day. He wears glasses and needs to get a new prescription for the lenses.  Fatigue/sleep: She reports some fatigue that is mostly physical. She notes it more when she is on her feet for longer period of time. He has difficulty with sleep maintenance insomnia more than with sleep onset. She snores and has rarely woken up gasping for air.  Mood/cognition: She denies any depression  or anxiety.  She notes minimal cognitive difficulty. Specifically, she will sometimes have trouble coming up with the right words she feels the cognitive function is stable.  EPWORTH SLEEPINESS SCALE  On a scale of 0 - 3 what is the chance of dozing:  Sitting and Reading:   3 Watching TV:    3 Sitting inactive in a public place: 2 Passenger in car for one hour: 0 Lying down to rest in the afternoon: 3 Sitting and talking to someone: 0 Sitting quietly after lunch:  3 In a car, stopped in traffic:  0  Total (out of 24):    14/24   MRI Review:   I personally reviewed the MRI of the  brain and cervical spine from 07/25/2013. In the brain, there are extensive T2/FLAIR hyperintense foci involving the cerebellum, left posterior medulla, there was a normal enhancement pattern. The cervical spine showed hyperintense foci adjacent to C3, C5-C6, C6, and T1. Right posterior pons, bilateral hemispheres, predominantly in the periventricular cortical white matter.   None of these enhanced.   REVIEW OF SYSTEMS: Constitutional: No fevers, chills, sweats, or change in appetite.  Fatigue and sleepiness.  She has maintenance insomnia Eyes: No visual changes, double vision, eye pain Ear, nose and throat: No hearing loss, ear pain, nasal congestion, sore throat Cardiovascular: No chest pain, palpitations Respiratory: No shortness of breath at rest or with exertion.   No wheezes.   She snores GastrointestinaI: No nausea, vomiting, diarrhea, abdominal pain, fecal incontinence Genitourinary: as above Musculoskeletal: No neck pain, back pain Integumentary: No rash, pruritus, skin lesions Neurological: as above Psychiatric: No depression at this time.  No anxiety Endocrine: No palpitations, diaphoresis, change in appetite, change in weigh or increased thirst Hematologic/Lymphatic: No anemia, purpura, petechiae. Allergic/Immunologic: No itchy/runny eyes, nasal congestion, recent allergic reactions, rashes  ALLERGIES: Allergies  Allergen Reactions  . Other Other (See Comments)    Pt states she took a medication 2-3 yrs ago that may have been for inflammation and it caused her to be weak.    HOME MEDICATIONS:  Current Outpatient Prescriptions:  .  amLODipine (NORVASC) 10 MG tablet, Take 10 mg by mouth daily.  , Disp: , Rfl:  .  atorvastatin (LIPITOR) 20 MG tablet, Take 20 mg by mouth daily.  , Disp: , Rfl:  .  baclofen (LIORESAL) 20 MG tablet, Take 20 mg by mouth 3 (three) times daily. 1 tab twice daily, and 2 tablets before bedtime, Disp: , Rfl:  .  Calcium Citrate-Vitamin D (CALCIUM  + D PO), Take 1 capsule by mouth daily., Disp: , Rfl:  .  DULoxetine (CYMBALTA) 60 MG capsule, Take 1 capsule by mouth daily., Disp: , Rfl:  .  ENSURE (ENSURE), Take 237 mLs by mouth 2 (two) times daily as needed., Disp: , Rfl:  .  fluticasone (FLONASE) 50 MCG/ACT nasal spray, Place 2 sprays into the nose 2 (two) times daily as needed. For stuffy nose. , Disp: , Rfl:  .  gabapentin (NEURONTIN) 100 MG capsule, Take 2 capsules (200 mg total) by mouth 3 (three) times daily., Disp: 180 capsule, Rfl: 12 .  ibuprofen (ADVIL,MOTRIN) 600 MG tablet, Take 1 tablet by mouth every 8 (eight) hours as needed for mild pain or moderate pain. , Disp: , Rfl:  .  Interferon Beta-1b (BETASERON) 0.3 MG KIT injection, RECONSTITUTE WITH 1.2 ML DILUENT AND INJECT 1 ML OR 0.25MG SUBCUTANEOUSLY EVERY OTHER DAY, Disp: 14 each, Rfl: PRN .  lisinopril-hydrochlorothiazide (PRINZIDE,ZESTORETIC) 20-25 MG per tablet,  Take 1 tablet by mouth daily.  , Disp: , Rfl:  .  montelukast (SINGULAIR) 10 MG tablet, Take 10 mg by mouth at bedtime.  , Disp: , Rfl:  .  Multiple Vitamins-Minerals (MULTIVITAMIN WITH MINERALS) tablet, Take 1 tablet by mouth daily., Disp: , Rfl:  .  NEXIUM 40 MG capsule, Take 40 mg by mouth daily., Disp: , Rfl:  .  omega-3 acid ethyl esters (LOVAZA) 1 g capsule, Take 1 g by mouth daily., Disp: , Rfl:  .  Polyethyl Glycol-Propyl Glycol (SYSTANE ULTRA OP), Apply 1 drop to eye as needed., Disp: , Rfl:  .  polyethylene glycol powder (GLYCOLAX/MIRALAX) powder, Take 17 g by mouth daily., Disp: , Rfl: 12 .  potassium chloride (K-DUR) 10 MEQ tablet, Take 10 mEq by mouth daily., Disp: , Rfl:  .  ranitidine (ZANTAC) 150 MG capsule, Take 150 mg by mouth every evening., Disp: , Rfl:  .  sodium chloride (OCEAN) 0.65 % SOLN nasal spray, Place 1 spray into both nostrils as needed for congestion., Disp: , Rfl:  .  tiZANidine (ZANAFLEX) 4 MG tablet, Take 4 mg by mouth every 8 (eight) hours as needed for muscle spasms., Disp: , Rfl:   .  traMADol (ULTRAM) 50 MG tablet, Take 50-100 mg by mouth every 8 (eight) hours as needed for severe pain. , Disp: , Rfl:  .  traZODone (DESYREL) 100 MG tablet, Take 100 mg by mouth at bedtime.  , Disp: , Rfl:   PAST MEDICAL HISTORY: Past Medical History:  Diagnosis Date  . Anemia    HX  . Anxiety   . Arthritis   . Chronic kidney disease    UTI'S  . Complication of anesthesia    BP RISES WHEN AWAKENING  . Complication of anesthesia    "difficult waking up and can become combative"   . GERD (gastroesophageal reflux disease)   . H/O hiatal hernia   . Headache(784.0)   . Heart murmur   . Hypertension   . Lumbar spinal stenosis   . Lumbar spondylolysis   . MS (multiple sclerosis) ([place])   . Neuromuscular disorder ([place])    MS  . Urinary frequency   . Wears glasses     PAST SURGICAL HISTORY: Past Surgical History:  Procedure Laterality Date  . CHOLECYSTECTOMY    . EYE SURGERY     BLEPHAROPLASTY  . LUMBAR FUSION  05/10/2015  . POSTERIOR FUSION LUMBAR SPINE  04/16/11   L4-5  . TRANSFORAMINAL LUMBAR INTERBODY FUSION (TLIF) WITH PEDICLE SCREW FIXATION 1 LEVEL N/A 05/10/2015   Procedure: TRANSFORAMINAL LUMBAR INTERBODY FUSION (TLIF) WITH PEDICLE SCREW FIXATION L3-4;  Surgeon: Eustace Moore, MD;  Location: [facility];  Service: Neurosurgery;  Laterality: N/A;  TRANSFORAMINAL LUMBAR INTERBODY FUSION (TLIF) WITH PEDICLE SCREW FIXATION L3-4  . UTERINE FIBROID EMBOLIZATION      FAMILY HISTORY: Family History  Problem Relation Age of Onset  . Lung cancer Mother   . Emphysema Father   . Heart disease      SOCIAL HISTORY:  Social History   Social History  . Marital status: Single    Spouse name: N/A  . Number of children: 0  . Years of education: 14   Occupational History  .      Does not work - Disabled   Social History Main Topics  . Smoking status: Former Smoker    Packs/day: 2.00    Quit date: 04/08/1996  . Smokeless tobacco: Never Used  Comment: ALCOHOL IN  PAST  . Alcohol use No  . Drug use: No  . Sexual activity: Yes    Birth control/ protection: Post-menopausal   Other Topics Concern  . Not on file   Social History Narrative   Patient does not work she is disabled. and lives at home. Patient is single.   Caffeine: 1-2 cups daily depending on the day   Right handed.     PHYSICAL EXAM  Vitals:   11/23/15 1021  BP: 118/68  Resp: 16  Weight: 169 lb 8 oz (76.9 kg)  Height: 5' 2"  (1.575 m)    Body mass index is 31 kg/m.   General: The patient is well-developed and well-nourished and in no acute distress  Eyes:  Funduscopic exam shows normal optic discs and retinal vessels.  Neck: The neck is supple, no carotid bruits are noted.  The neck is nontender.  Cardiovascular: The heart has a regular rate and rhythm with a normal S1 and S2. There were no murmurs, gallops or rubs. Lungs are clear to auscultation.  Skin: Extremities are without significant edema.  Musculoskeletal:  Back is nontender  Neurologic Exam  Mental status: The patient is alert and oriented x 3 at the time of the examination. The patient has apparent normal recent and remote memory, with an apparently normal attention span and concentration ability.   Speech is normal.  Cranial nerves: Extraocular movements are full. Pupils are equal, round, and reactive to light and accomodation.  Visual fields are full.  Facial symmetry is present. There is good facial sensation to soft touch bilaterally.Facial strength is normal.  Trapezius and sternocleidomastoid strength is normal. No dysarthria is noted.  The tongue is midline, and the patient has symmetric elevation of the soft palate. No obvious hearing deficits are noted.  Motor:  Muscle bulk is normal.   Tone is increased in legs. Strength is  5 / 5 in all 4 extremities.   Sensory: Sensory testing is intact to pinprick, soft touch and vibration sensation in all 4 extremities.  Coordination: Cerebellar testing  reveals good finger-nose-finger and heel-to-shin bilaterally.  Gait and station: Station is normal.   Gait is mildly wide stance and mildly spastic. Tandem gait is wide and not stable. Romberg is negative.   Reflexes: Deep tendon reflexes are symmetric and normal bilaterally.   Plantar responses are flexor.    DIAGNOSTIC DATA (LABS, IMAGING, TESTING) - I reviewed patient records, labs, notes, testing and imaging myself where available.  Lab Results  Component Value Date   WBC 3.7 (L) 05/02/2015   HGB 12.1 05/02/2015   HCT 38.0 05/02/2015   MCV 86.2 05/02/2015   PLT 253 05/02/2015      Component Value Date/Time   NA 141 05/02/2015 0945   K 3.8 05/02/2015 0945   CL 100 (L) 05/02/2015 0945   CO2 31 05/02/2015 0945   GLUCOSE 85 05/02/2015 0945   BUN 16 05/02/2015 0945   CREATININE 0.72 05/02/2015 0945   CALCIUM 10.2 05/02/2015 0945   PROT 6.7 04/06/2008 2015   ALBUMIN 3.9 04/06/2008 2015   AST 23 04/06/2008 2015   ALT 27 04/06/2008 2015   ALKPHOS 69 04/06/2008 2015   BILITOT 0.8 04/06/2008 2015   GFRNONAA >60 05/02/2015 0945   GFRAA >60 05/02/2015 0945   No results found for: CHOL, HDL, LDLCALC, LDLDIRECT, TRIG, CHOLHDL Lab Results  Component Value Date   HGBA1C 5.7 (H) 05/02/2015   No results found for: VITAMINB12 No results found  for: TSH     ASSESSMENT AND PLAN  Multiple sclerosis ([place])  Gait disturbance  Other fatigue  Memory loss  Snoring - Plan: Split night study  Excessive daytime sleepiness - Plan: Split night study   In summary,  Lilybelle Mayeda is a 60 year old woman with multiple sclerosis who appears fairly stable on Betaseron therapy.   Her main symptoms are dysesthesias, fatigue, urinary frequency and insomnia. 1.    To help her dysesthesias more than to help her insomnia I will increase her gabapentin to a total of 1200 mg a day (300-300-600) 2.     She has excessive daytime sleepiness and snores at night. We need to check a split-night sleep  study to determine if she has sleep apnea that should be treated with CPAP.   Patients with MS have a higher risk of sleep apnea compared to neurologically normal patients.   3,   she will continue the Betaseron for now. We briefly discussed that other options are available if she tires of the shots. Sometime next year we will check another MRI of the brain to make sure that there is no subclinical progression. If present, I would recommend a switch in disease modifying therapies.  45 minute face-to-face evaluation with greater than one half of the time counseling and coordinating care about her multiple sclerosis and related symptoms and also coordinating care for her sleep related symptoms and daytime sleepiness.  [name] A. Felecia Shelling, MD, PhD 05/27/8597, 23:41 AM Certified in Neurology, Clinical Neurophysiology, Sleep Medicine, Pain Medicine and Neuroimaging  [facility] [address], [place], [place] [address] [phone]

## 2015-12-26 ENCOUNTER — Encounter: Payer: Self-pay | Admitting: Neurology

## 2015-12-26 ENCOUNTER — Telehealth: Payer: Self-pay | Admitting: Neurology

## 2015-12-26 MED ORDER — GABAPENTIN 100 MG PO CAPS
ORAL_CAPSULE | ORAL | 5 refills | Status: DC
Start: 2015-12-26 — End: 2017-01-14

## 2015-12-26 NOTE — Telephone Encounter (Signed)
Pt called said she is not tolerating the gabapentin (NEURONTIN) 300 MG capsule at 300mg . Sts she feels like she is "drunk" all the time. She is wanting to know if can be reduced to 200mg . Please call

## 2015-12-26 NOTE — Telephone Encounter (Signed)
I have spoken with [name] this afternoon.  Sts. sts. Gabapentin 300-300-600 is too strong--keeps  her feeling "drunk as a skunk."  Sts. she did better on 100-100-200 and would like to decrease back to that dose.  Sts. she does not want to discuss an alternative med at this time. New rx escribed to [facility] per her request/fim

## 2016-01-23 ENCOUNTER — Telehealth: Payer: Self-pay | Admitting: Neurology

## 2016-01-23 NOTE — Telephone Encounter (Signed)
Pt called to advise she is having intense HA's x 1 wk or more. She said she will occassionally get sharp shooting pain. She doesn't know if it is sinus or MS. Please call

## 2016-01-23 NOTE — Telephone Encounter (Signed)
I have spoken with [name] this morning.  Today she has new c/o neck pain, h/a's, more frequent over the last week or so.  No associated sx. Appt. offered this afternoon, but she does not have transportation.  Appt. given 01-28-16 at 1430/fim

## 2016-01-25 ENCOUNTER — Other Ambulatory Visit: Payer: Self-pay | Admitting: Obstetrics and Gynecology

## 2016-01-28 ENCOUNTER — Encounter: Payer: Self-pay | Admitting: Neurology

## 2016-01-28 ENCOUNTER — Ambulatory Visit (INDEPENDENT_AMBULATORY_CARE_PROVIDER_SITE_OTHER): Payer: Medicare Other | Admitting: Neurology

## 2016-01-28 VITALS — BP 126/74 | HR 78 | Resp 14 | Ht 62.0 in | Wt 172.5 lb

## 2016-01-28 DIAGNOSIS — M5481 Occipital neuralgia: Secondary | ICD-10-CM

## 2016-01-28 DIAGNOSIS — M542 Cervicalgia: Secondary | ICD-10-CM

## 2016-01-28 DIAGNOSIS — R269 Unspecified abnormalities of gait and mobility: Secondary | ICD-10-CM

## 2016-01-28 DIAGNOSIS — G35 Multiple sclerosis: Secondary | ICD-10-CM | POA: Diagnosis not present

## 2016-01-28 MED ORDER — BUTALBITAL-APAP-CAFFEINE 50-325-40 MG PO TABS: 1.0000 | ORAL_TABLET | Freq: Four times a day (QID) | ORAL | 3 refills | Status: DC | PRN

## 2016-01-28 NOTE — Progress Notes (Signed)
[facility]  PATIENT: [name] DOB: [birth year]  REFERRING DOCTOR OR PCP:  Dr. Leta Baptist  SOURCE: patient, MRI images on PACS, imaging reports, notes in EPIC and labs in EPIC  _________________________________   HISTORICAL  CHIEF COMPLAINT:  Chief Complaint  Patient presents with  . Multiple Sclerosis    Sts. she continues to tolerate Betaseron well.  Today she here with new c/o daily h/a's onset 7-10 days ago.  No relief with Tylenol. . She has  hx. of h/a's, sts. has taken Fioricet in the past with relief/fim    HISTORY OF PRESENT ILLNESS:  [name] is a 60 year old woman with relapsing remitting multiple sclerosis.    She is experiencing daily headache  MS:   She is currently on Betaseron. She tolerates the injections well. She denies any exacerbation and last MRI did not show any new changes.  HA:   For the past month, she has had chronic headache. The headaches begin in the back of the head and work their way up above for head. Certain positions of her neck and pressure in the back of the head will increase the pain.  Tylenol has not helped. Ice helps a little bit.  She has had headaches like this in the past and Fioricet has helped some.  Gait/strength/sensation:    She feels gait is the same.   She notes difficulty with her gait, due to balance more than to weakness. She uses a cane. . She notes some muscle spasticity in her legs, a little worse on the left. There is mild leg weakness.  Baclofn helps spasticity.  However, she notes more clumsiness with the legs, especially on the left. She has some tingling uncomfortable sensations in her legs and is currently on gabapentin 200 mg 3 times a day. She does not note any significant numbness weakness or clumsiness in the arms.  Bladder/bowel: She has urinary frequency and urgency. She has 1 x nocturia.    She has constipation.  Vision:   She was told that she has had optic neuritis.   She did have an  episode of change in colors in the vision but this lasted less than one day. He wears glasses and needs to get a new prescription for the lenses.   She sees ophthalmology again 02/2016.    Fatigue/sleep: She reports some fatigue that is mostly physical, unchanged form last visit.. She notes it more when she is on her feet for longer period of time. He has difficulty with sleep maintenance insomnia more than with sleep onset. She snores and has rarely woken up gasping for air.  Mood/cognition: She denies any depression or anxiety.  She notes minimal cognitive difficulty. Specifically, she will sometimes have trouble coming up with the right words she feels the cognitive function is stable.  MS History:    In 1983, while at work, she was was having difficulty with her gait and was unsteady and falling. She R doctor and was referred to Dr. Morrell Riddle of [facility] neurology who diagnosed her with multiple sclerosis. She was in the Betaseron drug study (was on placebo) with Dr. Terie Purser in [place]. When Betaseron became available in 1992 or 1993 she started the medication and was seen Dr. Erling Cruz. Over the next few years she had a couple exacerbations that involved leg function, gait or balance. She had some treatments with IV steroids. She also has had some slow worsening with her gait more recently. Her last MRI of the brain and  MRI of the cervical spine was performed in 2015. There were no acute lesions but there was a fair plaque burden in the brain and several foci in the cervical spine.   EPWORTH SLEEPINESS SCALE  On a scale of 0 - 3 what is the chance of dozing:  Sitting and Reading:   3 Watching TV:    3 Sitting inactive in a public place: 2 Passenger in car for one hour: 0 Lying down to rest in the afternoon: 3 Sitting and talking to someone: 0 Sitting quietly after lunch:  3 In a car, stopped in traffic:  0  Total (out of 24):    14/24   MRI Review:   I personally reviewed the MRI of the brain  and cervical spine from 07/25/2013. In the brain, there are extensive T2/FLAIR hyperintense foci involving the cerebellum, left posterior medulla, there was a normal enhancement pattern. The cervical spine showed hyperintense foci adjacent to C3, C5-C6, C6, and T1. Right posterior pons, bilateral hemispheres, predominantly in the periventricular cortical white matter.   None of these enhanced.   REVIEW OF SYSTEMS: Constitutional: No fevers, chills, sweats, or change in appetite.  Fatigue and sleepiness.  She has maintenance insomnia Eyes: No visual changes, double vision, eye pain Ear, nose and throat: No hearing loss, ear pain, nasal congestion, sore throat Cardiovascular: No chest pain, palpitations Respiratory: No shortness of breath at rest or with exertion.   No wheezes.   She snores GastrointestinaI: No nausea, vomiting, diarrhea, abdominal pain, fecal incontinence Genitourinary: as above Musculoskeletal: No neck pain, back pain Integumentary: No rash, pruritus, skin lesions Neurological: as above Psychiatric: No depression at this time.  No anxiety Endocrine: No palpitations, diaphoresis, change in appetite, change in weigh or increased thirst Hematologic/Lymphatic: No anemia, purpura, petechiae. Allergic/Immunologic: No itchy/runny eyes, nasal congestion, recent allergic reactions, rashes  ALLERGIES: Allergies  Allergen Reactions  . Other Other (See Comments)    Pt states she took a medication 2-3 yrs ago that may have been for inflammation and it caused her to be weak.    HOME MEDICATIONS:  Current Outpatient Prescriptions:  .  amLODipine (NORVASC) 10 MG tablet, Take 10 mg by mouth daily.  , Disp: , Rfl:  .  atorvastatin (LIPITOR) 20 MG tablet, Take 20 mg by mouth daily.  , Disp: , Rfl:  .  baclofen (LIORESAL) 20 MG tablet, Take 20 mg by mouth 3 (three) times daily. 1 tab twice daily, and 2 tablets before bedtime, Disp: , Rfl:  .  Calcium Citrate-Vitamin D (CALCIUM + D  PO), Take 1 capsule by mouth daily., Disp: , Rfl:  .  DULoxetine (CYMBALTA) 60 MG capsule, Take 1 capsule by mouth daily., Disp: , Rfl:  .  ENSURE (ENSURE), Take 237 mLs by mouth 2 (two) times daily as needed., Disp: , Rfl:  .  fluticasone (FLONASE) 50 MCG/ACT nasal spray, Place 2 sprays into the nose 2 (two) times daily as needed. For stuffy nose. , Disp: , Rfl:  .  gabapentin (NEURONTIN) 100 MG capsule, Take one capsule in the morning, one in the afternoon, and two at bedtime., Disp: 120 capsule, Rfl: 5 .  ibuprofen (ADVIL,MOTRIN) 600 MG tablet, Take 1 tablet by mouth every 8 (eight) hours as needed for mild pain or moderate pain. , Disp: , Rfl:  .  Interferon Beta-1b (BETASERON) 0.3 MG KIT injection, RECONSTITUTE WITH 1.2 ML DILUENT AND INJECT 1 ML OR 0.25MG SUBCUTANEOUSLY EVERY OTHER DAY, Disp: 14 each, Rfl: PRN .  lisinopril-hydrochlorothiazide (PRINZIDE,ZESTORETIC) 20-25 MG per tablet, Take 1 tablet by mouth daily.  , Disp: , Rfl:  .  montelukast (SINGULAIR) 10 MG tablet, Take 10 mg by mouth at bedtime.  , Disp: , Rfl:  .  Multiple Vitamins-Minerals (MULTIVITAMIN WITH MINERALS) tablet, Take 1 tablet by mouth daily., Disp: , Rfl:  .  NEXIUM 40 MG capsule, Take 40 mg by mouth daily., Disp: , Rfl:  .  omega-3 acid ethyl esters (LOVAZA) 1 g capsule, Take 1 g by mouth daily., Disp: , Rfl:  .  Polyethyl Glycol-Propyl Glycol (SYSTANE ULTRA OP), Apply 1 drop to eye as needed., Disp: , Rfl:  .  polyethylene glycol powder (GLYCOLAX/MIRALAX) powder, Take 17 g by mouth daily., Disp: , Rfl: 12 .  potassium chloride (K-DUR) 10 MEQ tablet, Take 10 mEq by mouth daily., Disp: , Rfl:  .  ranitidine (ZANTAC) 150 MG capsule, Take 150 mg by mouth every evening., Disp: , Rfl:  .  sodium chloride (OCEAN) 0.65 % SOLN nasal spray, Place 1 spray into both nostrils as needed for congestion., Disp: , Rfl:  .  tiZANidine (ZANAFLEX) 4 MG tablet, Take 4 mg by mouth every 8 (eight) hours as needed for muscle spasms., Disp: ,  Rfl:  .  traMADol (ULTRAM) 50 MG tablet, Take 50-100 mg by mouth every 8 (eight) hours as needed for severe pain. , Disp: , Rfl:  .  traZODone (DESYREL) 100 MG tablet, Take 100 mg by mouth at bedtime.  , Disp: , Rfl:  .  butalbital-acetaminophen-caffeine (FIORICET, ESGIC) 50-325-40 MG tablet, Take 1 tablet by mouth every 6 (six) hours as needed for headache. Must last a month or more before refill, Disp: 30 tablet, Rfl: 3  PAST MEDICAL HISTORY: Past Medical History:  Diagnosis Date  . Anemia    HX  . Anxiety   . Arthritis   . Chronic kidney disease    UTI'S  . Complication of anesthesia    BP RISES WHEN AWAKENING  . Complication of anesthesia    "difficult waking up and can become combative"   . GERD (gastroesophageal reflux disease)   . H/O hiatal hernia   . Headache(784.0)   . Heart murmur   . Hypertension   . Lumbar spinal stenosis   . Lumbar spondylolysis   . MS (multiple sclerosis) ([place])   . Neuromuscular disorder ([place])    MS  . Urinary frequency   . Wears glasses     PAST SURGICAL HISTORY: Past Surgical History:  Procedure Laterality Date  . CHOLECYSTECTOMY    . EYE SURGERY     BLEPHAROPLASTY  . LUMBAR FUSION  05/10/2015  . POSTERIOR FUSION LUMBAR SPINE  04/16/11   L4-5  . TRANSFORAMINAL LUMBAR INTERBODY FUSION (TLIF) WITH PEDICLE SCREW FIXATION 1 LEVEL N/A 05/10/2015   Procedure: TRANSFORAMINAL LUMBAR INTERBODY FUSION (TLIF) WITH PEDICLE SCREW FIXATION L3-4;  Surgeon: Eustace Moore, MD;  Location: [facility];  Service: Neurosurgery;  Laterality: N/A;  TRANSFORAMINAL LUMBAR INTERBODY FUSION (TLIF) WITH PEDICLE SCREW FIXATION L3-4  . UTERINE FIBROID EMBOLIZATION      FAMILY HISTORY: Family History  Problem Relation Age of Onset  . Lung cancer Mother   . Emphysema Father   . Heart disease      SOCIAL HISTORY:  Social History   Social History  . Marital status: Single    Spouse name: N/A  . Number of children: 0  . Years of education: 14    Occupational History  .  Does not work - Disabled   Social History Main Topics  . Smoking status: Former Smoker    Packs/day: 2.00    Quit date: 04/08/1996  . Smokeless tobacco: Never Used     Comment: ALCOHOL IN PAST  . Alcohol use No  . Drug use: No  . Sexual activity: Yes    Birth control/ protection: Post-menopausal   Other Topics Concern  . Not on file   Social History Narrative   Patient does not work she is disabled. and lives at home. Patient is single.   Caffeine: 1-2 cups daily depending on the day   Right handed.     PHYSICAL EXAM  Vitals:   01/28/16 1330  BP: 126/74  Pulse: 78  Resp: 14  Weight: 172 lb 8 oz (78.2 kg)  Height: _0  (1.575 m)    Body mass index is 31.55 kg/m.   General: The patient is well-developed and well-nourished and in no acute distress  Neck: The neck is supple, no carotid bruits are noted.  The neck is tender Over the occiput bilaterally. Lifting her head reduces the pain.  Skin: Extremities are without rash or edema.  Neurologic Exam  Mental status: The patient is alert and oriented x 3 at the time of the examination. The patient has apparent normal recent and remote memory, with an apparently normal attention span and concentration ability.   Speech is normal.  Cranial nerves: Extraocular movements are full. There is good facial sensation to soft touch bilaterally.Facial strength is normal.  Trapezius and sternocleidomastoid strength is normal. No dysarthria is noted.  The tongue is midline, and the patient has symmetric elevation of the soft palate. No obvious hearing deficits are noted.  Motor:  Muscle bulk is normal.   Tone is increased in legs. Strength is  5 / 5 in all 4 extremities.   Sensory: Sensory testing is intact to pinprick, soft touch and vibration sensation in all 4 extremities.  Coordination: Cerebellar testing reveals good finger-nose-finger and heel-to-shin bilaterally.  Gait and station: Station  is normal.   Gait is mildly wide stance and mildly spastic. Tandem gait is wide and not stable. Romberg is negative.   Reflexes: Deep tendon reflexes are symmetric and normal bilaterally.       DIAGNOSTIC DATA (LABS, IMAGING, TESTING) - I reviewed patient records, labs, notes, testing and imaging myself where available.  Lab Results  Component Value Date   WBC 3.7 (L) 05/02/2015   HGB 12.1 05/02/2015   HCT 38.0 05/02/2015   MCV 86.2 05/02/2015   PLT 253 05/02/2015      Component Value Date/Time   NA 141 05/02/2015 0945   K 3.8 05/02/2015 0945   CL 100 (L) 05/02/2015 0945   CO2 31 05/02/2015 0945   GLUCOSE 85 05/02/2015 0945   BUN 16 05/02/2015 0945   CREATININE 0.72 05/02/2015 0945   CALCIUM 10.2 05/02/2015 0945   PROT 6.7 04/06/2008 2015   ALBUMIN 3.9 04/06/2008 2015   AST 23 04/06/2008 2015   ALT 27 04/06/2008 2015   ALKPHOS 69 04/06/2008 2015   BILITOT 0.8 04/06/2008 2015   GFRNONAA >60 05/02/2015 0945   GFRAA >60 05/02/2015 0945   No results found for: CHOL, HDL, LDLCALC, LDLDIRECT, TRIG, CHOLHDL Lab Results  Component Value Date   HGBA1C 5.7 (H) 05/02/2015   No results found for: VITAMINB12 No results found for: TSH     ASSESSMENT AND PLAN  Multiple sclerosis ([place])  Gait disturbance  Neck  pain  Bilateral occipital neuralgia  1.   Bilateral splenius capitis muscle injections with 80 mg Depo-Medrol in [place] using sterile technique.   She tolerated the injections well 2.   Fioricet prn 3,   Continue Betaseron for now.   Sometime next year we will check another MRI of the brain to make sure that there is no subclinical progression. If present, I would recommend a switch in disease modifying therapies.   Richard A. Felecia Shelling, MD, PhD 59/11/3568, 1:77 PM Certified in Neurology, Clinical Neurophysiology, Sleep Medicine, Pain Medicine and Neuroimaging  [facility] [address], [place], [place] [address] [phone]

## 2016-01-30 ENCOUNTER — Other Ambulatory Visit: Payer: Self-pay | Admitting: Obstetrics and Gynecology

## 2016-01-30 DIAGNOSIS — Z1231 Encounter for screening mammogram for malignant neoplasm of breast: Secondary | ICD-10-CM

## 2016-02-04 ENCOUNTER — Other Ambulatory Visit: Payer: Self-pay | Admitting: Obstetrics and Gynecology

## 2016-02-04 DIAGNOSIS — N6489 Other specified disorders of breast: Secondary | ICD-10-CM

## 2016-02-04 DIAGNOSIS — N644 Mastodynia: Secondary | ICD-10-CM

## 2016-03-11 ENCOUNTER — Telehealth: Payer: Self-pay | Admitting: *Deleted

## 2016-03-11 DIAGNOSIS — G35 Multiple sclerosis: Secondary | ICD-10-CM

## 2016-03-11 MED ORDER — INTERFERON BETA-1B 0.3 MG ~~LOC~~ KIT: PACK | SUBCUTANEOUS | 3 refills | Status: DC

## 2016-03-11 NOTE — Telephone Encounter (Signed)
Betaseron r/f per faxed request/fim

## 2016-03-21 ENCOUNTER — Other Ambulatory Visit: Payer: Medicare Other

## 2016-03-25 ENCOUNTER — Other Ambulatory Visit: Payer: Medicare Other

## 2016-04-11 ENCOUNTER — Other Ambulatory Visit: Payer: Self-pay | Admitting: Diagnostic Neuroimaging

## 2016-04-15 ENCOUNTER — Other Ambulatory Visit: Payer: Medicare Other

## 2016-04-23 ENCOUNTER — Ambulatory Visit
Admission: RE | Admit: 2016-04-23 | Discharge: 2016-04-23 | Disposition: A | Discharge status: Home / Self Care | Payer: Medicare Other | Source: Ambulatory Visit | Attending: Obstetrics and Gynecology | Admitting: Obstetrics and Gynecology

## 2016-04-23 DIAGNOSIS — N6489 Other specified disorders of breast: Secondary | ICD-10-CM

## 2016-04-23 DIAGNOSIS — N644 Mastodynia: Secondary | ICD-10-CM

## 2016-06-30 ENCOUNTER — Telehealth: Payer: Self-pay

## 2016-06-30 NOTE — Telephone Encounter (Signed)
Patient refused to schedule sleep study 12/2015.

## 2016-07-29 ENCOUNTER — Ambulatory Visit: Payer: Medicare Other | Admitting: Neurology

## 2016-09-09 ENCOUNTER — Telehealth: Payer: Self-pay | Admitting: Neurology

## 2016-09-09 NOTE — Telephone Encounter (Signed)
I have spoken with [name] this morning.  She needs SCAT paperwork completed and wanted to know if this could be done the day of her appt.  I have advised that she should bring it in before then, as we have a 2 wk. turn around time for paperwork, depending on the length.  I can complete it and let her know once it's done.  She verbalized understanding of same, sts. will mail forms to my attn/fim

## 2016-09-09 NOTE — Telephone Encounter (Signed)
Patient called office in reference to Dr. Felecia Shelling needing to complete papers ([place]) to verify patients disability.  Advised patient of the possible $50 charge for paperwork patient states she would like to speak with Dr. Garth Bigness RN.  Please call

## 2016-09-15 NOTE — Telephone Encounter (Signed)
Patient called office returning RN's call.  Please call °

## 2016-09-15 NOTE — Telephone Encounter (Signed)
I have spoken with [name] this afternoon and advised that I no longer have a question--paperwork was completed this afternoon and mailed to [facility] in the envelope provided by pt/fim

## 2016-09-30 ENCOUNTER — Ambulatory Visit (INDEPENDENT_AMBULATORY_CARE_PROVIDER_SITE_OTHER): Payer: Medicare Other | Admitting: Neurology

## 2016-09-30 ENCOUNTER — Encounter: Payer: Self-pay | Admitting: Neurology

## 2016-09-30 VITALS — BP 128/69 | HR 73 | Resp 18 | Ht 62.0 in | Wt 171.5 lb

## 2016-09-30 DIAGNOSIS — R5383 Other fatigue: Secondary | ICD-10-CM | POA: Diagnosis not present

## 2016-09-30 DIAGNOSIS — R3915 Urgency of urination: Secondary | ICD-10-CM

## 2016-09-30 DIAGNOSIS — G43009 Migraine without aura, not intractable, without status migrainosus: Secondary | ICD-10-CM | POA: Diagnosis not present

## 2016-09-30 DIAGNOSIS — G35 Multiple sclerosis: Secondary | ICD-10-CM | POA: Diagnosis not present

## 2016-09-30 DIAGNOSIS — M542 Cervicalgia: Secondary | ICD-10-CM

## 2016-09-30 DIAGNOSIS — G4719 Other hypersomnia: Secondary | ICD-10-CM | POA: Diagnosis not present

## 2016-09-30 DIAGNOSIS — R269 Unspecified abnormalities of gait and mobility: Secondary | ICD-10-CM | POA: Diagnosis not present

## 2016-09-30 MED ORDER — BACLOFEN 10 MG PO TABS: ORAL_TABLET | ORAL | 11 refills | Status: DC

## 2016-09-30 NOTE — Progress Notes (Signed)
[facility]  PATIENT: [name] DOB: 07/19/1955  REFERRING DOCTOR OR PCP:  Dr. Leta Baptist  SOURCE: patient, MRI images on PACS, imaging reports, notes in EPIC and labs in EPIC  _________________________________   HISTORICAL  CHIEF COMPLAINT:  Chief Complaint  Patient presents with  . Multiple Sclerosis    Sts. she continues to tolerate Betaseron well.  Believes generalized weakness is some worse.  Is using rolling walker more often. H/A's are off/on--dull h/a today.  Some neck pain--hears crunching noise in neck with movement/fim  . Headache    HISTORY OF PRESENT ILLNESS:  [name] is a 61 year old woman with relapsing remitting multiple sclerosis.    She is experiencing daily headache  MS:   She is currently on Betaseron. She tolerates the injections well. She denies any exacerbation and last MRI did not show any new changes.  HA/neck pain:   She is having fewer HA's and they are no longer daily.    She continues to have neck pain. Certain movement of her neck increase the pain.     Tylenol has not helped. Ice helps a little bit.  She has had headaches like this in the past and Fioricet has helped some.   A splenius capitus/occipitla nerve injection only helped a little bit.    Gait/strength/sensation:    She feels gait is the same.   Gait issues are mostly due to poor balance.    She uses a cane for short walks and a walker for longer distances.  She has muscle spasticity in legs, helped by baclofen.     Dysesthesias are helped by gabapentin but she takes just 300 mg nightly.   She does not note any significant numbness weakness or clumsiness in the arms.  Bladder/bowel: She has urinary frequency and urgency.She has incontinence at times and needs to use a pad.   She has 1 x nocturia.    She has constipation.  Vision:   She had optic neuritis in the past but has no sequela of it and her ophthalmologist told her that the eye look good.   She wears glasses.      Fatigue/sleep: She reports some fatigue and also notes more sleepiness tha at the last visit.  Pysical fatoigue is worse when she is more active.  She has difficulty with sleep maintenance insomnia more than with sleep onset. She snores and has rarely woken up gasping for air.  Mood/cognition: She denies any depression or anxiety.  She notes minimal cognitive difficulty. Specifically, she will sometimes have trouble coming up with the right words she feels the cognitive function is stable.  MS History:    In 1983, while at work, she was having difficulty with her gait and was unsteady and falling. She R doctor and was referred to Dr. Morrell Riddle of [facility] neurology who diagnosed her with multiple sclerosis. She was in the Betaseron drug study (was on placebo) with Dr. Terie Purser in [place]. When Betaseron became available in 1992 or 1993 she started the medication and was seen Dr. Erling Cruz. Over the next few years she had a couple exacerbations that involved leg function, gait or balance. She had some treatments with IV steroids. She also has had some slow worsening with her gait more recently. Her last MRI of the brain and MRI of the cervical spine was performed in 2015. There were no acute lesions but there was a fair plaque burden in the brain and several foci in the cervical spine.  EPWORTH SLEEPINESS SCALE  On a scale of 0 - 3 what is the chance of dozing:  Sitting and Reading:   3 Watching TV:    3 Sitting inactive in a public place: 2 Passenger in car for one hour: 0 Lying down to rest in the afternoon: 3 Sitting and talking to someone: 0 Sitting quietly after lunch:  3 In a car, stopped in traffic:  0  Total (out of 24):    14/24   MRI Review:   I personally reviewed the MRI of the brain and cervical spine from 07/25/2013. In the brain, there are extensive T2/FLAIR hyperintense foci involving the cerebellum, left posterior medulla, there was a normal enhancement pattern. The cervical  spine showed hyperintense foci adjacent to C3, C5-C6, C6, and T1. Right posterior pons, bilateral hemispheres, predominantly in the periventricular cortical white matter.   None of these enhanced.   REVIEW OF SYSTEMS: Constitutional: No fevers, chills, sweats, or change in appetite.  Fatigue and sleepiness.  She has maintenance insomnia Eyes: No visual changes, double vision, eye pain Ear, nose and throat: No hearing loss, ear pain, nasal congestion, sore throat Cardiovascular: No chest pain, palpitations Respiratory: No shortness of breath at rest or with exertion.   No wheezes.   She snores GastrointestinaI: No nausea, vomiting, diarrhea, abdominal pain, fecal incontinence Genitourinary: as above Musculoskeletal: No neck pain, back pain Integumentary: No rash, pruritus, skin lesions Neurological: as above Psychiatric: No depression at this time.  No anxiety Endocrine: No palpitations, diaphoresis, change in appetite, change in weigh or increased thirst Hematologic/Lymphatic: No anemia, purpura, petechiae. Allergic/Immunologic: No itchy/runny eyes, nasal congestion, recent allergic reactions, rashes  ALLERGIES: Allergies  Allergen Reactions  . Other Other (See Comments)    Pt states she took a medication 2-3 yrs ago that may have been for inflammation and it caused her to be weak.    HOME MEDICATIONS:  Current Outpatient Prescriptions:  .  amLODipine (NORVASC) 10 MG tablet, Take 10 mg by mouth daily.  , Disp: , Rfl:  .  atorvastatin (LIPITOR) 20 MG tablet, Take 20 mg by mouth daily.  , Disp: , Rfl:  .  baclofen (LIORESAL) 10 MG tablet, 1 tab twice daily, and 2 tablets before bedtime, Disp: 120 each, Rfl: 11 .  butalbital-acetaminophen-caffeine (FIORICET, ESGIC) 50-325-40 MG tablet, Take 1 tablet by mouth every 6 (six) hours as needed for headache. Must last a month or more before refill, Disp: 30 tablet, Rfl: 3 .  Calcium Citrate-Vitamin D (CALCIUM + D PO), Take 1 capsule by  mouth daily., Disp: , Rfl:  .  DULoxetine (CYMBALTA) 60 MG capsule, Take 1 capsule by mouth daily., Disp: , Rfl:  .  ENSURE (ENSURE), Take 237 mLs by mouth 2 (two) times daily as needed., Disp: , Rfl:  .  fluticasone (FLONASE) 50 MCG/ACT nasal spray, Place 2 sprays into the nose 2 (two) times daily as needed. For stuffy nose. , Disp: , Rfl:  .  gabapentin (NEURONTIN) 100 MG capsule, Take one capsule in the morning, one in the afternoon, and two at bedtime., Disp: 120 capsule, Rfl: 5 .  ibuprofen (ADVIL,MOTRIN) 600 MG tablet, Take 1 tablet by mouth every 8 (eight) hours as needed for mild pain or moderate pain. , Disp: , Rfl:  .  Interferon Beta-1b (BETASERON) 0.3 MG KIT injection, RECONSTITUTE WITH 1.2 ML DILUENT AND INJECT 1 ML OR 0.25MG SUBCUTANEOUSLY EVERY OTHER DAY, Disp: 36 each, Rfl: 3 .  lisinopril-hydrochlorothiazide (PRINZIDE,ZESTORETIC) 20-25 MG per tablet,  Take 1 tablet by mouth daily.  , Disp: , Rfl:  .  montelukast (SINGULAIR) 10 MG tablet, Take 10 mg by mouth at bedtime.  , Disp: , Rfl:  .  Multiple Vitamins-Minerals (MULTIVITAMIN WITH MINERALS) tablet, Take 1 tablet by mouth daily., Disp: , Rfl:  .  NEXIUM 40 MG capsule, Take 40 mg by mouth daily., Disp: , Rfl:  .  omega-3 acid ethyl esters (LOVAZA) 1 g capsule, Take 1 g by mouth daily., Disp: , Rfl:  .  Polyethyl Glycol-Propyl Glycol (SYSTANE ULTRA OP), Apply 1 drop to eye as needed., Disp: , Rfl:  .  polyethylene glycol powder (GLYCOLAX/MIRALAX) powder, Take 17 g by mouth daily., Disp: , Rfl: 12 .  potassium chloride (K-DUR) 10 MEQ tablet, Take 10 mEq by mouth daily., Disp: , Rfl:  .  ranitidine (ZANTAC) 150 MG capsule, Take 150 mg by mouth every evening., Disp: , Rfl:  .  sodium chloride (OCEAN) 0.65 % SOLN nasal spray, Place 1 spray into both nostrils as needed for congestion., Disp: , Rfl:  .  tiZANidine (ZANAFLEX) 4 MG tablet, Take 4 mg by mouth every 8 (eight) hours as needed for muscle spasms., Disp: , Rfl:  .  traMADol  (ULTRAM) 50 MG tablet, Take 50-100 mg by mouth every 8 (eight) hours as needed for severe pain. , Disp: , Rfl:  .  traZODone (DESYREL) 100 MG tablet, Take 100 mg by mouth at bedtime.  , Disp: , Rfl:   PAST MEDICAL HISTORY: Past Medical History:  Diagnosis Date  . Anemia    HX  . Anxiety   . Arthritis   . Chronic kidney disease    UTI'S  . Complication of anesthesia    BP RISES WHEN AWAKENING  . Complication of anesthesia    "difficult waking up and can become combative"   . GERD (gastroesophageal reflux disease)   . H/O hiatal hernia   . Headache(784.0)   . Heart murmur   . Hypertension   . Lumbar spinal stenosis   . Lumbar spondylolysis   . MS (multiple sclerosis) ([place])   . Neuromuscular disorder ([place])    MS  . Urinary frequency   . Wears glasses     PAST SURGICAL HISTORY: Past Surgical History:  Procedure Laterality Date  . CHOLECYSTECTOMY    . EYE SURGERY     BLEPHAROPLASTY  . LUMBAR FUSION  05/10/2015  . POSTERIOR FUSION LUMBAR SPINE  04/16/11   L4-5  . TRANSFORAMINAL LUMBAR INTERBODY FUSION (TLIF) WITH PEDICLE SCREW FIXATION 1 LEVEL N/A 05/10/2015   Procedure: TRANSFORAMINAL LUMBAR INTERBODY FUSION (TLIF) WITH PEDICLE SCREW FIXATION L3-4;  Surgeon: Eustace Moore, MD;  Location: [facility];  Service: Neurosurgery;  Laterality: N/A;  TRANSFORAMINAL LUMBAR INTERBODY FUSION (TLIF) WITH PEDICLE SCREW FIXATION L3-4  . UTERINE FIBROID EMBOLIZATION      FAMILY HISTORY: Family History  Problem Relation Age of Onset  . Lung cancer Mother   . Emphysema Father   . Heart disease Unknown     SOCIAL HISTORY:  Social History   Social History  . Marital status: Single    Spouse name: N/A  . Number of children: 0  . Years of education: 14   Occupational History  .      Does not work - Disabled   Social History Main Topics  . Smoking status: Former Smoker    Packs/day: 2.00    Quit date: 04/08/1996  . Smokeless tobacco: Never Used  Comment: ALCOHOL IN PAST    . Alcohol use No  . Drug use: No  . Sexual activity: Yes    Birth control/ protection: Post-menopausal   Other Topics Concern  . Not on file   Social History Narrative   Patient does not work she is disabled. and lives at home. Patient is single.   Caffeine: 1-2 cups daily depending on the day   Right handed.     PHYSICAL EXAM  Vitals:   09/30/16 0823  BP: 128/69  Pulse: 73  Resp: 18  Weight: 171 lb 8 oz (77.8 kg)  Height: 5' 2"  (1.575 m)    Body mass index is 31.37 kg/m.   General: The patient is well-developed and well-nourished and in no acute distress  Neck: Her neck has a slightly reduced range of motion and is mildly tender over the occiput bilaterally.  CV: Pulse was normal rate (72) and regular but she did have several pauses over 1 minute.  Skin: Extremities are without rash or edema.  Neurologic Exam  Mental status: The patient is alert and oriented x 3 at the time of the examination. The patient has apparent normal recent and remote memory, with an apparently normal attention span and concentration ability.   Speech is normal.  Cranial nerves: Extraocular movements are full. Facial strength and sensation was normal.  The tongue is midline, and the patient has symmetric elevation of the soft palate. No obvious hearing deficits are noted.  Motor:  Muscle bulk is normal.   Tone is increased in legs. Strength was 5/5.  Sensory: She has normal touch and vibration sensation in the arms and legs..  Coordination: She has good finger-nose-finger and mildly reduced heel-to-shin bilaterally.  Gait and station: Station is normal.   The gait is mildly wide. She has slight spasticity with her walking. The tandem gait is difficult for her to do. Romberg is negative.   Reflexes: Deep tendon reflexes are symmetric and normal bilaterally.       DIAGNOSTIC DATA (LABS, IMAGING, TESTING) - I reviewed patient records, labs, notes, testing and imaging myself where  available.  Lab Results  Component Value Date   WBC 3.7 (L) 05/02/2015   HGB 12.1 05/02/2015   HCT 38.0 05/02/2015   MCV 86.2 05/02/2015   PLT 253 05/02/2015      Component Value Date/Time   NA 141 05/02/2015 0945   K 3.8 05/02/2015 0945   CL 100 (L) 05/02/2015 0945   CO2 31 05/02/2015 0945   GLUCOSE 85 05/02/2015 0945   BUN 16 05/02/2015 0945   CREATININE 0.72 05/02/2015 0945   CALCIUM 10.2 05/02/2015 0945   PROT 6.7 04/06/2008 2015   ALBUMIN 3.9 04/06/2008 2015   AST 23 04/06/2008 2015   ALT 27 04/06/2008 2015   ALKPHOS 69 04/06/2008 2015   BILITOT 0.8 04/06/2008 2015   GFRNONAA >60 05/02/2015 0945   GFRAA >60 05/02/2015 0945   No results found for: CHOL, HDL, LDLCALC, LDLDIRECT, TRIG, CHOLHDL Lab Results  Component Value Date   HGBA1C 5.7 (H) 05/02/2015   No results found for: VITAMINB12 No results found for: TSH     ASSESSMENT AND PLAN  Multiple sclerosis ([place]) - Plan: MR BRAIN W WO CONTRAST, CBC with Differential/Platelet, Comprehensive metabolic panel  Migraine without aura and without status migrainosus, not intractable  Gait disturbance - Plan: MR BRAIN W WO CONTRAST  Excessive daytime sleepiness  Neck pain  Other fatigue  Urinary urgency  1.  change baclofen  dose to 01-01-19 from 20 mg tid as she is sleepy.   Consider cutting tizanidine at bedtime to 1/2 pill or stopping.   2.   Fioricet prn 3,   Continue Betaseron for now.   Check brain MRI of the brain to make sure that there is no subclinical progression. If subclinical progression is present, I would recommend a switch in disease modifying therapies. 4,   She has some palpitations.   She states she is seeing her PCP next week and she will address with her.     Richard A. Felecia Shelling, MD, PhD 9/43/7005, 2:59 AM Certified in Neurology, Clinical Neurophysiology, Sleep Medicine, Pain Medicine and Neuroimaging  [facility] [address], [place], [place] [address] [phone]

## 2016-10-01 ENCOUNTER — Telehealth: Payer: Self-pay | Admitting: Neurology

## 2016-10-01 LAB — COMPREHENSIVE METABOLIC PANEL
ALBUMIN: 4.8 g/dL (ref 3.6–4.8)
ALK PHOS: 107 IU/L (ref 39–117)
ALT: 31 IU/L (ref 0–32)
AST: 20 IU/L (ref 0–40)
Albumin/Globulin Ratio: 2.3 — ABNORMAL HIGH (ref 1.2–2.2)
BILIRUBIN TOTAL: 0.3 mg/dL (ref 0.0–1.2)
BUN / CREAT RATIO: 20 (ref 12–28)
BUN: 15 mg/dL (ref 8–27)
CHLORIDE: 99 mmol/L (ref 96–106)
CO2: 30 mmol/L — ABNORMAL HIGH (ref 20–29)
Calcium: 10.1 mg/dL (ref 8.7–10.3)
Creatinine, Ser: 0.75 mg/dL (ref 0.57–1.00)
GFR calc non Af Amer: 86 mL/min/{1.73_m2} (ref >59)
GFR, EST AFRICAN AMERICAN: 99 mL/min/{1.73_m2} (ref >59)
GLOBULIN, TOTAL: 2.1 g/dL (ref 1.5–4.5)
Glucose: 109 mg/dL — ABNORMAL HIGH (ref 65–99)
Potassium: 3.7 mmol/L (ref 3.5–5.2)
SODIUM: 142 mmol/L (ref 134–144)
Total Protein: 6.9 g/dL (ref 6.0–8.5)

## 2016-10-01 LAB — CBC WITH DIFFERENTIAL/PLATELET
BASOS: 1 %
Basophils Absolute: 0 10*3/uL (ref 0.0–0.2)
EOS (ABSOLUTE): 0.1 10*3/uL (ref 0.0–0.4)
EOS: 3 %
HEMATOCRIT: 37.9 % (ref 34.0–46.6)
HEMOGLOBIN: 12.4 g/dL (ref 11.1–15.9)
Immature Grans (Abs): 0 10*3/uL (ref 0.0–0.1)
Immature Granulocytes: 0 %
LYMPHS ABS: 0.9 10*3/uL (ref 0.7–3.1)
Lymphs: 24 %
MCH: 28.3 pg (ref 26.6–33.0)
MCHC: 32.7 g/dL (ref 31.5–35.7)
MCV: 87 fL (ref 79–97)
MONOCYTES: 9 %
Monocytes Absolute: 0.3 10*3/uL (ref 0.1–0.9)
NEUTROS ABS: 2.4 10*3/uL (ref 1.4–7.0)
Neutrophils: 63 %
Platelets: 225 10*3/uL (ref 150–379)
RBC: 4.38 x10E6/uL (ref 3.77–5.28)
RDW: 14.7 % (ref 12.3–15.4)
WBC: 3.8 10*3/uL (ref 3.4–10.8)

## 2016-10-01 MED ORDER — ALPRAZOLAM 0.5 MG PO TABS: ORAL_TABLET | ORAL | 0 refills | Status: DC

## 2016-10-01 NOTE — Telephone Encounter (Signed)
Patient called office in reference to MRI scheduled for July 25th.  Patient would like to know if she can be prescribed something to help take the edge off due to being claustrophobic.  Pharmacy-   [facility]

## 2016-10-01 NOTE — Telephone Encounter (Signed)
Alprazolam rx. faxed to [facility].  Pt. aware/fim

## 2016-10-01 NOTE — Telephone Encounter (Signed)
I have spoken with Dashayla this morning.  She sts. she had restless sleep last night.  This is an isolated event, does not usually have trouble.  I have explained that for one night of poor sleep, RAS would not do anything different, but if poor sleep becomes a pattern, she should call back.  She verbalized understanding of same/fim

## 2016-10-01 NOTE — Addendum Note (Signed)
Addended by: France Ravens I on: 10/01/2016 10:28 AM   Modules accepted: Orders

## 2016-10-01 NOTE — Telephone Encounter (Signed)
Patient called office in reference to baclofen (LIORESAL) 10 MG tablet.  Patient states she was unable to sleep last night and being real restless and took 1 trazodone and the another half.  She feels its the medication.  Please call

## 2016-10-03 ENCOUNTER — Telehealth: Payer: Self-pay | Admitting: *Deleted

## 2016-10-03 NOTE — Telephone Encounter (Signed)
-----   Message from Britt Bottom, MD sent at 10/01/2016  5:53 PM EDT ----- Please let the patient know that the lab work is fine.

## 2016-10-03 NOTE — Telephone Encounter (Signed)
I have spoken with Tezra this morning and per RAS, advised lab work done in our office is fine.  She verbalized understanding of same/fim

## 2016-10-06 ENCOUNTER — Telehealth: Payer: Self-pay | Admitting: Neurology

## 2016-10-06 NOTE — Telephone Encounter (Signed)
The patient called, she went from taking 20 mg a baclofen one twice during the day and 2 at night to taking 10 mg twice daily. The patient began having withdrawal symptoms. She is to go to 20 mg 3 times daily for one week,  Then take 10 mg twice during the day and 20 mg at night as prescribed on the baclofen bottle.  The patient apparently was not reading the bottle instructions.

## 2016-10-06 NOTE — Telephone Encounter (Signed)
Patient is calling back for a returned call regarding previous message.

## 2016-10-06 NOTE — Telephone Encounter (Signed)
I have spoken with [name] and answered questions./fim

## 2016-10-06 NOTE — Telephone Encounter (Signed)
[place].  I thought all questions were answered in our last conversation.  Dr. Jannifer Franklin has also reviewed med instructions with her, over the weekend.  I am happy to answer further questions if she has them/fim

## 2016-10-06 NOTE — Telephone Encounter (Signed)
Pt called back request to speak with RN to review directions. She can be reached at [phone]

## 2016-10-06 NOTE — Telephone Encounter (Signed)
I have spoken with [name] this morning. She sts. she is doing better since speaking with Dr. Jannifer Franklin and taking Baclofen as rx'd; doesn't think she needs anything further from this office/fim

## 2016-10-06 NOTE — Telephone Encounter (Signed)
Patient would like a returned call. She has questions about baclofen (LIORESAL) 10 MG tablet.

## 2016-10-13 ENCOUNTER — Telehealth: Payer: Self-pay | Admitting: Neurology

## 2016-10-13 NOTE — Telephone Encounter (Signed)
LMTC./fim 

## 2016-10-13 NOTE — Telephone Encounter (Signed)
Patient wants to know if it is alright for her to go back on Baclofen 20MG . It works better than 10MG .

## 2016-10-13 NOTE — Telephone Encounter (Signed)
Pt has returned your call and would like a call back please

## 2016-10-14 NOTE — Telephone Encounter (Signed)
duplicate/fim  

## 2016-10-14 NOTE — Telephone Encounter (Signed)
Patient is returning your call.  

## 2016-10-14 NOTE — Telephone Encounter (Signed)
LMOM that Baclofen was decreased from 20mg  tid to 01/01/19 due to sleepiness, but if she feels better on Baclofen 20mg  tid, she may resume this, per RAS.  Pt. to call if she has other questions/fim

## 2016-10-14 NOTE — Telephone Encounter (Signed)
Pt called back said she just wants an answer if she can start back on baclofen 20mg . She was annoyed with having to call back. She said a VM can be left if she is unavailable

## 2016-10-15 ENCOUNTER — Other Ambulatory Visit: Payer: Medicare Other

## 2016-10-15 ENCOUNTER — Telehealth: Payer: Self-pay | Admitting: Neurology

## 2016-10-15 NOTE — Telephone Encounter (Signed)
Noted/fim 

## 2016-10-15 NOTE — Telephone Encounter (Signed)
Patient called to advise she rescheduled her MRI appointment for today to 10-27-16.

## 2016-10-23 ENCOUNTER — Encounter (HOSPITAL_COMMUNITY): Payer: Self-pay | Admitting: Emergency Medicine

## 2016-10-23 ENCOUNTER — Emergency Department (HOSPITAL_COMMUNITY): Payer: Medicare Other

## 2016-10-23 ENCOUNTER — Emergency Department (HOSPITAL_COMMUNITY)
Admission: EM | Admit: 2016-10-23 | Discharge: 2016-10-23 | Disposition: A | Discharge status: Home / Self Care | Payer: Medicare Other | Attending: Physician Assistant | Admitting: Physician Assistant

## 2016-10-23 DIAGNOSIS — Y929 Unspecified place or not applicable: Secondary | ICD-10-CM | POA: Diagnosis not present

## 2016-10-23 DIAGNOSIS — S0083XA Contusion of other part of head, initial encounter: Secondary | ICD-10-CM | POA: Insufficient documentation

## 2016-10-23 DIAGNOSIS — Z79899 Other long term (current) drug therapy: Secondary | ICD-10-CM | POA: Insufficient documentation

## 2016-10-23 DIAGNOSIS — I10 Essential (primary) hypertension: Secondary | ICD-10-CM | POA: Diagnosis not present

## 2016-10-23 DIAGNOSIS — N3000 Acute cystitis without hematuria: Secondary | ICD-10-CM | POA: Diagnosis not present

## 2016-10-23 DIAGNOSIS — Y939 Activity, unspecified: Secondary | ICD-10-CM | POA: Diagnosis not present

## 2016-10-23 DIAGNOSIS — Y999 Unspecified external cause status: Secondary | ICD-10-CM | POA: Diagnosis not present

## 2016-10-23 DIAGNOSIS — W010XXA Fall on same level from slipping, tripping and stumbling without subsequent striking against object, initial encounter: Secondary | ICD-10-CM | POA: Diagnosis not present

## 2016-10-23 DIAGNOSIS — Z87891 Personal history of nicotine dependence: Secondary | ICD-10-CM | POA: Diagnosis not present

## 2016-10-23 DIAGNOSIS — S0990XA Unspecified injury of head, initial encounter: Secondary | ICD-10-CM | POA: Diagnosis present

## 2016-10-23 LAB — URINALYSIS, ROUTINE W REFLEX MICROSCOPIC
GLUCOSE, UA: NEGATIVE mg/dL
Hgb urine dipstick: NEGATIVE
KETONES UR: NEGATIVE mg/dL
Nitrite: NEGATIVE
PH: 5 (ref 5.0–8.0)
PROTEIN: NEGATIVE mg/dL
SPECIFIC GRAVITY, URINE: 1.031 — AB (ref 1.005–1.030)

## 2016-10-23 MED ORDER — CEPHALEXIN 500 MG PO CAPS
500.0000 mg | ORAL_CAPSULE | Freq: Once | ORAL | Status: AC
  Administered 2016-10-23: 500 mg via ORAL
  Filled 2016-10-23: qty 1

## 2016-10-23 MED ORDER — CEPHALEXIN 500 MG PO CAPS: 500.0000 mg | ORAL_CAPSULE | Freq: Four times a day (QID) | ORAL | 0 refills | Status: DC

## 2016-10-23 NOTE — ED Notes (Signed)
Bed: WA18 Expected date:  Expected time:  Means of arrival:  Comments: EMS-fall 

## 2016-10-23 NOTE — ED Triage Notes (Signed)
Pt was at [place] & says her knees gave out, fell forward onto her face.  Denies LOC or dizziness/weakness.  Swelling & bruising right side of forehead.  Hx MS.  Uses wheeled walker.  A&Ox4. VS: 150/100, 80 bpm, 16 RR, CBG 102

## 2016-10-23 NOTE — ED Provider Notes (Signed)
[facility] DEPT Provider Note   CSN: [id number] Arrival date & time: 10/23/16  1837     History   Chief Complaint Chief Complaint  Patient presents with  . Fall    HPI [name] is a 61 y.o. female.  HPI   Patient's 61 year old female with past nuchal history significant for hypertension hyperlipidemia, multiple sclerosis, walks with a walker. Patient was walking into meeting and had a mechanical fall striking her head on the floor.  No LOC. No dizziness.  Past Medical History:  Diagnosis Date  . Anemia    HX  . Anxiety   . Arthritis   . Chronic kidney disease    UTI'S  . Complication of anesthesia    BP RISES WHEN AWAKENING  . Complication of anesthesia    "difficult waking up and can become combative"   . GERD (gastroesophageal reflux disease)   . H/O hiatal hernia   . Headache(784.0)   . Heart murmur   . Hypertension   . Lumbar spinal stenosis   . Lumbar spondylolysis   . MS (multiple sclerosis) ([place])   . Neuromuscular disorder ([place])    MS  . Urinary frequency   . Wears glasses     Patient Active Problem List   Diagnosis Date Noted  . Urinary urgency 09/30/2016  . Neck pain 01/28/2016  . Bilateral occipital neuralgia 01/28/2016  . Gait disturbance 11/23/2015  . Other fatigue 11/23/2015  . Memory loss 11/23/2015  . Snoring 11/23/2015  . Excessive daytime sleepiness 11/23/2015  . S/P lumbar spinal fusion 05/10/2015  . S/P lumbar fusion 05/10/2015  . Migraine without aura 07/07/2013  . Multiple sclerosis ([place]) 12/30/2012    Past Surgical History:  Procedure Laterality Date  . CHOLECYSTECTOMY    . EYE SURGERY     BLEPHAROPLASTY  . LUMBAR FUSION  05/10/2015  . POSTERIOR FUSION LUMBAR SPINE  04/16/11   L4-5  . TRANSFORAMINAL LUMBAR INTERBODY FUSION (TLIF) WITH PEDICLE SCREW FIXATION 1 LEVEL N/A 05/10/2015   Procedure: TRANSFORAMINAL LUMBAR INTERBODY FUSION (TLIF) WITH PEDICLE SCREW FIXATION L3-4;  Surgeon: Eustace Moore, MD;  Location: [facility]  NEURO ORS;  Service: Neurosurgery;  Laterality: N/A;  TRANSFORAMINAL LUMBAR INTERBODY FUSION (TLIF) WITH PEDICLE SCREW FIXATION L3-4  . UTERINE FIBROID EMBOLIZATION      OB History    No data available       Home Medications    Prior to Admission medications   Medication Sig Start Date End Date Taking? Authorizing Provider  amLODipine (NORVASC) 10 MG tablet Take 10 mg by mouth daily.     Yes [provider]  atorvastatin (LIPITOR) 20 MG tablet Take 20 mg by mouth daily.     Yes [provider]  baclofen (LIORESAL) 20 MG tablet Take 20-40 mg by mouth 3 (three) times daily. Take 1 tablet in the am, lunch and Take 2 tablets in the evening. 09/05/16  Yes [provider]  butalbital-acetaminophen-caffeine (FIORICET, ESGIC) 50-325-40 MG tablet Take 1 tablet by mouth every 6 (six) hours as needed for headache. Must last a month or more before refill 01/28/16  Yes Sater, Nanine Means, MD  Calcium Citrate-Vitamin D (CALCIUM + D PO) Take 1 capsule by mouth daily.   Yes [provider]  DULoxetine (CYMBALTA) 60 MG capsule Take 1 capsule by mouth daily. 12/23/13  Yes [provider]  fluticasone (FLONASE) 50 MCG/ACT nasal spray Place 2 sprays into the nose daily. For stuffy nose.    Yes [provider]  gabapentin (NEURONTIN) 100 MG capsule Take one capsule in the morning, one in the afternoon, and two at bedtime. 12/26/15  Yes Sater, Nanine Means, MD  ibuprofen (ADVIL,MOTRIN) 600 MG tablet Take 1 tablet by mouth every 8 (eight) hours as needed for mild pain or moderate pain.  06/23/13  Yes [provider]  Interferon Beta-1b (BETASERON) 0.3 MG KIT injection RECONSTITUTE WITH 1.2 ML DILUENT AND INJECT 1 ML OR 0.25MG SUBCUTANEOUSLY EVERY OTHER DAY 03/11/16  Yes Sater, Nanine Means, MD  lisinopril-hydrochlorothiazide (PRINZIDE,ZESTORETIC) 20-25 MG per tablet Take 1 tablet by mouth daily.     Yes [provider]  montelukast (SINGULAIR) 10 MG tablet  Take 10 mg by mouth at bedtime.     Yes [provider]  Multiple Vitamins-Minerals (MULTIVITAMIN WITH MINERALS) tablet Take 1 tablet by mouth daily.   Yes [provider]  NEXIUM 40 MG capsule Take 40 mg by mouth daily. 11/01/12  Yes [provider]  omega-3 acid ethyl esters (LOVAZA) 1 g capsule Take 1 g by mouth daily.   Yes [provider]  Polyethyl Glycol-Propyl Glycol (SYSTANE ULTRA OP) Apply 1 drop to eye daily as needed (dry eyes).    Yes [provider]  potassium chloride (K-DUR) 10 MEQ tablet Take 10 mEq by mouth daily.   Yes [provider]  ranitidine (ZANTAC) 150 MG capsule Take 150 mg by mouth every evening.   Yes [provider]  traMADol (ULTRAM) 50 MG tablet Take 50 mg by mouth every 8 (eight) hours as needed for severe pain.  11/17/12  Yes [provider]  traZODone (DESYREL) 100 MG tablet Take 100 mg by mouth at bedtime.     Yes [provider]  ALPRAZolam Duanne Moron) 0.5 MG tablet Take one tablet 30 min. prior to MRI.  Take 2nd tablet to appt. with you and take it if needed.  You must not drink alcohol, drive, or operate dangerous equipment after taking this medication. 10/01/16   Sater, Nanine Means, MD  baclofen (LIORESAL) 10 MG tablet 1 tab twice daily, and 2 tablets before bedtime Patient not taking: Reported on 10/23/2016 09/30/16   Sater, Nanine Means, MD  ENSURE (ENSURE) Take 237 mLs by mouth 2 (two) times daily as needed (nausea).     [provider]  polyethylene glycol powder (GLYCOLAX/MIRALAX) powder Take 17 g by mouth daily as needed for moderate constipation.  03/08/15   [provider]  sodium chloride (OCEAN) 0.65 % SOLN nasal spray Place 1 spray into both nostrils as needed for congestion.    [provider]  tiZANidine (ZANAFLEX) 4 MG tablet Take 4 mg by mouth every 8 (eight) hours as needed for muscle spasms.    [provider]    Family History Family  History  Problem Relation Age of Onset  . Lung cancer Mother   . Emphysema Father   . Heart disease Unknown     Social History Social History  Substance Use Topics  . Smoking status: Former Smoker    Packs/day: 2.00    Quit date: 04/08/1996  . Smokeless tobacco: Never Used     Comment: ALCOHOL IN PAST  . Alcohol use No     Allergies   Other   Review of Systems Review of Systems  Genitourinary: Positive for frequency.  Neurological: Negative for dizziness, light-headedness and headaches.     Physical Exam Updated Vital Signs BP (!) 144/89 (BP Location: Left Arm)   Pulse 66  Temp 98.7 F (37.1 C) (Oral)   Resp 18   Ht 5' 1.5" (1.562 m)   Wt 74.4 kg (164 lb)   SpO2 95%   BMI 30.49 kg/m   Physical Exam  Constitutional: She is oriented to person, place, and time. She appears well-developed and well-nourished.  HENT:  Head: Normocephalic and atraumatic.  Marland Kitchen Hematoma to the anterior forehead.  Eyes: Right eye exhibits no discharge.  Cardiovascular: Normal rate, regular rhythm and normal heart sounds.   No murmur heard. Pulmonary/Chest: Effort normal and breath sounds normal. She has no wheezes. She has no rales.  Abdominal: Soft. She exhibits no distension. There is no tenderness.  Neurological: She is alert and oriented to person, place, and time.  Skin: Skin is warm and dry. She is not diaphoretic.  Psychiatric: She has a normal mood and affect.  Nursing note and vitals reviewed.    ED Treatments / Results  Labs (all labs ordered are listed, but only abnormal results are displayed) Labs Reviewed  URINALYSIS, ROUTINE W REFLEX MICROSCOPIC - Abnormal; Notable for the following:       Result Value   Specific Gravity, Urine 1.031 (*)    Bilirubin Urine SMALL (*)    Leukocytes, UA SMALL (*)    Bacteria, UA RARE (*)    Squamous Epithelial / LPF 0-5 (*)    All other components within normal limits    EKG  EKG Interpretation None       Radiology Ct  Head Wo Contrast  Result Date: 10/23/2016 CLINICAL DATA:  Patient fell and struck head on floor. EXAM: CT HEAD WITHOUT CONTRAST CT CERVICAL SPINE WITHOUT CONTRAST TECHNIQUE: Multidetector CT imaging of the head and cervical spine was performed following the standard protocol without intravenous contrast. Multiplanar CT image reconstructions of the cervical spine were also generated. COMPARISON:  Brain MRI 07/25/2013.  Head CT 05/22/2008. FINDINGS: CT HEAD FINDINGS Brain: There is no evidence for acute hemorrhage, hydrocephalus, mass lesion, or abnormal extra-axial fluid collection. No definite CT evidence for acute infarction. Diffuse loss of parenchymal volume is consistent with atrophy. Patchy low attenuation in the deep hemispheric and periventricular white matter is nonspecific, but likely reflects chronic microvascular ischemic demyelination. Vascular: No hyperdense vessel or unexpected calcification. Skull: No evidence for fracture. No worrisome lytic or sclerotic lesion. Sinuses/Orbits: The visualized paranasal sinuses and mastoid air cells are clear. Visualized portions of the globes and intraorbital fat are unremarkable. Other: Right frontal scalp contusion evident. CT CERVICAL SPINE FINDINGS Alignment: Straightening of normal cervical lordosis. No subluxation. Skull base and vertebrae: No acute fracture. No primary bone lesion or focal pathologic process. Soft tissues and spinal canal: No prevertebral fluid or swelling. No visible canal hematoma. Disc levels:  Loss of disc height noted C6-7. Upper chest: Negative. Other: None. IMPRESSION: 1. No acute intracranial abnormality. Right frontal scalp contusion noted. 2. Atrophy with chronic small vessel white matter ischemic disease. 3. No evidence for cervical spine fracture. 4. Loss of cervical lordosis. This can be related to patient positioning, muscle spasm or soft tissue injury. Electronically Signed   By: Misty Stanley M.D.   On: 10/23/2016 20:15   Ct  Cervical Spine Wo Contrast  Result Date: 10/23/2016 CLINICAL DATA:  Patient fell and struck head on floor. EXAM: CT HEAD WITHOUT CONTRAST CT CERVICAL SPINE WITHOUT CONTRAST TECHNIQUE: Multidetector CT imaging of the head and cervical spine was performed following the standard protocol without intravenous contrast. Multiplanar CT image reconstructions of the cervical spine were also  generated. COMPARISON:  Brain MRI 07/25/2013.  Head CT 05/22/2008. FINDINGS: CT HEAD FINDINGS Brain: There is no evidence for acute hemorrhage, hydrocephalus, mass lesion, or abnormal extra-axial fluid collection. No definite CT evidence for acute infarction. Diffuse loss of parenchymal volume is consistent with atrophy. Patchy low attenuation in the deep hemispheric and periventricular white matter is nonspecific, but likely reflects chronic microvascular ischemic demyelination. Vascular: No hyperdense vessel or unexpected calcification. Skull: No evidence for fracture. No worrisome lytic or sclerotic lesion. Sinuses/Orbits: The visualized paranasal sinuses and mastoid air cells are clear. Visualized portions of the globes and intraorbital fat are unremarkable. Other: Right frontal scalp contusion evident. CT CERVICAL SPINE FINDINGS Alignment: Straightening of normal cervical lordosis. No subluxation. Skull base and vertebrae: No acute fracture. No primary bone lesion or focal pathologic process. Soft tissues and spinal canal: No prevertebral fluid or swelling. No visible canal hematoma. Disc levels:  Loss of disc height noted C6-7. Upper chest: Negative. Other: None. IMPRESSION: 1. No acute intracranial abnormality. Right frontal scalp contusion noted. 2. Atrophy with chronic small vessel white matter ischemic disease. 3. No evidence for cervical spine fracture. 4. Loss of cervical lordosis. This can be related to patient positioning, muscle spasm or soft tissue injury. Electronically Signed   By: Misty Stanley M.D.   On: 10/23/2016  20:15    Procedures Procedures (including critical care time)  Medications Ordered in ED Medications  cephALEXin (KEFLEX) capsule 500 mg (not administered)     Initial Impression / Assessment and Plan / ED Course  I have reviewed the triage vital signs and the nursing notes.  Pertinent labs & imaging results that were available during my care of the patient were reviewed by me and considered in my medical decision making (see chart for details).     Well appearing 61 year old female presenting after mechanical fall. Large hematoma. We'll do CT head and neck. Patient's been having urinary frequency on review systems we'll send urine.  9:11 PM urne likely UTI. Will send cutlure, CT normal. Pt ambulating at baseline.   Final Clinical Impressions(s) / ED Diagnoses   Final diagnoses:  None    New Prescriptions New Prescriptions   No medications on file     Macarthur Critchley, MD 10/23/16 2111

## 2016-10-26 LAB — URINE CULTURE: Culture: <10000 — AB

## 2016-10-27 ENCOUNTER — Other Ambulatory Visit: Payer: Medicare Other

## 2016-10-28 ENCOUNTER — Ambulatory Visit
Admission: RE | Admit: 2016-10-28 | Discharge: 2016-10-28 | Disposition: A | Discharge status: Home / Self Care | Payer: Medicare Other | Source: Ambulatory Visit | Attending: Neurology | Admitting: Neurology

## 2016-10-28 DIAGNOSIS — R269 Unspecified abnormalities of gait and mobility: Secondary | ICD-10-CM

## 2016-10-28 DIAGNOSIS — G35 Multiple sclerosis: Secondary | ICD-10-CM

## 2016-10-28 MED ORDER — GADOBENATE DIMEGLUMINE 529 MG/ML IV SOLN
15.0000 mL | Freq: Once | INTRAVENOUS | Status: AC | PRN
  Administered 2016-10-28: 12 mL via INTRAVENOUS

## 2016-10-29 ENCOUNTER — Telehealth: Payer: Self-pay

## 2016-10-29 NOTE — Telephone Encounter (Signed)
LEft vm for patient to call back about MRI results.

## 2016-10-29 NOTE — Telephone Encounter (Signed)
Patient returning your call regarding MRI results.

## 2016-10-30 ENCOUNTER — Telehealth: Payer: Self-pay | Admitting: *Deleted

## 2016-10-30 NOTE — Telephone Encounter (Signed)
error 

## 2016-10-30 NOTE — Telephone Encounter (Signed)
Rn call patient that the MRI brain shows no new lesions. It shows a knot on the right forehead from the fall Pt verbalized understanding. ------

## 2016-10-30 NOTE — Telephone Encounter (Signed)
Pt returned RN's call °

## 2016-11-05 ENCOUNTER — Telehealth: Payer: Self-pay | Admitting: *Deleted

## 2016-11-05 ENCOUNTER — Telehealth: Payer: Self-pay | Admitting: Neurology

## 2016-11-05 ENCOUNTER — Other Ambulatory Visit: Payer: Self-pay | Admitting: Neurology

## 2016-11-05 DIAGNOSIS — G35 Multiple sclerosis: Secondary | ICD-10-CM

## 2016-11-05 NOTE — Telephone Encounter (Signed)
I have spoken with [name] this afternoon and advised ok to take less Gabapentin due to drowsiness/fim

## 2016-11-05 NOTE — Telephone Encounter (Signed)
Pt called the clinic said gabapentin (NEURONTIN) 100 MG capsule makes her very sleepy if she takes it as directed. She takes 1 tab am and 1 midday and 300mg  nighttime. Pt wants confirmation this ok. Please call and discuss

## 2016-11-05 NOTE — Telephone Encounter (Signed)
-----   Message from Britt Bottom, MD sent at 10/28/2016  7:40 PM EDT ----- Please let her know that the MRI of the brain does not show any new MS lesions. It does show a "knot" on the right forehead probably from her recent fall.

## 2016-11-05 NOTE — Telephone Encounter (Signed)
I have spoken with Freada today and per [facility], reviewed MRI results as below. She verbalized understanding of same/fim

## 2017-01-14 ENCOUNTER — Other Ambulatory Visit: Payer: Self-pay | Admitting: Neurology

## 2017-02-25 ENCOUNTER — Telehealth: Payer: Self-pay | Admitting: Neurology

## 2017-02-25 NOTE — Telephone Encounter (Signed)
I have spoken with [name] this afternoon and given her the [facility] phone #.  This would be the best # for her to call for troubleshooting (if the problem is with Betaseron injections) in between appt's with [facility].  They have nurses who only deal with Betaseron patients, and so would be better equipped to help her.  She verbalized understanding of same/fim

## 2017-02-25 NOTE — Telephone Encounter (Signed)
Patient calling to discuss BETASERON 0.3 MG KIT injection.  She has been getting bruising whenever she gives injection. °

## 2017-03-19 ENCOUNTER — Other Ambulatory Visit: Payer: Self-pay | Admitting: Obstetrics and Gynecology

## 2017-03-19 DIAGNOSIS — Z1231 Encounter for screening mammogram for malignant neoplasm of breast: Secondary | ICD-10-CM

## 2017-04-02 ENCOUNTER — Other Ambulatory Visit: Payer: Self-pay

## 2017-04-02 ENCOUNTER — Ambulatory Visit (INDEPENDENT_AMBULATORY_CARE_PROVIDER_SITE_OTHER): Payer: Medicare Other | Admitting: Neurology

## 2017-04-02 ENCOUNTER — Encounter: Payer: Self-pay | Admitting: Neurology

## 2017-04-02 ENCOUNTER — Encounter (INDEPENDENT_AMBULATORY_CARE_PROVIDER_SITE_OTHER): Payer: Self-pay

## 2017-04-02 VITALS — BP 132/81 | HR 82 | Resp 18 | Ht 62.0 in | Wt 170.5 lb

## 2017-04-02 DIAGNOSIS — R269 Unspecified abnormalities of gait and mobility: Secondary | ICD-10-CM

## 2017-04-02 DIAGNOSIS — R3915 Urgency of urination: Secondary | ICD-10-CM | POA: Diagnosis not present

## 2017-04-02 DIAGNOSIS — R5383 Other fatigue: Secondary | ICD-10-CM | POA: Diagnosis not present

## 2017-04-02 DIAGNOSIS — G43009 Migraine without aura, not intractable, without status migrainosus: Secondary | ICD-10-CM

## 2017-04-02 DIAGNOSIS — G35 Multiple sclerosis: Secondary | ICD-10-CM | POA: Diagnosis not present

## 2017-04-02 MED ORDER — BACLOFEN 10 MG PO TABS: ORAL_TABLET | ORAL | 5 refills | Status: DC

## 2017-04-02 NOTE — Patient Instructions (Signed)
Take baclofen 10 mg in the morning, 20 mg later in the day and 20 mg at night.  Take gabapentin 1 pill in the morning, 1 pill later in the day and 2 pills at night  Your medicine list states that you are on duloxetine (Cymbalta).  Please let us know if you're still taking this and if so if you need a refill.

## 2017-04-02 NOTE — Progress Notes (Signed)
[facility]  PATIENT: [name] DOB: 09-May-1955  REFERRING DOCTOR OR PCP:  Dr. Leta Baptist  SOURCE: patient, MRI images on PACS, imaging reports, notes in EPIC and labs in EPIC  _________________________________   HISTORICAL  CHIEF COMPLAINT:  Chief Complaint  Patient presents with  . Multiple Sclerosis    Sts. she continues to tolerate Betaseron well.  Taking Baclofen 36m tid, sts. it is causing some daytime drowsiness,would like to go back to Baclofen 01-01-19.  C/O increased neck pain, esp. at night.  Pain better with resting her head to the side./fim    HISTORY OF PRESENT ILLNESS:  CSumer Moorehouseis a 62year old woman with relapsing remitting multiple sclerosis.      Update 04/02/2017: Mrs. LLeopardcontinues on Betaseron. She is tolerating the injections well. She has not had any recent exacerbations.   The MRIs of the brain 10/28/2016 showed extensive lesions in the brainstem, cerebellum and the hemispheres consistent with MS. There were no acute/enhancing lesions. When compared to the MRI from 2015, there were no new lesions.  She had an URI last month followed by a GI virus.   She had diarrhea and did have one episode of fecal incontinence.   She continues to have some difficulties with her gait due to poor balance. She uses a cane or walker depending on how far she is going. Spasticity in the legs is helped by baclofen by baclofen sometimes makes her sleepy.   Current dose is 20-20-20.      She has dysesthesias (pinprivk sensation) all over at times and she has a warm sensation in her calf at night.    She is unsure if she is still on duloxetine.   She has urinary frequency and urgency.   Vision is ok but she notes colors are washed out out of her left eye.    Mood is doing well and she denies current depression or anxiety.     She notes mild cognitive issues but does well with writing lists and is independent.   She balances her checkbook.   Sleep is ok with  trazodone.    She has some fatigue.     Her headaches are doing well and they occur less frequently.        From 09/30/2016: She is experiencing daily headache  MS:   She is currently on Betaseron. She was She tolerates the injections well. She denies any exacerbation and last MRI did not show any new changes.  HA/neck pain:   She is having fewer HA's and they are no longer daily.    She continues to have neck pain. Certain movement of her neck increase the pain.     Tylenol has not helped. Ice helps a little bit.  She has had headaches like this in the past and Fioricet has helped some.   A splenius capitus/occipitla nerve injection only helped a little bit.    Gait/strength/sensation:    She feels gait is the same.   Gait issues are mostly due to poor balance.    She uses a cane for short walks and a walker for longer distances.  She has muscle spasticity in legs, helped by baclofen.     Dysesthesias are helped by gabapentin but she takes just 300 mg nightly.   She does not note any significant numbness weakness or clumsiness in the arms.  Bladder/bowel: She has urinary frequency and urgency.She has incontinence at times and needs to use a pad.  She has 1 x nocturia.    She has constipation.  Vision:   She had optic neuritis in the past but has no sequela of it and her ophthalmologist told her that the eye look good.   She wears glasses.    Fatigue/sleep: She reports some fatigue and also notes more sleepiness tha at the last visit.  Pysical fatoigue is worse when she is more active.  She has difficulty with sleep maintenance insomnia more than with sleep onset. She snores and has rarely woken up gasping for air.  Mood/cognition: She denies any depression or anxiety.  She notes minimal cognitive difficulty. Specifically, she will sometimes have trouble coming up with the right words she feels the cognitive function is stable.  MS History:    In 1983, while at work, she was having difficulty  with her gait and was unsteady and falling. She R doctor and was referred to Dr. Morrell Riddle of [facility] neurology who diagnosed her with multiple sclerosis. She was in the Betaseron drug study (was on placebo) with Dr. Terie Purser in [place]. When Betaseron became available in 1992 or 1993 she started the medication and was seen Dr. Erling Cruz. Over the next few years she had a couple exacerbations that involved leg function, gait or balance. She had some treatments with IV steroids. She also has had some slow worsening with her gait more recently. Her last MRI of the brain and MRI of the cervical spine was performed in 2015. There were no acute lesions but there was a fair plaque burden in the brain and several foci in the cervical spine.   EPWORTH SLEEPINESS SCALE  On a scale of 0 - 3 what is the chance of dozing:  Sitting and Reading:   3 Watching TV:    3 Sitting inactive in a public place: 2 Passenger in car for one hour: 0 Lying down to rest in the afternoon: 3 Sitting and talking to someone: 0 Sitting quietly after lunch:  3 In a car, stopped in traffic:  0  Total (out of 24):    14/24   MRI Review:   I personally reviewed the MRI of the brain and cervical spine from 07/25/2013. In the brain, there are extensive T2/FLAIR hyperintense foci involving the cerebellum, left posterior medulla, there was a normal enhancement pattern. The cervical spine showed hyperintense foci adjacent to C3, C5-C6, C6, and T1. Right posterior pons, bilateral hemispheres, predominantly in the periventricular cortical white matter.   None of these enhanced.   REVIEW OF SYSTEMS: Constitutional: No fevers, chills, sweats, or change in appetite.  Fatigue and sleepiness.  She has maintenance insomnia Eyes: No visual changes, double vision, eye pain.   Some fatigue. She has insomnia helped by trazodone. Ear, nose and throat: No hearing loss, ear pain, nasal congestion, sore throat Cardiovascular: No chest pain,  palpitations Respiratory: No shortness of breath at rest or with exertion.   No wheezes.   She snores GastrointestinaI: No nausea, vomiting, diarrhea, abdominal pain, fecal incontinence Genitourinary: as above Musculoskeletal: No neck pain, back pain Integumentary: No rash, pruritus, skin lesions Neurological: as above Psychiatric: No depression at this time.  No anxiety Endocrine: No palpitations, diaphoresis, change in appetite, change in weigh or increased thirst Hematologic/Lymphatic: No anemia, purpura, petechiae. Allergic/Immunologic: No itchy/runny eyes, nasal congestion, recent allergic reactions, rashes  ALLERGIES: Allergies  Allergen Reactions  . Other Other (See Comments)    Pt states she took a medication 2-3 yrs ago that may have been for  inflammation and it caused her to be weak.    HOME MEDICATIONS:  Current Outpatient Medications:  .  ALPRAZolam (XANAX) 0.5 MG tablet, Take one tablet 30 min. prior to MRI.  Take 2nd tablet to appt. with you and take it if needed.  You must not drink alcohol, drive, or operate dangerous equipment after taking this medication., Disp: 2 tablet, Rfl: 0 .  amLODipine (NORVASC) 10 MG tablet, Take 10 mg by mouth daily.  , Disp: , Rfl:  .  atorvastatin (LIPITOR) 20 MG tablet, Take 20 mg by mouth daily.  , Disp: , Rfl:  .  BETASERON 0.3 MG KIT injection, RECONSTITUTE WITH 1.2 ML DILUENT AND INJECT 1 ML OR 0.25MG UNDER THE SKIN (SUBCUTANEOUS INJECTION) EVERY OTHER DAY, Disp: 42 each, Rfl: 3 .  butalbital-acetaminophen-caffeine (FIORICET, ESGIC) 50-325-40 MG tablet, Take 1 tablet by mouth every 6 (six) hours as needed for headache. Must last a month or more before refill, Disp: 30 tablet, Rfl: 3 .  Calcium Citrate-Vitamin D (CALCIUM + D PO), Take 1 capsule by mouth daily., Disp: , Rfl:  .  DULoxetine (CYMBALTA) 60 MG capsule, Take 1 capsule by mouth daily., Disp: , Rfl:  .  ENSURE (ENSURE), Take 237 mLs by mouth 2 (two) times daily as needed  (nausea). , Disp: , Rfl:  .  fluticasone (FLONASE) 50 MCG/ACT nasal spray, Place 2 sprays into the nose daily. For stuffy nose. , Disp: , Rfl:  .  gabapentin (NEURONTIN) 300 MG capsule, TAKE 1 CAPSULE BY MOUTH EVERY MORNING, 1 CAPSULE EVERY AFTERNOON, AND 2 CAPSULES AT BEDTIME. (FOR NERVE PAIN/NEUROPATHY), Disp: 120 capsule, Rfl: 5 .  ibuprofen (ADVIL,MOTRIN) 600 MG tablet, Take 1 tablet by mouth every 8 (eight) hours as needed for mild pain or moderate pain. , Disp: , Rfl:  .  lisinopril-hydrochlorothiazide (PRINZIDE,ZESTORETIC) 20-25 MG per tablet, Take 1 tablet by mouth daily.  , Disp: , Rfl:  .  montelukast (SINGULAIR) 10 MG tablet, Take 10 mg by mouth at bedtime.  , Disp: , Rfl:  .  Multiple Vitamins-Minerals (MULTIVITAMIN WITH MINERALS) tablet, Take 1 tablet by mouth daily., Disp: , Rfl:  .  NEXIUM 40 MG capsule, Take 40 mg by mouth daily., Disp: , Rfl:  .  omega-3 acid ethyl esters (LOVAZA) 1 g capsule, Take 1 g by mouth daily., Disp: , Rfl:  .  Polyethyl Glycol-Propyl Glycol (SYSTANE ULTRA OP), Apply 1 drop to eye daily as needed (dry eyes). , Disp: , Rfl:  .  polyethylene glycol powder (GLYCOLAX/MIRALAX) powder, Take 17 g by mouth daily as needed for moderate constipation. , Disp: , Rfl: 12 .  potassium chloride (K-DUR) 10 MEQ tablet, Take 10 mEq by mouth daily., Disp: , Rfl:  .  ranitidine (ZANTAC) 150 MG capsule, Take 150 mg by mouth every evening., Disp: , Rfl:  .  sodium chloride (OCEAN) 0.65 % SOLN nasal spray, Place 1 spray into both nostrils as needed for congestion., Disp: , Rfl:  .  traMADol (ULTRAM) 50 MG tablet, Take 50 mg by mouth every 8 (eight) hours as needed for severe pain. , Disp: , Rfl:  .  traZODone (DESYREL) 100 MG tablet, Take 100 mg by mouth at bedtime.  , Disp: , Rfl:  .  baclofen (LIORESAL) 10 MG tablet, 1 tab twice daily, and 2 tablets before bedtime (Patient not taking: Reported on 10/23/2016), Disp: 120 each, Rfl: 11 .  baclofen (LIORESAL) 10 MG tablet, Take up to  3 times a day, Disp: 90  each, Rfl: 5 .  baclofen (LIORESAL) 20 MG tablet, Take 20-40 mg by mouth 3 (three) times daily. Take 1 tablet in the am, lunch and Take 2 tablets in the evening., Disp: , Rfl: 5 .  cephALEXin (KEFLEX) 500 MG capsule, Take 1 capsule (500 mg total) by mouth 4 (four) times daily., Disp: 20 capsule, Rfl: 0  PAST MEDICAL HISTORY: Past Medical History:  Diagnosis Date  . Anemia    HX  . Anxiety   . Arthritis   . Chronic kidney disease    UTI'S  . Complication of anesthesia    BP RISES WHEN AWAKENING  . Complication of anesthesia    "difficult waking up and can become combative"   . GERD (gastroesophageal reflux disease)   . H/O hiatal hernia   . Headache(784.0)   . Heart murmur   . Hypertension   . Lumbar spinal stenosis   . Lumbar spondylolysis   . MS (multiple sclerosis) ([place])   . Neuromuscular disorder ([place])    MS  . Urinary frequency   . Wears glasses     PAST SURGICAL HISTORY: Past Surgical History:  Procedure Laterality Date  . CHOLECYSTECTOMY    . EYE SURGERY     BLEPHAROPLASTY  . LUMBAR FUSION  05/10/2015  . POSTERIOR FUSION LUMBAR SPINE  04/16/11   L4-5  . TRANSFORAMINAL LUMBAR INTERBODY FUSION (TLIF) WITH PEDICLE SCREW FIXATION 1 LEVEL N/A 05/10/2015   Procedure: TRANSFORAMINAL LUMBAR INTERBODY FUSION (TLIF) WITH PEDICLE SCREW FIXATION L3-4;  Surgeon: Eustace Moore, MD;  Location: [facility];  Service: Neurosurgery;  Laterality: N/A;  TRANSFORAMINAL LUMBAR INTERBODY FUSION (TLIF) WITH PEDICLE SCREW FIXATION L3-4  . UTERINE FIBROID EMBOLIZATION      FAMILY HISTORY: Family History  Problem Relation Age of Onset  . Lung cancer Mother   . Emphysema Father   . Heart disease Unknown     SOCIAL HISTORY:  Social History   Socioeconomic History  . Marital status: Single    Spouse name: Not on file  . Number of children: 0  . Years of education: 57  . Highest education level: Not on file  Social Needs  . Financial resource strain: Not  on file  . Food insecurity - worry: Not on file  . Food insecurity - inability: Not on file  . Transportation needs - medical: Not on file  . Transportation needs - non-medical: Not on file  Occupational History    Comment: Does not work - Disabled  Tobacco Use  . Smoking status: Former Smoker    Packs/day: 2.00    Last attempt to quit: 04/08/1996    Years since quitting: 20.9  . Smokeless tobacco: Never Used  . Tobacco comment: ALCOHOL IN PAST  Substance and Sexual Activity  . Alcohol use: No  . Drug use: No  . Sexual activity: Yes    Birth control/protection: Post-menopausal  Other Topics Concern  . Not on file  Social History Narrative   Patient does not work she is disabled. and lives at home. Patient is single.   Caffeine: 1-2 cups daily depending on the day   Right handed.     PHYSICAL EXAM  Vitals:   04/02/17 0938  BP: 132/81  Pulse: 82  Resp: 18  Weight: 170 lb 8 oz (77.3 kg)  Height: 5' 2"  (1.575 m)    Body mass index is 31.18 kg/m.   General: The patient is well-developed and well-nourished and in no acute distress  Neck: Mild  occipital tenderness. Range of motion neck is reduced.   Skin: Extremities are without rash or edema.  Neurologic Exam  Mental status: The patient is alert and oriented x 3 at the time of the examination. The patient has apparent normal recent and remote memory, with an apparently normal attention span and concentration ability.   Speech is normal.  Cranial nerves: Extraocular movements are full. Facial strength and sensation is normal. Trapezius strength is normal.. No obvious hearing deficits are noted.  Motor:  Muscle bulk is normal.   Tone is increased in legs. Strength was 5/5.  Sensory: She has normal touch and vibration sensation in the arms and legs..  Coordination: She has good finger-nose-finger bilaterally.  Gait and station: Station is normal.   The gait is mildly wide and she walked with her walker.. She has  slight spasticity with her walking. She cannot tandem walk.  Romberg is negative.   Reflexes: Deep tendon reflexes are symmetric and normal bilaterally.       DIAGNOSTIC DATA (LABS, IMAGING, TESTING) - I reviewed patient records, labs, notes, testing and imaging myself where available.  Lab Results  Component Value Date   WBC 3.8 09/30/2016   HGB 12.4 09/30/2016   HCT 37.9 09/30/2016   MCV 87 09/30/2016   PLT 225 09/30/2016      Component Value Date/Time   NA 142 09/30/2016 0859   K 3.7 09/30/2016 0859   CL 99 09/30/2016 0859   CO2 30 (H) 09/30/2016 0859   GLUCOSE 109 (H) 09/30/2016 0859   GLUCOSE 85 05/02/2015 0945   BUN 15 09/30/2016 0859   CREATININE 0.75 09/30/2016 0859   CALCIUM 10.1 09/30/2016 0859   PROT 6.9 09/30/2016 0859   ALBUMIN 4.8 09/30/2016 0859   AST 20 09/30/2016 0859   ALT 31 09/30/2016 0859   ALKPHOS 107 09/30/2016 0859   BILITOT 0.3 09/30/2016 0859   GFRNONAA 86 09/30/2016 0859   GFRAA 99 09/30/2016 0859   No results found for: CHOL, HDL, LDLCALC, LDLDIRECT, TRIG, CHOLHDL Lab Results  Component Value Date   HGBA1C 5.7 (H) 05/02/2015      ASSESSMENT AND PLAN  Multiple sclerosis (HCC)  Migraine without aura and without status migrainosus, not intractable  Gait disturbance  Other fatigue  Urinary urgency  1.  Changes baclofen to 01-01-19 from 20 mg 3 times a day. A prescription was given for the 10 mg so that she does not have to worry about cutting pills   2.   She will remain off of duloxetine.   However tingling worsens we may want to get her back on 3,   Continue Betaseron but reduce dose to twice a week.   Later in the year we will check a brain MRI to make sure that there is no subclinical progression. If subclinical progression is present, I would recommend a switch in disease modifying therapies. 4,   She will return in 6 months or sooner if there are new or worsening neurologic symptoms.   Richard A. Felecia Shelling, MD, PhD 8/59/2924,  46:28 AM Certified in Neurology, Clinical Neurophysiology, Sleep Medicine, Pain Medicine and Neuroimaging  [facility] [address], [place], [place] [address] [phone]

## 2017-04-24 ENCOUNTER — Ambulatory Visit: Payer: Medicare Other

## 2017-05-04 ENCOUNTER — Telehealth: Payer: Self-pay | Admitting: Neurology

## 2017-05-04 NOTE — Telephone Encounter (Signed)
Since Pt was told she could increase her shots(BETASERON 0.3 MG KIT injection) she states her legs feel wobbly and would like to go back to every other day.  Pt is asking for a call back to discuss °

## 2017-05-04 NOTE — Telephone Encounter (Signed)
Spoke with [employer]. She sts. that since decreasing Betaseron to twice per wk., as discussed at last ov, her legs feel more "wobbly," and she "just feels nervous" about that decision, so is going to go back to inj. QOD.  Will pass update along to RAS/fim

## 2017-05-06 NOTE — Telephone Encounter (Signed)
If she wants to go back to every other day that's fine.

## 2017-05-07 NOTE — Telephone Encounter (Signed)
Noted. Pt. aware and has gone back to QOD inj/fim

## 2017-05-13 ENCOUNTER — Ambulatory Visit: Payer: Medicare Other

## 2017-05-27 ENCOUNTER — Ambulatory Visit: Payer: Medicare Other

## 2017-05-27 ENCOUNTER — Other Ambulatory Visit: Payer: Self-pay | Admitting: Neurology

## 2017-05-29 ENCOUNTER — Other Ambulatory Visit: Payer: Self-pay | Admitting: Neurology

## 2017-06-01 ENCOUNTER — Other Ambulatory Visit: Payer: Self-pay | Admitting: Neurology

## 2017-06-01 MED ORDER — GABAPENTIN 300 MG PO CAPS: ORAL_CAPSULE | ORAL | 5 refills | Status: DC

## 2017-06-19 ENCOUNTER — Ambulatory Visit: Payer: Medicare Other

## 2017-07-08 ENCOUNTER — Ambulatory Visit: Payer: Medicare Other

## 2017-07-27 ENCOUNTER — Other Ambulatory Visit: Payer: Self-pay | Admitting: Family Medicine

## 2017-07-27 DIAGNOSIS — Z1231 Encounter for screening mammogram for malignant neoplasm of breast: Secondary | ICD-10-CM

## 2017-07-28 ENCOUNTER — Ambulatory Visit: Payer: Medicare Other

## 2017-08-19 ENCOUNTER — Ambulatory Visit
Admission: RE | Admit: 2017-08-19 | Discharge: 2017-08-19 | Disposition: A | Discharge status: Home / Self Care | Payer: Medicare Other | Source: Ambulatory Visit | Attending: Family Medicine | Admitting: Family Medicine

## 2017-08-19 DIAGNOSIS — Z1231 Encounter for screening mammogram for malignant neoplasm of breast: Secondary | ICD-10-CM

## 2017-08-25 ENCOUNTER — Other Ambulatory Visit: Payer: Self-pay | Admitting: Neurology

## 2017-09-30 ENCOUNTER — Ambulatory Visit (INDEPENDENT_AMBULATORY_CARE_PROVIDER_SITE_OTHER): Payer: Medicare Other | Admitting: Neurology

## 2017-09-30 ENCOUNTER — Encounter: Payer: Self-pay | Admitting: Neurology

## 2017-09-30 VITALS — BP 124/80 | HR 77 | Resp 18 | Ht 62.0 in | Wt 173.0 lb

## 2017-09-30 DIAGNOSIS — R5383 Other fatigue: Secondary | ICD-10-CM | POA: Diagnosis not present

## 2017-09-30 DIAGNOSIS — G35 Multiple sclerosis: Secondary | ICD-10-CM

## 2017-09-30 DIAGNOSIS — R269 Unspecified abnormalities of gait and mobility: Secondary | ICD-10-CM

## 2017-09-30 DIAGNOSIS — H04123 Dry eye syndrome of bilateral lacrimal glands: Secondary | ICD-10-CM

## 2017-09-30 DIAGNOSIS — R3915 Urgency of urination: Secondary | ICD-10-CM | POA: Diagnosis not present

## 2017-09-30 MED ORDER — DULOXETINE HCL 20 MG PO CPEP: ORAL_CAPSULE | ORAL | 0 refills | Status: DC

## 2017-09-30 NOTE — Progress Notes (Signed)
[facility]  PATIENT: [name] DOB: Oct 16, 1955  REFERRING DOCTOR OR PCP:  Dr. Leta Baptist  SOURCE: patient, MRI images on PACS, imaging reports, notes in EPIC and labs in EPIC  _________________________________   HISTORICAL  CHIEF COMPLAINT:  Chief Complaint  Patient presents with  . Multiple Sclerosis    Sts. she has increased Betaseron back to QOD (was decreased to 2 times per wk, but she c/o increased weakness with this).  Sts. she is doing well with decreased Baclofen dose of 01-01-19.  Would like to discuss new c/o of intermittent twitching left eyelid/fim    HISTORY OF PRESENT ILLNESS:  [name] is a 62 year old woman with relapsing remitting multiple sclerosis.     Update 09/30/2017: She feels her MS has been stable and denies any new symptom or exacerbation.  Her gait is stable.    She uses a walker more for balance than strength. She also uses a cane she furniture surfs or uses a cane.     With her walker, she can go a couple hundred feet.   Arm and and strength is good.   She has some spasticity helped by baclofen 01-01-39.     She has no change in sensation.   She has urinary freq/urgency and has some incontinence so she wears a pad.     Her left eye twitches intermittently.  Sometimes she feels her eyes are itchy before the twitching starts. She also feels the eyes are dry at times.  The twitching is only below the eye.    There is no nystagmus or ptosis.     Mood is doing very well.    She has been on Cymbalta many years and we discussed stopping it.    Update 04/02/2017: [name] continues on Betaseron. She is tolerating the injections well. She has not had any recent exacerbations.   The MRIs of the brain 10/28/2016 showed extensive lesions in the brainstem, cerebellum and the hemispheres consistent with MS. There were no acute/enhancing lesions. When compared to the MRI from 2015, there were no new lesions.  She had an URI last month followed  by a GI virus.   She had diarrhea and did have one episode of fecal incontinence.   She continues to have some difficulties with her gait due to poor balance. She uses a cane or walker depending on how far she is going. Spasticity in the legs is helped by baclofen by baclofen sometimes makes her sleepy.   Current dose is 20-20-20.      She has dysesthesias (pinprivk sensation) all over at times and she has a warm sensation in her calf at night.    She is unsure if she is still on duloxetine.   She has urinary frequency and urgency.   Vision is ok but she notes colors are washed out out of her left eye.    Mood is doing well and she denies current depression or anxiety.     She notes mild cognitive issues but does well with writing lists and is independent.   She balances her checkbook.   Sleep is ok with trazodone.    She has some fatigue.     Her headaches are doing well and they occur less frequently.        From 09/30/2016: She is experiencing daily headache  MS:   She is currently on Betaseron. She was She tolerates the injections well. She denies any exacerbation and last MRI did  not show any new changes.  HA/neck pain:   She is having fewer HA's and they are no longer daily.    She continues to have neck pain. Certain movement of her neck increase the pain.     Tylenol has not helped. Ice helps a little bit.  She has had headaches like this in the past and Fioricet has helped some.   A splenius capitus/occipitla nerve injection only helped a little bit.    Gait/strength/sensation:    She feels gait is the same.   Gait issues are mostly due to poor balance.    She uses a cane for short walks and a walker for longer distances.  She has muscle spasticity in legs, helped by baclofen.     Dysesthesias are helped by gabapentin but she takes just 300 mg nightly.   She does not note any significant numbness weakness or clumsiness in the arms.  Bladder/bowel: She has urinary frequency and urgency.She  has incontinence at times and needs to use a pad.   She has 1 x nocturia.    She has constipation.  Vision:   She had optic neuritis in the past but has no sequela of it and her ophthalmologist told her that the eye look good.   She wears glasses.    Fatigue/sleep: She reports some fatigue and also notes more sleepiness tha at the last visit.  Pysical fatoigue is worse when she is more active.  She has difficulty with sleep maintenance insomnia more than with sleep onset. She snores and has rarely woken up gasping for air.  Mood/cognition: She denies any depression or anxiety.  She notes minimal cognitive difficulty. Specifically, she will sometimes have trouble coming up with the right words she feels the cognitive function is stable.  MS History:    In 1983, while at work, she was having difficulty with her gait and was unsteady and falling. She R doctor and was referred to Dr. Morrell Riddle of [facility] neurology who diagnosed her with multiple sclerosis. She was in the Betaseron drug study (was on placebo) with Dr. Terie Purser in [place]. When Betaseron became available in 1992 or 1993 she started the medication and was seen Dr. Erling Cruz. Over the next few years she had a couple exacerbations that involved leg function, gait or balance. She had some treatments with IV steroids. She also has had some slow worsening with her gait more recently. Her last MRI of the brain and MRI of the cervical spine was performed in 2015. There were no acute lesions but there was a fair plaque burden in the brain and several foci in the cervical spine.   EPWORTH SLEEPINESS SCALE  On a scale of 0 - 3 what is the chance of dozing:  Sitting and Reading:   3 Watching TV:    3 Sitting inactive in a public place: 2 Passenger in car for one hour: 0 Lying down to rest in the afternoon: 3 Sitting and talking to someone: 0 Sitting quietly after lunch:  3 In a car, stopped in traffic:  0  Total (out of 24):    14/24   MRI  Review:   I personally reviewed the MRI of the brain and cervical spine from 07/25/2013. In the brain, there are extensive T2/FLAIR hyperintense foci involving the cerebellum, left posterior medulla, there was a normal enhancement pattern. The cervical spine showed hyperintense foci adjacent to C3, C5-C6, C6, and T1. Right posterior pons, bilateral hemispheres, predominantly in the periventricular cortical  white matter.   None of these enhanced.   REVIEW OF SYSTEMS: Constitutional: No fevers, chills, sweats, or change in appetite.  Fatigue and sleepiness.  She has maintenance insomnia Eyes: No visual changes, double vision, eye pain.   Some fatigue. She has insomnia helped by trazodone. Ear, nose and throat: No hearing loss, ear pain, nasal congestion, sore throat Cardiovascular: No chest pain, palpitations Respiratory: No shortness of breath at rest or with exertion.   No wheezes.   She snores GastrointestinaI: No nausea, vomiting, diarrhea, abdominal pain, fecal incontinence Genitourinary: as above Musculoskeletal: No neck pain, back pain Integumentary: No rash, pruritus, skin lesions Neurological: as above Psychiatric: No depression at this time.  No anxiety Endocrine: No palpitations, diaphoresis, change in appetite, change in weigh or increased thirst Hematologic/Lymphatic: No anemia, purpura, petechiae. Allergic/Immunologic: No itchy/runny eyes, nasal congestion, recent allergic reactions, rashes  ALLERGIES: Allergies  Allergen Reactions  . Other Other (See Comments)    Pt states she took a medication 2-3 yrs ago that may have been for inflammation and it caused her to be weak.    HOME MEDICATIONS:  Current Outpatient Medications:  .  amLODipine (NORVASC) 10 MG tablet, Take 10 mg by mouth daily.  , Disp: , Rfl:  .  atorvastatin (LIPITOR) 20 MG tablet, Take 20 mg by mouth daily.  , Disp: , Rfl:  .  baclofen (LIORESAL) 10 MG tablet, 1 tab twice daily, and 2 tablets before  bedtime, Disp: 120 each, Rfl: 11 .  BETASERON 0.3 MG KIT injection, RECONSTITUTE WITH 1.2 ML DILUENT AND INJECT 1 ML OR 0.25MG UNDER THE SKIN (SUBCUTANEOUS INJECTION) EVERY OTHER DAY, Disp: 42 each, Rfl: 3 .  butalbital-acetaminophen-caffeine (FIORICET, ESGIC) 50-325-40 MG tablet, TAKE 1 TABLET BY MOUTH EVERY 6 HOURS AS NEEDED FOR HEADACHE. MUST LAST A MONTH OR MORE BEFORE REFILL., Disp: 30 tablet, Rfl: 3 .  Calcium Citrate-Vitamin D (CALCIUM + D PO), Take 1 capsule by mouth daily., Disp: , Rfl:  .  ENSURE (ENSURE), Take 237 mLs by mouth 2 (two) times daily as needed (nausea). , Disp: , Rfl:  .  fluticasone (FLONASE) 50 MCG/ACT nasal spray, Place 2 sprays into the nose daily. For stuffy nose. , Disp: , Rfl:  .  gabapentin (NEURONTIN) 300 MG capsule, TAKE 1 CAPSULE BY MOUTH EVERY MORNING, 1 CAPSULE EVERY AFTERNOON, AND 2 CAPSULES AT BEDTIME. (FOR NERVE PAIN/NEUROPATHY), Disp: 120 capsule, Rfl: 5 .  ibuprofen (ADVIL,MOTRIN) 600 MG tablet, Take 1 tablet by mouth every 8 (eight) hours as needed for mild pain or moderate pain. , Disp: , Rfl:  .  lisinopril-hydrochlorothiazide (PRINZIDE,ZESTORETIC) 20-25 MG per tablet, Take 1 tablet by mouth daily.  , Disp: , Rfl:  .  montelukast (SINGULAIR) 10 MG tablet, Take 10 mg by mouth at bedtime.  , Disp: , Rfl:  .  Multiple Vitamins-Minerals (MULTIVITAMIN WITH MINERALS) tablet, Take 1 tablet by mouth daily., Disp: , Rfl:  .  NEXIUM 40 MG capsule, Take 40 mg by mouth daily., Disp: , Rfl:  .  omega-3 acid ethyl esters (LOVAZA) 1 g capsule, Take 1 g by mouth daily., Disp: , Rfl:  .  Polyethyl Glycol-Propyl Glycol (SYSTANE ULTRA OP), Apply 1 drop to eye daily as needed (dry eyes). , Disp: , Rfl:  .  polyethylene glycol powder (GLYCOLAX/MIRALAX) powder, Take 17 g by mouth daily as needed for moderate constipation. , Disp: , Rfl: 12 .  potassium chloride (K-DUR) 10 MEQ tablet, Take 10 mEq by mouth daily., Disp: , Rfl:  .  ranitidine (ZANTAC) 150 MG capsule, Take 150 mg  by mouth every evening., Disp: , Rfl:  .  sodium chloride (OCEAN) 0.65 % SOLN nasal spray, Place 1 spray into both nostrils as needed for congestion., Disp: , Rfl:  .  traMADol (ULTRAM) 50 MG tablet, Take 50 mg by mouth every 8 (eight) hours as needed for severe pain. , Disp: , Rfl:  .  traZODone (DESYREL) 100 MG tablet, Take 100 mg by mouth at bedtime.  , Disp: , Rfl:  .  DULoxetine (CYMBALTA) 20 MG capsule, For 2 weeks take 2 pills po daily.   Then for 2 weeks take 1 pill po and then stop, Disp: 42 capsule, Rfl: 0  PAST MEDICAL HISTORY: Past Medical History:  Diagnosis Date  . Anemia    HX  . Anxiety   . Arthritis   . Chronic kidney disease    UTI'S  . Complication of anesthesia    BP RISES WHEN AWAKENING  . Complication of anesthesia    "difficult waking up and can become combative"   . GERD (gastroesophageal reflux disease)   . H/O hiatal hernia   . Headache(784.0)   . Heart murmur   . Hypertension   . Lumbar spinal stenosis   . Lumbar spondylolysis   . MS (multiple sclerosis) ([place])   . Neuromuscular disorder ([place])    MS  . Urinary frequency   . Wears glasses     PAST SURGICAL HISTORY: Past Surgical History:  Procedure Laterality Date  . CHOLECYSTECTOMY    . EYE SURGERY     BLEPHAROPLASTY  . LUMBAR FUSION  05/10/2015  . POSTERIOR FUSION LUMBAR SPINE  04/16/11   L4-5  . TRANSFORAMINAL LUMBAR INTERBODY FUSION (TLIF) WITH PEDICLE SCREW FIXATION 1 LEVEL N/A 05/10/2015   Procedure: TRANSFORAMINAL LUMBAR INTERBODY FUSION (TLIF) WITH PEDICLE SCREW FIXATION L3-4;  Surgeon: Eustace Moore, MD;  Location: [facility];  Service: Neurosurgery;  Laterality: N/A;  TRANSFORAMINAL LUMBAR INTERBODY FUSION (TLIF) WITH PEDICLE SCREW FIXATION L3-4  . UTERINE FIBROID EMBOLIZATION      FAMILY HISTORY: Family History  Problem Relation Age of Onset  . Lung cancer Mother   . Emphysema Father   . Heart disease Unknown     SOCIAL HISTORY:  Social History   Socioeconomic History  .  Marital status: Single    Spouse name: Not on file  . Number of children: 0  . Years of education: 12  . Highest education level: Not on file  Occupational History    Comment: Does not work - Disabled  Social Needs  . Financial resource strain: Not on file  . Food insecurity:    Worry: Not on file    Inability: Not on file  . Transportation needs:    Medical: Not on file    Non-medical: Not on file  Tobacco Use  . Smoking status: Former Smoker    Packs/day: 2.00    Last attempt to quit: 04/08/1996    Years since quitting: 21.4  . Smokeless tobacco: Never Used  . Tobacco comment: ALCOHOL IN PAST  Substance and Sexual Activity  . Alcohol use: No  . Drug use: No  . Sexual activity: Yes    Birth control/protection: Post-menopausal  Lifestyle  . Physical activity:    Days per week: Not on file    Minutes per session: Not on file  . Stress: Not on file  Relationships  . Social connections:    Talks on phone: Not on file  Gets together: Not on file    Attends religious service: Not on file    Active member of club or organization: Not on file    Attends meetings of clubs or organizations: Not on file    Relationship status: Not on file  . Intimate partner violence:    Fear of current or ex partner: Not on file    Emotionally abused: Not on file    Physically abused: Not on file    Forced sexual activity: Not on file  Other Topics Concern  . Not on file  Social History Narrative   Patient does not work she is disabled. and lives at home. Patient is single.   Caffeine: 1-2 cups daily depending on the day   Right handed.     PHYSICAL EXAM  Vitals:   09/30/17 1025  BP: 124/80  Pulse: 77  Resp: 18  Weight: 173 lb (78.5 kg)  Height: _0  (1.575 m)    Body mass index is 31.64 kg/m.   General: The patient is well-developed and well-nourished and in no acute distress  Neck: Range of motion neck is reduced.   Skin: Extremities are without rash or  edema.  Neurologic Exam  Mental status: The patient is alert and oriented x 3 at the time of the examination. The patient has apparent normal recent and remote memory, with an apparently normal attention span and concentration ability.   Speech is normal.  Cranial nerves: Extraocular movements are full.  Facial strength and sensation was normal.  Trapezius strength is normal.  No obvious hearing deficits are noted.  Motor:  Muscle bulk is normal.  Muscle tone is increased in the legs.  Strength was 5/5.  Sensory: She has normal touch and vibration sensation in the arms and legs..  Coordination: She has good finger-nose-finger bilaterally.  Gait and station: Station is normal.   The gait was mild but she could walk without the walker.  Gait was much faster with the walker.. She has slight spasticity with her walking.  She cannot tandem walk.  The Romberg was negative.  Reflexes: Deep tendon reflexes are symmetric and normal bilaterally.       DIAGNOSTIC DATA (LABS, IMAGING, TESTING) - I reviewed patient records, labs, notes, testing and imaging myself where available.  Lab Results  Component Value Date   WBC 3.8 09/30/2016   HGB 12.4 09/30/2016   HCT 37.9 09/30/2016   MCV 87 09/30/2016   PLT 225 09/30/2016      Component Value Date/Time   NA 142 09/30/2016 0859   K 3.7 09/30/2016 0859   CL 99 09/30/2016 0859   CO2 30 (H) 09/30/2016 0859   GLUCOSE 109 (H) 09/30/2016 0859   GLUCOSE 85 05/02/2015 0945   BUN 15 09/30/2016 0859   CREATININE 0.75 09/30/2016 0859   CALCIUM 10.1 09/30/2016 0859   PROT 6.9 09/30/2016 0859   ALBUMIN 4.8 09/30/2016 0859   AST 20 09/30/2016 0859   ALT 31 09/30/2016 0859   ALKPHOS 107 09/30/2016 0859   BILITOT 0.3 09/30/2016 0859   GFRNONAA 86 09/30/2016 0859   GFRAA 99 09/30/2016 0859   No results found for: CHOL, HDL, LDLCALC, LDLDIRECT, TRIG, CHOLHDL Lab Results  Component Value Date   HGBA1C 5.7 (H) 05/02/2015      ASSESSMENT AND  PLAN  Multiple sclerosis (HCC)  Gait disturbance  Urinary urgency  Dry eyes - Plan: Sedimentation rate, ANA w/Reflex  Other fatigue - Plan: Sedimentation rate, ANA w/Reflex  1.  Continue baclofen 01-01-39   2.   Titrate off duloxetine.    3,   Continue Betaseron.    4,   Dry eyes and mouth likely medication related but we will check ESR/ANA to rule out Sjogren's. She will return in 6 months or sooner if there are new or worsening neurologic symptoms.   Azekiel Cremer A. Felecia Shelling, MD, PhD 6/77/0340, 3:52 PM Certified in Neurology, Clinical Neurophysiology, Sleep Medicine, Pain Medicine and Neuroimaging  [facility] [address] [place], [place] [address] [phone]

## 2017-09-30 NOTE — Patient Instructions (Signed)
I called in 20 mg duloxetine pills to allow you to slowly come off the duloxetine (currently 60 mg pills)  For two weeks take 40 mg Duloxetine  Then for 2 weeks take 20 mg duloxetine Then stop

## 2017-10-01 LAB — ANA W/REFLEX: Anti Nuclear Antibody(ANA): NEGATIVE

## 2017-10-01 LAB — SEDIMENTATION RATE: SED RATE: 13 mm/h (ref 0–40)

## 2017-10-02 ENCOUNTER — Telehealth: Payer: Self-pay | Admitting: *Deleted

## 2017-10-02 NOTE — Telephone Encounter (Signed)
-----   Message from Britt Bottom, MD sent at 10/01/2017 10:05 PM EDT ----- Please let the patient know that the lab work is fine.

## 2017-10-02 NOTE — Telephone Encounter (Signed)
Left patient a detailed message, with results, on voicemail (ok per DPR).  Provided our number to call back with any questions.  

## 2017-10-05 ENCOUNTER — Telehealth: Payer: Self-pay | Admitting: *Deleted

## 2017-10-05 NOTE — Telephone Encounter (Signed)
Spoke with Unknown and advised lab work done in our office was fine.  She verbalized understanding of same/fim

## 2017-10-05 NOTE — Telephone Encounter (Signed)
-----   Message from Britt Bottom, MD sent at 10/02/2017 11:03 PM EDT ----- Please let the patient know that the lab work is fine.

## 2017-10-26 ENCOUNTER — Telehealth: Payer: Self-pay | Admitting: Neurology

## 2017-10-26 NOTE — Telephone Encounter (Signed)
Patient says for the past 2 weeks she has been having trouble with her bowels. Everything she eats or drinks goes right through her and she has no energy. She is seeing her PCP this afternoon but wanted to let Dr. Felecia Shelling know.  She will call back after seeing her PCP. A returned call is not needed unless there are questions.

## 2017-10-26 NOTE — Telephone Encounter (Signed)
Noted/fim 

## 2017-11-10 ENCOUNTER — Telehealth: Payer: Self-pay | Admitting: *Deleted

## 2017-11-10 DIAGNOSIS — G35 Multiple sclerosis: Secondary | ICD-10-CM

## 2017-11-10 MED ORDER — INTERFERON BETA-1B 0.3 MG ~~LOC~~ KIT: PACK | SUBCUTANEOUS | 3 refills | Status: DC

## 2017-11-10 NOTE — Telephone Encounter (Signed)
Betaseron escribed  to [facility] in response to faxed response from them/fim

## 2017-12-08 ENCOUNTER — Telehealth: Payer: Self-pay | Admitting: Neurology

## 2017-12-08 NOTE — Telephone Encounter (Signed)
Pain has had pain in her neck for 2-3 weeks which is centered around spine area and is also having headaches.  Pease call and discuss.

## 2017-12-09 NOTE — Telephone Encounter (Signed)
Spoke with [employer]. She c/o neck pain, h/a, difficulty sleeping, worse over the last 7-10 days. Sts.min. relief with Fioricet, Tramadol, Baclofen, Gabapentin 300mg  qhs. TPI's have not helped much in the past.   Sts. she feels like she just needs to lie down and sleep, but is not able to.  Gabapentin is rx'd so that she can take 300-300-600. She sts. she only takes 300mg  qhs due to excessive drowsiness. I explained that she can increase Gabapentin to 600mg  qhs and this will likely help sleep. She has reported some relief with ice in the past, so I advised alternating ice/heat, also massage. She verbalized understanding of same, is agreeable with this plan, will call back if needed/fim

## 2017-12-09 NOTE — Telephone Encounter (Signed)
Noted/fim 

## 2017-12-09 NOTE — Telephone Encounter (Signed)
FYI-Patient calling stating suggestions from [place], RN has been helping and she wanted to thank her.

## 2017-12-25 ENCOUNTER — Other Ambulatory Visit: Payer: Self-pay | Admitting: Neurology

## 2018-03-11 ENCOUNTER — Telehealth: Payer: Self-pay | Admitting: Neurology

## 2018-03-11 NOTE — Telephone Encounter (Signed)
Pt is taking gabapentin 300mg  at night from Dr Felecia Shelling and she also takes gabapentin 100mg  am and mid morning from Dr Jeremy Johann . Pharmacy has advised she should get this from one provider. Pt feels Dr Tamala Julian could prescribe this for her. Please call to discuss.

## 2018-03-11 NOTE — Telephone Encounter (Signed)
Spoke with [name]. She sts. that in addition to the 300mg  Gabapentin that Dr. Felecia Shelling rx's her, she is also taking Gabapentin 100mg  bid rx'd by her pcp, Dr. Carol Ada. Sts. the pharmacy told her it will be less confusing if one physician rx's her Gabapentin. I have advised this is a good idea, to avoid confusion regarding her medications. She sts. Dr. Tamala Julian will rx. all Gabapentin for her, and I have explained Dr. Felecia Shelling will be fine with this/fim

## 2018-04-05 ENCOUNTER — Ambulatory Visit: Payer: Medicare Other | Admitting: Neurology

## 2018-04-06 ENCOUNTER — Encounter: Payer: Self-pay | Admitting: Neurology

## 2018-04-16 ENCOUNTER — Ambulatory Visit (INDEPENDENT_AMBULATORY_CARE_PROVIDER_SITE_OTHER): Payer: Medicare Other | Admitting: Neurology

## 2018-04-16 ENCOUNTER — Encounter: Payer: Self-pay | Admitting: Neurology

## 2018-04-16 VITALS — BP 122/83 | HR 63 | Ht 62.0 in | Wt 160.5 lb

## 2018-04-16 DIAGNOSIS — M5481 Occipital neuralgia: Secondary | ICD-10-CM

## 2018-04-16 DIAGNOSIS — M542 Cervicalgia: Secondary | ICD-10-CM

## 2018-04-16 DIAGNOSIS — G35 Multiple sclerosis: Secondary | ICD-10-CM

## 2018-04-16 DIAGNOSIS — G43009 Migraine without aura, not intractable, without status migrainosus: Secondary | ICD-10-CM

## 2018-04-16 DIAGNOSIS — R269 Unspecified abnormalities of gait and mobility: Secondary | ICD-10-CM | POA: Diagnosis not present

## 2018-04-16 MED ORDER — MELOXICAM 7.5 MG PO TABS: 7.5000 mg | ORAL_TABLET | Freq: Every day | ORAL | 11 refills | Status: DC

## 2018-04-16 NOTE — Progress Notes (Signed)
[facility]  PATIENT: [name] DOB: [birth year]  REFERRING DOCTOR OR PCP:  Dr. Leta Baptist  SOURCE: patient, MRI images on PACS, imaging reports, notes in EPIC and labs in EPIC  _________________________________   HISTORICAL  CHIEF COMPLAINT:  Chief Complaint  Patient presents with  . Multiple Sclerosis    She is taking her Betaseron injections every 3-4 days.  Baclofen is still working for her spasms.  Occasionally, she will have increase tightness in her legs at night.  He has been having more difficulty with urinary frequency and loose stools.  She has an appt with her PCP next week to further discuss these problems.     HISTORY OF PRESENT ILLNESS:  [name] is a 63 y.o. woman with relapsing remitting multiple sclerosis diagnosed in 1983  Update 04/16/2018: She feels her MS is stable.  She is on Betaseron and she tolerates it well.   She has been using a cane x 15 years but uses a walker out of the home.   She can go about [phone] feet with her walker.    She actually feels she is doing better than a couple years ago.   There is no weakness.  She has spasticity, helped by baclofen.    She denies any numbness or dysesthesias.  She has urinary frequency but her pattern is stable times years.  She notes mild fatigue.    She is having headaches worse in the occiput at night, right a little more than left   She can't lay flat on her pillow.   She is on gabapentin 300 am and 600 mg qHS.  Motrin 600 mg (takes 1-3 times a week) helps 4-6 hours.   Sleep is variable, sometimes worse due to pain.  Update 09/30/2017: She feels her MS has been stable and denies any new symptom or exacerbation.  Her gait is stable.    She uses a walker more for balance than strength. She also uses a cane she furniture surfs or uses a cane.     With her walker, she can go a couple hundred feet.   Arm and and strength is good.   She has some spasticity helped by baclofen 01-01-39.     She has  no change in sensation.   She has urinary freq/urgency and has some incontinence so she wears a pad.     Her left eye twitches intermittently.  Sometimes she feels her eyes are itchy before the twitching starts. She also feels the eyes are dry at times.  The twitching is only below the eye.    There is no nystagmus or ptosis.     Mood is doing very well.    She has been on Cymbalta many years and we discussed stopping it.    Update 04/02/2017: [name] continues on Betaseron. She is tolerating the injections well. She has not had any recent exacerbations.   The MRIs of the brain 10/28/2016 showed extensive lesions in the brainstem, cerebellum and the hemispheres consistent with MS. There were no acute/enhancing lesions. When compared to the MRI from 2015, there were no new lesions.  She had an URI last month followed by a GI virus.   She had diarrhea and did have one episode of fecal incontinence.   She continues to have some difficulties with her gait due to poor balance. She uses a cane or walker depending on how far she is going. Spasticity in the legs is helped by baclofen  by baclofen sometimes makes her sleepy.   Current dose is 20-20-20.      She has dysesthesias (pinprivk sensation) all over at times and she has a warm sensation in her calf at night.    She is unsure if she is still on duloxetine.   She has urinary frequency and urgency.   Vision is ok but she notes colors are washed out out of her left eye.    Mood is doing well and she denies current depression or anxiety.     She notes mild cognitive issues but does well with writing lists and is independent.   She balances her checkbook.   Sleep is ok with trazodone.    She has some fatigue.     Her headaches are doing well and they occur less frequently.        From 09/30/2016: She is experiencing daily headache  MS:   She is currently on Betaseron. She was She tolerates the injections well. She denies any exacerbation and last MRI did  not show any new changes.  HA/neck pain:   She is having fewer HA's and they are no longer daily.    She continues to have neck pain. Certain movement of her neck increase the pain.     Tylenol has not helped. Ice helps a little bit.  She has had headaches like this in the past and Fioricet has helped some.   A splenius capitus/occipitla nerve injection only helped a little bit.    Gait/strength/sensation:    She feels gait is the same.   Gait issues are mostly due to poor balance.    She uses a cane for short walks and a walker for longer distances.  She has muscle spasticity in legs, helped by baclofen.     Dysesthesias are helped by gabapentin but she takes just 300 mg nightly.   She does not note any significant numbness weakness or clumsiness in the arms.  Bladder/bowel: She has urinary frequency and urgency.She has incontinence at times and needs to use a pad.   She has 1 x nocturia.    She has constipation.  Vision:   She had optic neuritis in the past but has no sequela of it and her ophthalmologist told her that the eye look good.   She wears glasses.    Fatigue/sleep: She reports some fatigue and also notes more sleepiness tha at the last visit.  Pysical fatoigue is worse when she is more active.  She has difficulty with sleep maintenance insomnia more than with sleep onset. She snores and has rarely woken up gasping for air.  Mood/cognition: She denies any depression or anxiety.  She notes minimal cognitive difficulty. Specifically, she will sometimes have trouble coming up with the right words she feels the cognitive function is stable.  MS History:    In 1983, while at work, she was having difficulty with her gait and was unsteady and falling. She R doctor and was referred to Dr. Morrell Riddle of [facility] neurology who diagnosed her with multiple sclerosis. She was in the Betaseron drug study (was on placebo) with Dr. Terie Purser in [place]. When Betaseron became available in 1992 or 1993 she  started the medication and was seen Dr. Erling Cruz. Over the next few years she had a couple exacerbations that involved leg function, gait or balance. She had some treatments with IV steroids. She also has had some slow worsening with her gait more recently. Her last MRI of the brain  and MRI of the cervical spine was performed in 2015. There were no acute lesions but there was a fair plaque burden in the brain and several foci in the cervical spine.   EPWORTH SLEEPINESS SCALE  On a scale of 0 - 3 what is the chance of dozing:  Sitting and Reading:   3 Watching TV:    3 Sitting inactive in a public place: 2 Passenger in car for one hour: 0 Lying down to rest in the afternoon: 3 Sitting and talking to someone: 0 Sitting quietly after lunch:  3 In a car, stopped in traffic:  0  Total (out of 24):    14/24   MRI Review:   I personally reviewed the MRI of the brain and cervical spine from 07/25/2013. In the brain, there are extensive T2/FLAIR hyperintense foci involving the cerebellum, left posterior medulla, there was a normal enhancement pattern. The cervical spine showed hyperintense foci adjacent to C3, C5-C6, C6, and T1. Right posterior pons, bilateral hemispheres, predominantly in the periventricular cortical white matter.   None of these enhanced.   REVIEW OF SYSTEMS: Constitutional: No fevers, chills, sweats, or change in appetite.  Fatigue and sleepiness.  She has maintenance insomnia Eyes: No visual changes, double vision, eye pain.   Some fatigue. She has insomnia helped by trazodone. Ear, nose and throat: No hearing loss, ear pain, nasal congestion, sore throat Cardiovascular: No chest pain, palpitations Respiratory: No shortness of breath at rest or with exertion.   No wheezes.   She snores GastrointestinaI: No nausea, vomiting, diarrhea, abdominal pain, fecal incontinence Genitourinary: as above Musculoskeletal: No neck pain, back pain Integumentary: No rash, pruritus, skin  lesions Neurological: as above Psychiatric: No depression at this time.  No anxiety Endocrine: No palpitations, diaphoresis, change in appetite, change in weigh or increased thirst Hematologic/Lymphatic: No anemia, purpura, petechiae. Allergic/Immunologic: No itchy/runny eyes, nasal congestion, recent allergic reactions, rashes  ALLERGIES: Allergies  Allergen Reactions  . Other Other (See Comments)    Pt states she took a medication 2-3 yrs ago that may have been for inflammation and it caused her to be weak.    HOME MEDICATIONS:  Current Outpatient Medications:  .  amLODipine (NORVASC) 10 MG tablet, Take 10 mg by mouth daily.  , Disp: , Rfl:  .  atorvastatin (LIPITOR) 20 MG tablet, Take 20 mg by mouth daily.  , Disp: , Rfl:  .  baclofen (LIORESAL) 10 MG tablet, TAKE 1 TABLET BY MOUTH UP TO 3 TIMES A DAY, Disp: 90 tablet, Rfl: 5 .  butalbital-acetaminophen-caffeine (FIORICET, ESGIC) 50-325-40 MG tablet, TAKE 1 TABLET BY MOUTH EVERY 6 HOURS AS NEEDED FOR HEADACHE. MUST LAST A MONTH OR MORE BEFORE REFILL., Disp: 30 tablet, Rfl: 3 .  Calcium Citrate-Vitamin D (CALCIUM + D PO), Take 1 capsule by mouth daily., Disp: , Rfl:  .  DULoxetine (CYMBALTA) 20 MG capsule, For 2 weeks take 2 pills po daily.   Then for 2 weeks take 1 pill po and then stop, Disp: 42 capsule, Rfl: 0 .  ENSURE (ENSURE), Take 237 mLs by mouth 2 (two) times daily as needed (nausea). , Disp: , Rfl:  .  fluticasone (FLONASE) 50 MCG/ACT nasal spray, Place 2 sprays into the nose daily. For stuffy nose. , Disp: , Rfl:  .  gabapentin (NEURONTIN) 300 MG capsule, TAKE 1 CAPSULE BY MOUTH EVERY MORNING, 1 CAPSULE EVERY AFTERNOON, AND 2 CAPSULES AT BEDTIME. (FOR NERVE PAIN/NEUROPATHY), Disp: 120 capsule, Rfl: 5 .  ibuprofen (ADVIL,MOTRIN)  600 MG tablet, Take 1 tablet by mouth every 8 (eight) hours as needed for mild pain or moderate pain. , Disp: , Rfl:  .  Interferon Beta-1b (BETASERON) 0.3 MG KIT injection, RECONSTITUTE WITH 1.2 ML  DILUENT AND INJECT 1 ML OR 0.25MG UNDER THE SKIN (SUBCUTANEOUS INJECTION) EVERY OTHER DAY, Disp: 36 each, Rfl: 3 .  lisinopril-hydrochlorothiazide (PRINZIDE,ZESTORETIC) 20-25 MG per tablet, Take 1 tablet by mouth daily.  , Disp: , Rfl:  .  montelukast (SINGULAIR) 10 MG tablet, Take 10 mg by mouth at bedtime.  , Disp: , Rfl:  .  Multiple Vitamins-Minerals (MULTIVITAMIN WITH MINERALS) tablet, Take 1 tablet by mouth daily., Disp: , Rfl:  .  NEXIUM 40 MG capsule, Take 40 mg by mouth daily., Disp: , Rfl:  .  omega-3 acid ethyl esters (LOVAZA) 1 g capsule, Take 1 g by mouth daily., Disp: , Rfl:  .  Polyethyl Glycol-Propyl Glycol (SYSTANE ULTRA OP), Apply 1 drop to eye daily as needed (dry eyes). , Disp: , Rfl:  .  polyethylene glycol powder (GLYCOLAX/MIRALAX) powder, Take 17 g by mouth daily as needed for moderate constipation. , Disp: , Rfl: 12 .  potassium chloride (K-DUR) 10 MEQ tablet, Take 10 mEq by mouth daily., Disp: , Rfl:  .  ranitidine (ZANTAC) 150 MG capsule, Take 150 mg by mouth every evening., Disp: , Rfl:  .  sodium chloride (OCEAN) 0.65 % SOLN nasal spray, Place 1 spray into both nostrils as needed for congestion., Disp: , Rfl:  .  traMADol (ULTRAM) 50 MG tablet, Take 50 mg by mouth every 8 (eight) hours as needed for severe pain. , Disp: , Rfl:  .  traZODone (DESYREL) 100 MG tablet, Take 100 mg by mouth at bedtime.  , Disp: , Rfl:   PAST MEDICAL HISTORY: Past Medical History:  Diagnosis Date  . Anemia    HX  . Anxiety   . Arthritis   . Chronic kidney disease    UTI'S  . Complication of anesthesia    BP RISES WHEN AWAKENING  . Complication of anesthesia    "difficult waking up and can become combative"   . GERD (gastroesophageal reflux disease)   . H/O hiatal hernia   . Headache(784.0)   . Heart murmur   . Hypertension   . Lumbar spinal stenosis   . Lumbar spondylolysis   . MS (multiple sclerosis) ([place])   . Neuromuscular disorder ([place])    MS  . Urinary frequency   .  Wears glasses     PAST SURGICAL HISTORY: Past Surgical History:  Procedure Laterality Date  . CHOLECYSTECTOMY    . EYE SURGERY     BLEPHAROPLASTY  . LUMBAR FUSION  05/10/2015  . POSTERIOR FUSION LUMBAR SPINE  04/16/11   L4-5  . TRANSFORAMINAL LUMBAR INTERBODY FUSION (TLIF) WITH PEDICLE SCREW FIXATION 1 LEVEL N/A 05/10/2015   Procedure: TRANSFORAMINAL LUMBAR INTERBODY FUSION (TLIF) WITH PEDICLE SCREW FIXATION L3-4;  Surgeon: Eustace Moore, MD;  Location: [facility];  Service: Neurosurgery;  Laterality: N/A;  TRANSFORAMINAL LUMBAR INTERBODY FUSION (TLIF) WITH PEDICLE SCREW FIXATION L3-4  . UTERINE FIBROID EMBOLIZATION      FAMILY HISTORY: Family History  Problem Relation Age of Onset  . Lung cancer Mother   . Emphysema Father   . Heart disease Unknown     SOCIAL HISTORY:  Social History   Socioeconomic History  . Marital status: Single    Spouse name: Not on file  . Number of children: 0  .  Years of education: 23  . Highest education level: Not on file  Occupational History    Comment: Does not work - Disabled  Social Needs  . Financial resource strain: Not on file  . Food insecurity:    Worry: Not on file    Inability: Not on file  . Transportation needs:    Medical: Not on file    Non-medical: Not on file  Tobacco Use  . Smoking status: Former Smoker    Packs/day: 2.00    Last attempt to quit: 04/08/1996    Years since quitting: 22.0  . Smokeless tobacco: Never Used  . Tobacco comment: ALCOHOL IN PAST  Substance and Sexual Activity  . Alcohol use: No  . Drug use: No  . Sexual activity: Yes    Birth control/protection: Post-menopausal  Lifestyle  . Physical activity:    Days per week: Not on file    Minutes per session: Not on file  . Stress: Not on file  Relationships  . Social connections:    Talks on phone: Not on file    Gets together: Not on file    Attends religious service: Not on file    Active member of club or organization: Not on file     Attends meetings of clubs or organizations: Not on file    Relationship status: Not on file  . Intimate partner violence:    Fear of current or ex partner: Not on file    Emotionally abused: Not on file    Physically abused: Not on file    Forced sexual activity: Not on file  Other Topics Concern  . Not on file  Social History Narrative   Patient does not work she is disabled. and lives at home. Patient is single.   Caffeine: 1-2 cups daily depending on the day   Right handed.     PHYSICAL EXAM  Vitals:   04/16/18 0859  BP: 122/83  Pulse: 63  Weight: 160 lb 8 oz (72.8 kg)  Height: '5\' 2"'$  (1.575 m)    Body mass index is 29.36 kg/m.   General: The patient is well-developed and well-nourished and in no acute distress  Neck: Range of motion neck is mildly reduced in the neck.  She is mildly tender over the occiput, left greater than right  Skin: Extremities are without rash or edema.  Neurologic Exam  Mental status: The patient is alert and oriented x 3 at the time of the examination. The patient has apparent normal recent and remote memory, with an apparently normal attention span and concentration ability.   Speech is normal.  Cranial nerves: Extraocular movements are full.  Facial strength and sensation was normal.  Trapezius strength is normal.  No obvious hearing deficits are noted.  Motor:  Muscle bulk is normal.  Muscle tone is increased in the legs.  Strength was 5/5  Sensory: She has intact sensation to touch in the arms and legs.  Coordination: She has good finger-nose-finger bilaterally.  Heel-to-shin is reduced  Gait and station: Station is normal.   Her gait is mildly wide with a cane but her stride is good and she turns well.  Gait was faster with a walker.  There is some spasticity in the gait.  She is unable to tandem walk.  Romberg is negative.   Reflexes: Deep tendon reflexes are symmetric and normal bilaterally.       DIAGNOSTIC DATA (LABS, IMAGING,  TESTING) - I reviewed patient records, labs, notes,  testing and imaging myself where available.  Lab Results  Component Value Date   WBC 3.8 09/30/2016   HGB 12.4 09/30/2016   HCT 37.9 09/30/2016   MCV 87 09/30/2016   PLT 225 09/30/2016      Component Value Date/Time   NA 142 09/30/2016 0859   K 3.7 09/30/2016 0859   CL 99 09/30/2016 0859   CO2 30 (H) 09/30/2016 0859   GLUCOSE 109 (H) 09/30/2016 0859   GLUCOSE 85 05/02/2015 0945   BUN 15 09/30/2016 0859   CREATININE 0.75 09/30/2016 0859   CALCIUM 10.1 09/30/2016 0859   PROT 6.9 09/30/2016 0859   ALBUMIN 4.8 09/30/2016 0859   AST 20 09/30/2016 0859   ALT 31 09/30/2016 0859   ALKPHOS 107 09/30/2016 0859   BILITOT 0.3 09/30/2016 0859   GFRNONAA 86 09/30/2016 0859   GFRAA 99 09/30/2016 0859   No results found for: CHOL, HDL, LDLCALC, LDLDIRECT, TRIG, CHOLHDL Lab Results  Component Value Date   HGBA1C 5.7 (H) 05/02/2015      ASSESSMENT AND PLAN  Multiple sclerosis (HCC)  Gait disturbance  Migraine without aura and without status migrainosus, not intractable  Neck pain  Bilateral occipital neuralgia  1.   Continue baclofen and increase gabapentin to 300 qAM -300 (dinnertime)- 600 (qHS) 2.   meloxicam 7.5 mg daily.   3,   We discussed that as she has had MS > 35 years and is older than 50, her MS may be fairly inactive and we could consider stopping the Betaseron.   She would like to do this.   We will recheck an MRI later this year and for a couple years..    4,    She will return in 6 months or sooner if there are new or worsening neurologic symptoms.   Nikko Quast A. Felecia Shelling, MD, PhD 09/21/1001, 49:61 AM Certified in Neurology, Clinical Neurophysiology, Sleep Medicine, Pain Medicine and Neuroimaging  [facility] [address] [place], [place] [address] [phone]

## 2018-04-19 ENCOUNTER — Telehealth: Payer: Self-pay | Admitting: Neurology

## 2018-04-19 NOTE — Telephone Encounter (Signed)
Called pt back. She was concerned about potential SE that she read from the pharmacy for meloxicam. She is not comfortable in taking this. She lives alone. She prefers to not take it. She does not want an alternative at this time. I removed from pt med list. She will call back if she has further questions or concerns.

## 2018-04-19 NOTE — Telephone Encounter (Signed)
Pt states she has only taken this medicaiton once and since she has read the side effects she is very concerned and no longer wants to take it.  Please call

## 2018-04-19 NOTE — Addendum Note (Signed)
Addended by: Hope Pigeon on: 04/19/2018 04:36 PM   Modules accepted: Orders

## 2018-04-19 NOTE — Telephone Encounter (Signed)
Called, LVM for pt returning her call. 

## 2018-04-20 NOTE — Telephone Encounter (Signed)
Pt called back and decided to continue her medication after thinking it over.

## 2018-04-20 NOTE — Telephone Encounter (Signed)
Placed medication back on med list.

## 2018-05-19 ENCOUNTER — Telehealth: Payer: Self-pay | Admitting: Neurology

## 2018-05-19 NOTE — Telephone Encounter (Addendum)
Pt states since she and Dr Felecia Shelling agreed she would come off the Interferon Beta-1b (BETASERON) 0.3 MG KIT injection she has had feelings of anxiousness.  Pt asking if this is as a result of being off of the Interferon Beta-1b (BETASERON) 0.3 MG KIT injection. Pt wants Dr Felecia Shelling to know that her heart beats deep and hard. Pt states this has been going on for about 10-14 days, Please call

## 2018-05-19 NOTE — Telephone Encounter (Signed)
I called pt back. Advised coming off betaseron would not cause these sx. She should f/u with PCP about this.  She already called PCP and they have given her recommendations about her medications already. Nothing further needed.

## 2018-06-08 ENCOUNTER — Telehealth: Payer: Self-pay | Admitting: Neurology

## 2018-06-08 NOTE — Telephone Encounter (Signed)
Pt is calling in and stating she wants a call concerning confirmation about her immune system

## 2018-06-08 NOTE — Telephone Encounter (Signed)
I called [place] back and addressed her concerns. She will call back if she has further questions/concerns.  She denies being symptomatic for coronavirus. She will contact PCP if she does.

## 2018-08-02 ENCOUNTER — Telehealth: Payer: Self-pay | Admitting: Neurology

## 2018-08-02 NOTE — Telephone Encounter (Signed)
Pt is needing a refill on her baclofen (LIORESAL) 10 MG tablet but she needs the refill sent to Merrilee Seashore the Pharmacist at [facility]. Pt did not have the phone number at hand and will call back with that number. Pt states they get all of her medications put together in a pouch and mail it to her at once.

## 2018-08-02 NOTE — Telephone Encounter (Signed)
Waiting on call back from pt.  

## 2018-08-03 ENCOUNTER — Other Ambulatory Visit: Payer: Self-pay | Admitting: Family Medicine

## 2018-08-03 DIAGNOSIS — Z1231 Encounter for screening mammogram for malignant neoplasm of breast: Secondary | ICD-10-CM

## 2018-08-03 MED ORDER — BACLOFEN 10 MG PO TABS: ORAL_TABLET | ORAL | 5 refills | Status: DC

## 2018-08-03 NOTE — Telephone Encounter (Addendum)
Received fax notification from [facility] that pt converting to their pharmacy. Phone: [phone]. Fax: [phone]. They are requesting rx for baclofen. I escribed rx.

## 2018-08-19 ENCOUNTER — Telehealth: Payer: Self-pay | Admitting: Neurology

## 2018-08-19 NOTE — Telephone Encounter (Signed)
Called pt back. Relayed Dr. Garth Bigness recommendation. She is going to try one capsule at dinner and one at bedtime and see if this helps improve her sx. She is not comfortable doing 2 capsules at bedtime. States it would make her feel too "hungover" in the morning. Advised I will send FYI to Dr. Felecia Shelling. Nothing further needed.

## 2018-08-19 NOTE — Telephone Encounter (Signed)
Pt states she is getting jolt like feelings in her legs especially in the R leg and it makes it very hard for her to sleep. Pt can not keep her legs still. Please advise.

## 2018-08-19 NOTE — Telephone Encounter (Signed)
Called, LVM returning pt call from earlier.

## 2018-08-19 NOTE — Telephone Encounter (Signed)
She can increase the gabapentin to one pill at dinner and 2 pills at night

## 2018-08-20 ENCOUNTER — Telehealth: Payer: Self-pay | Admitting: Neurology

## 2018-08-20 NOTE — Telephone Encounter (Signed)
Tia from [facility] called requesting a new prescription for the pt's gabapentin (NEURONTIN) 300 MG capsule Tia states the pt informed her that it changed to 1 pill at 6pm and 1 at bed time. Please advise. Phone number [phone] Fax number [phone]

## 2018-08-23 MED ORDER — GABAPENTIN 300 MG PO CAPS: ORAL_CAPSULE | ORAL | 5 refills | Status: DC

## 2018-08-23 NOTE — Telephone Encounter (Signed)
E-cribed new rx to pharmacy as requested.

## 2018-09-23 ENCOUNTER — Ambulatory Visit: Payer: Medicare Other

## 2018-10-18 ENCOUNTER — Other Ambulatory Visit: Payer: Self-pay | Admitting: Neurology

## 2018-10-19 ENCOUNTER — Ambulatory Visit: Payer: Medicare Other | Admitting: Neurology

## 2018-10-20 ENCOUNTER — Encounter: Payer: Self-pay | Admitting: Neurology

## 2018-10-20 ENCOUNTER — Telehealth: Payer: Self-pay | Admitting: Neurology

## 2018-10-20 NOTE — Telephone Encounter (Signed)
Pt is asking if Dr Felecia Shelling has ever prescribed her baclofen (LIORESAL) 20 MG tablet, please call

## 2018-10-20 NOTE — Telephone Encounter (Signed)
If she did better on 40 mg at night, we can call in that prescription.  20 mg #60  2 p.o. nightly    5 refills

## 2018-10-20 NOTE — Telephone Encounter (Signed)
Called pt to get a little more information. She wanted to know if Dr. Felecia Shelling had every prescribed her the 20mg  tablet of baclofen. Reviewed chart and relayed he has not. She states she usually takes two 20mg  tablets at bedtime to help with her legs and sleeping. Her PCP recently reduced her dose to have her take 10mg  at lunch and two 10mg  tablets at bed. She states the night time dose in effective and cannot sleep but PCP does not want to increase her dose from there. Advised I will discuss with Dr. Felecia Shelling and call her back tomorrow at the latest.

## 2018-10-20 NOTE — Telephone Encounter (Signed)
Dr. Sater- what would you recommend? 

## 2018-10-21 NOTE — Telephone Encounter (Signed)
I called pt and relayed Dr. Garth Bigness message. She is agreeable to this. She will inform PCP that we are going to take over prescribing and managing rx baclofen moving forward. Pt will use up baclofen she has left and call when she needs new rx called in.

## 2018-11-05 ENCOUNTER — Ambulatory Visit
Admission: RE | Admit: 2018-11-05 | Discharge: 2018-11-05 | Disposition: A | Discharge status: Home / Self Care | Payer: Medicare Other | Source: Ambulatory Visit | Attending: Family Medicine | Admitting: Family Medicine

## 2018-11-05 ENCOUNTER — Other Ambulatory Visit: Payer: Self-pay

## 2018-11-05 DIAGNOSIS — Z1231 Encounter for screening mammogram for malignant neoplasm of breast: Secondary | ICD-10-CM

## 2018-11-30 ENCOUNTER — Telehealth: Payer: Self-pay | Admitting: Neurology

## 2018-11-30 MED ORDER — BACLOFEN 20 MG PO TABS: ORAL_TABLET | ORAL | 5 refills | Status: DC

## 2018-11-30 NOTE — Telephone Encounter (Signed)
E-scribed rx to pharmacy.  

## 2018-11-30 NOTE — Addendum Note (Signed)
Addended by: Hope Pigeon on: 11/30/2018 01:38 PM   Modules accepted: Orders

## 2018-11-30 NOTE — Telephone Encounter (Signed)
Pt has called for a refill on her baclofen (LIORESAL) 20 MG tablet [facility] [phone] or [phone]

## 2018-12-06 ENCOUNTER — Telehealth: Payer: Self-pay | Admitting: Neurology

## 2018-12-06 NOTE — Telephone Encounter (Signed)
Pt called wanting to speak to the RN about some concerns she has about her having to urinate quite a bit during the day, 15-16 times a day. Please advise.

## 2018-12-06 NOTE — Telephone Encounter (Signed)
Called pt back. In last month or two she has had an increase in frequency of urination . She spoke with PCP last week who provided her keflex to take 500mg  po qd  x 7 days. She started this 12/03/2018. She is going to contact her PCP to discuss next steps. States she has been on medication in the past from PCP to help decrease frequency of urination but she could not tolerate. She cannot remember the name of the medication. Advised I will discuss with Dr. Felecia Shelling and call back with any recommendations.

## 2018-12-06 NOTE — Telephone Encounter (Signed)
Lets have her try veicare generic 5 mg daily   # 30 its tolerated better thasn some other bladder med's

## 2018-12-06 NOTE — Telephone Encounter (Signed)
Called pt back. She is going this Wednesday to PCP to give urine sample. She is going to speak with PCP then to see if she has been on Vesicare in the past and call back after that appt to let us know if she would like to try this. Nothing further needed.

## 2018-12-06 NOTE — Telephone Encounter (Signed)
Dr. Sater- what would you recommend? 

## 2018-12-20 ENCOUNTER — Encounter: Payer: Self-pay | Admitting: Neurology

## 2018-12-20 ENCOUNTER — Telehealth: Payer: Self-pay | Admitting: Neurology

## 2018-12-20 ENCOUNTER — Ambulatory Visit (INDEPENDENT_AMBULATORY_CARE_PROVIDER_SITE_OTHER): Payer: Medicare Other | Admitting: Neurology

## 2018-12-20 ENCOUNTER — Other Ambulatory Visit: Payer: Self-pay

## 2018-12-20 VITALS — BP 114/72 | HR 73 | Temp 97.3°F | Ht 62.0 in | Wt 152.0 lb

## 2018-12-20 DIAGNOSIS — R269 Unspecified abnormalities of gait and mobility: Secondary | ICD-10-CM | POA: Diagnosis not present

## 2018-12-20 DIAGNOSIS — G35 Multiple sclerosis: Secondary | ICD-10-CM

## 2018-12-20 DIAGNOSIS — R5383 Other fatigue: Secondary | ICD-10-CM

## 2018-12-20 DIAGNOSIS — R3915 Urgency of urination: Secondary | ICD-10-CM

## 2018-12-20 MED ORDER — MIRABEGRON ER 25 MG PO TB24: 25.0000 mg | ORAL_TABLET | Freq: Every day | ORAL | 11 refills | Status: DC

## 2018-12-20 MED ORDER — SERTRALINE HCL 100 MG PO TABS: 100.0000 mg | ORAL_TABLET | Freq: Every day | ORAL | 11 refills | Status: DC

## 2018-12-20 MED ORDER — ALPRAZOLAM 0.5 MG PO TABS
0.5000 mg | ORAL_TABLET | Freq: Every evening | ORAL | 0 refills | Status: DC | PRN
Start: 2018-12-20 — End: 2019-06-09

## 2018-12-20 NOTE — Telephone Encounter (Signed)
[employer] medicare order sent to to GI. No auth they will reach out to the patient to schedule.

## 2018-12-20 NOTE — Progress Notes (Signed)
[facility]  PATIENT: [name] DOB: 1955-10-03  REFERRING DOCTOR OR PCP:  Dr. Leta Baptist  SOURCE: patient, MRI images on PACS, imaging reports, notes in EPIC and labs in EPIC  _________________________________   HISTORICAL  CHIEF COMPLAINT:  Chief Complaint  Patient presents with   Follow-up    RM 12 with Kristen (97.5). Last seen 04/16/2018.    Multiple Sclerosis    Stopped Betaseron at last visit.    Gait Problem    Ambulates with walker. Uses cane some around her house.     HISTORY OF PRESENT ILLNESS:  [name] is a 63 y.o. woman with relapsing remitting multiple sclerosis diagnosed in 1983  Update 12/20/18 After the last visit, she stopped the Betaseron.    She had had stable MRI's and had MS > 35 years with no relapses at least 10-15 years.    She is ambulating with a walker outside her home and a cane in her home.    No recent falls.  She is noting more anxiety.   She notes she has a tremor inside and in her arms when she is anxious.   She has some depression too but anxiety bothers her more.    She is on duloxetine 20 mg po bid.    She has insomnia some nights and takes 1/2 trazodone if she has trouble initiating sleep.      She has more urge incontinence in general (worse a few weeks ago, closer to baseline now).    She was treated for a UTI.    In the past, she had a very dry mouth with oxybutynin.  Vision is cloudy and she will be doing cataract surgery soon.         Update 04/16/2018: She feels her MS is stable.  She is on Betaseron and she tolerates it well.   She has been using a cane x 15 years but uses a walker out of the home.   She can go about [phone] feet with her walker.    She actually feels she is doing better than a couple years ago.   There is no weakness.  She has spasticity, helped by baclofen.    She denies any numbness or dysesthesias.  She has urinary frequency but her pattern is stable times years.  She notes mild  fatigue.    She is having headaches worse in the occiput at night, right a little more than left   She can't lay flat on her pillow.   She is on gabapentin 300 am and 600 mg qHS.  Motrin 600 mg (takes 1-3 times a week) helps 4-6 hours.   Sleep is variable, sometimes worse due to pain.  Update 09/30/2017: She feels her MS has been stable and denies any new symptom or exacerbation.  Her gait is stable.    She uses a walker more for balance than strength. She also uses a cane she furniture surfs or uses a cane.     With her walker, she can go a couple hundred feet.   Arm and and strength is good.   She has some spasticity helped by baclofen 01-01-39.     She has no change in sensation.   She has urinary freq/urgency and has some incontinence so she wears a pad.     Her left eye twitches intermittently.  Sometimes she feels her eyes are itchy before the twitching starts. She also feels the eyes are dry at times.  The twitching is only below the eye.    There is no nystagmus or ptosis.     Mood is doing very well.    She has been on Cymbalta many years and we discussed stopping it.    Update 04/02/2017: [name] continues on Betaseron. She is tolerating the injections well. She has not had any recent exacerbations.   The MRIs of the brain 10/28/2016 showed extensive lesions in the brainstem, cerebellum and the hemispheres consistent with MS. There were no acute/enhancing lesions. When compared to the MRI from 2015, there were no new lesions.  She had an URI last month followed by a GI virus.   She had diarrhea and did have one episode of fecal incontinence.   She continues to have some difficulties with her gait due to poor balance. She uses a cane or walker depending on how far she is going. Spasticity in the legs is helped by baclofen by baclofen sometimes makes her sleepy.   Current dose is 20-20-20.      She has dysesthesias (pinprivk sensation) all over at times and she has a warm sensation in her  calf at night.    She is unsure if she is still on duloxetine.   She has urinary frequency and urgency.   Vision is ok but she notes colors are washed out out of her left eye.    Mood is doing well and she denies current depression or anxiety.     She notes mild cognitive issues but does well with writing lists and is independent.   She balances her checkbook.   Sleep is ok with trazodone.    She has some fatigue.     Her headaches are doing well and they occur less frequently.        From 09/30/2016: She is experiencing daily headache  MS:   She is currently on Betaseron. She was She tolerates the injections well. She denies any exacerbation and last MRI did not show any new changes.  HA/neck pain:   She is having fewer HA's and they are no longer daily.    She continues to have neck pain. Certain movement of her neck increase the pain.     Tylenol has not helped. Ice helps a little bit.  She has had headaches like this in the past and Fioricet has helped some.   A splenius capitus/occipitla nerve injection only helped a little bit.    Gait/strength/sensation:    She feels gait is the same.   Gait issues are mostly due to poor balance.    She uses a cane for short walks and a walker for longer distances.  She has muscle spasticity in legs, helped by baclofen.     Dysesthesias are helped by gabapentin but she takes just 300 mg nightly.   She does not note any significant numbness weakness or clumsiness in the arms.  Bladder/bowel: She has urinary frequency and urgency.She has incontinence at times and needs to use a pad.   She has 1 x nocturia.    She has constipation.  Vision:   She had optic neuritis in the past but has no sequela of it and her ophthalmologist told her that the eye look good.   She wears glasses.    Fatigue/sleep: She reports some fatigue and also notes more sleepiness tha at the last visit.  Pysical fatoigue is worse when she is more active.  She has difficulty with sleep  maintenance insomnia more  than with sleep onset. She snores and has rarely woken up gasping for air.  Mood/cognition: She denies any depression or anxiety.  She notes minimal cognitive difficulty. Specifically, she will sometimes have trouble coming up with the right words she feels the cognitive function is stable.  MS History:    In 1983, while at work, she was having difficulty with her gait and was unsteady and falling. She R doctor and was referred to Dr. Morrell Riddle of [facility] neurology who diagnosed her with multiple sclerosis. She was in the Betaseron drug study (was on placebo) with Dr. Terie Purser in [place]. When Betaseron became available in 1992 or 1993 she started the medication and was seen Dr. Erling Cruz. Over the next few years she had a couple exacerbations that involved leg function, gait or balance. She had some treatments with IV steroids. She also has had some slow worsening with her gait more recently. Her last MRI of the brain and MRI of the cervical spine was performed in 2015. There were no acute lesions but there was a fair plaque burden in the brain and several foci in the cervical spine.   EPWORTH SLEEPINESS SCALE  On a scale of 0 - 3 what is the chance of dozing:  Sitting and Reading:   3 Watching TV:    3 Sitting inactive in a public place: 2 Passenger in car for one hour: 0 Lying down to rest in the afternoon: 3 Sitting and talking to someone: 0 Sitting quietly after lunch:  3 In a car, stopped in traffic:  0  Total (out of 24):    14/24   MRI Review:   I personally reviewed the MRI of the brain and cervical spine from 07/25/2013. In the brain, there are extensive T2/FLAIR hyperintense foci involving the cerebellum, left posterior medulla, there was a normal enhancement pattern. The cervical spine showed hyperintense foci adjacent to C3, C5-C6, C6, and T1. Right posterior pons, bilateral hemispheres, predominantly in the periventricular cortical white matter.   None of  these enhanced.   REVIEW OF SYSTEMS: Constitutional: No fevers, chills, sweats, or change in appetite.  Fatigue and sleepiness.  She has maintenance insomnia Eyes: No visual changes, double vision, eye pain.   Some fatigue. She has insomnia helped by trazodone. Ear, nose and throat: No hearing loss, ear pain, nasal congestion, sore throat Cardiovascular: No chest pain, palpitations Respiratory: No shortness of breath at rest or with exertion.   No wheezes.   She snores GastrointestinaI: No nausea, vomiting, diarrhea, abdominal pain, fecal incontinence Genitourinary: as above Musculoskeletal: No neck pain, back pain Integumentary: No rash, pruritus, skin lesions Neurological: as above Psychiatric: No depression at this time.  No anxiety Endocrine: No palpitations, diaphoresis, change in appetite, change in weigh or increased thirst Hematologic/Lymphatic: No anemia, purpura, petechiae. Allergic/Immunologic: No itchy/runny eyes, nasal congestion, recent allergic reactions, rashes  ALLERGIES: Allergies  Allergen Reactions   Other Other (See Comments)    Pt states she took a medication 2-3 yrs ago that may have been for inflammation and it caused her to be weak.    HOME MEDICATIONS:  Current Outpatient Medications:    acetaminophen (TYLENOL) 500 MG tablet, Take 500 mg by mouth every 6 (six) hours as needed., Disp: , Rfl:    amLODipine (NORVASC) 10 MG tablet, Take 10 mg by mouth daily.  , Disp: , Rfl:    atorvastatin (LIPITOR) 20 MG tablet, Take 20 mg by mouth daily.  , Disp: , Rfl:    baclofen (  LIORESAL) 20 MG tablet, Take 2 tablets by mouth at bedtime, Disp: 60 tablet, Rfl: 5   DULoxetine (CYMBALTA) 20 MG capsule, For 2 weeks take 2 pills po daily.   Then for 2 weeks take 1 pill po and then stop, Disp: 42 capsule, Rfl: 0   fluticasone (FLONASE) 50 MCG/ACT nasal spray, Place 2 sprays into the nose daily. For stuffy nose. , Disp: , Rfl:    gabapentin (NEURONTIN) 300 MG  capsule, Take one capsule at dinner and one at bedtime. (FOR NERVE PAIN/NEUROPATHY), Disp: 60 capsule, Rfl: 5   ibuprofen (ADVIL,MOTRIN) 600 MG tablet, Take 1 tablet by mouth every 8 (eight) hours as needed for mild pain or moderate pain. , Disp: , Rfl:    losartan-hydrochlorothiazide (HYZAAR) 100-25 MG tablet, Take 1 tablet by mouth daily., Disp: , Rfl:    montelukast (SINGULAIR) 10 MG tablet, Take 10 mg by mouth at bedtime.  , Disp: , Rfl:    NEXIUM 40 MG capsule, Take 40 mg by mouth daily., Disp: , Rfl:    nystatin (NYAMYC) powder, Apply topically 4 (four) times daily., Disp: , Rfl:    ondansetron (ZOFRAN) 4 MG tablet, Take 4 mg by mouth every 8 (eight) hours as needed for nausea or vomiting., Disp: , Rfl:    potassium chloride (K-DUR) 10 MEQ tablet, Take 10 mEq by mouth daily., Disp: , Rfl:    traMADol (ULTRAM) 50 MG tablet, Take 50 mg by mouth every 8 (eight) hours as needed for severe pain. , Disp: , Rfl:    traZODone (DESYREL) 100 MG tablet, Take 100 mg by mouth at bedtime.  , Disp: , Rfl:    ALPRAZolam (XANAX) 0.5 MG tablet, Take 1 tablet (0.5 mg total) by mouth at bedtime as needed for anxiety. Take one or two po before MRI, Disp: 2 tablet, Rfl: 0   mirabegron ER (MYRBETRIQ) 25 MG TB24 tablet, Take 1 tablet (25 mg total) by mouth daily., Disp: 30 tablet, Rfl: 11  PAST MEDICAL HISTORY: Past Medical History:  Diagnosis Date   Anemia    HX   Anxiety    Arthritis    Chronic kidney disease    UTI'S   Complication of anesthesia    BP RISES WHEN AWAKENING   Complication of anesthesia    "difficult waking up and can become combative"    GERD (gastroesophageal reflux disease)    H/O hiatal hernia    Headache(784.0)    Heart murmur    Hypertension    Lumbar spinal stenosis    Lumbar spondylolysis    MS (multiple sclerosis) (HCC)    Neuromuscular disorder (HCC)    MS   Urinary frequency    Wears glasses     PAST SURGICAL HISTORY: Past Surgical  History:  Procedure Laterality Date   CHOLECYSTECTOMY     EYE SURGERY     BLEPHAROPLASTY   LUMBAR FUSION  05/10/2015   POSTERIOR FUSION LUMBAR SPINE  04/16/11   L4-5   TRANSFORAMINAL LUMBAR INTERBODY FUSION (TLIF) WITH PEDICLE SCREW FIXATION 1 LEVEL N/A 05/10/2015   Procedure: TRANSFORAMINAL LUMBAR INTERBODY FUSION (TLIF) WITH PEDICLE SCREW FIXATION L3-4;  Surgeon: Eustace Moore, MD;  Location: [facility];  Service: Neurosurgery;  Laterality: N/A;  TRANSFORAMINAL LUMBAR INTERBODY FUSION (TLIF) WITH PEDICLE SCREW FIXATION L3-4   UTERINE FIBROID EMBOLIZATION      FAMILY HISTORY: Family History  Problem Relation Age of Onset   Lung cancer Mother    Emphysema Father    Heart  disease Unknown     SOCIAL HISTORY:  Social History   Socioeconomic History   Marital status: Single    Spouse name: Not on file   Number of children: 0   Years of education: 14   Highest education level: Not on file  Occupational History    Comment: 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, product/process development strain: Not on file   Food insecurity    Worry: Not on file    Inability: Not on file   Transportation needs    Medical: Not on file    Non-medical: Not on file  Tobacco Use   Smoking status: Former Smoker    Packs/day: 2.00    Quit date: 04/08/1996    Years since quitting: 22.7   Smokeless tobacco: Never Used   Tobacco comment: ALCOHOL IN PAST  Substance and Sexual Activity   Alcohol use: No   Drug use: No   Sexual activity: Yes    Birth control/protection: Post-menopausal  Lifestyle   Physical activity    Days per week: Not on file    Minutes per session: Not on file   Stress: Not on file  Relationships   Social connections    Talks on phone: Not on file    Gets together: Not on file    Attends religious service: Not on file    Active member of club or organization: Not on file    Attends meetings of clubs or organizations: Not on file    Relationship  status: Not on file   Intimate partner violence    Fear of current or ex partner: Not on file    Emotionally abused: Not on file    Physically abused: Not on file    Forced sexual activity: Not on file  Other Topics Concern   Not on file  Social History Narrative   Patient does not work she is disabled. and lives at home. Patient is single.   Caffeine: 1-2 cups daily depending on the day   Right handed.     PHYSICAL EXAM  Vitals:   12/20/18 0950  BP: 114/72  Pulse: 73  Temp: (!) 97.3 F (36.3 C)  Weight: 152 lb (68.9 kg)  Height: 5\' 2"  (1.575 m)    Body mass index is 27.8 kg/m.   General: The patient is well-developed and well-nourished and in no acute distress  Skin: Extremities are without rash or edema.  Neurologic Exam  Mental status: The patient is alert and oriented x 3 at the time of the examination. The patient has apparent normal recent and remote memory, with an apparently normal attention span and concentration ability.   Speech is normal.  Cranial nerves: Extraocular movements are full.  Facial strength and sensation was normal.  Trapezius strength is normal.  No obvious hearing deficits are noted.  Motor:  Muscle bulk is normal.  Muscle tone is increased in the legs.  Strength was 5/5  Sensory: She has intact sensation to touch in the arms and legs.  Coordination: She has good finger-nose-finger bilaterally.  Heel-to-shin is reduced  Gait and station: Station is normal.  She has mildly wide gait and cannot tandem.  She needs a cane.  .  Romberg is borderline.   Reflexes: Deep tendon reflexes are symmetric and normal bilaterally.       DIAGNOSTIC DATA (LABS, IMAGING, TESTING) - I reviewed patient records, labs, notes, testing and imaging myself where available.  Lab Results  Component  Value Date   WBC 3.8 09/30/2016   HGB 12.4 09/30/2016   HCT 37.9 09/30/2016   MCV 87 09/30/2016   PLT 225 09/30/2016      Component Value Date/Time   NA  142 09/30/2016 0859   K 3.7 09/30/2016 0859   CL 99 09/30/2016 0859   CO2 30 (H) 09/30/2016 0859   GLUCOSE 109 (H) 09/30/2016 0859   GLUCOSE 85 05/02/2015 0945   BUN 15 09/30/2016 0859   CREATININE 0.75 09/30/2016 0859   CALCIUM 10.1 09/30/2016 0859   PROT 6.9 09/30/2016 0859   ALBUMIN 4.8 09/30/2016 0859   AST 20 09/30/2016 0859   ALT 31 09/30/2016 0859   ALKPHOS 107 09/30/2016 0859   BILITOT 0.3 09/30/2016 0859   GFRNONAA 86 09/30/2016 0859   GFRAA 99 09/30/2016 0859   No results found for: CHOL, HDL, LDLCALC, LDLDIRECT, TRIG, CHOLHDL Lab Results  Component Value Date   HGBA1C 5.7 (H) 05/02/2015      ASSESSMENT AND PLAN  Urinary urgency  Multiple sclerosis ([place]) - Plan: MR BRAIN WO CONTRAST  Other fatigue  Gait disturbance  1.   She will continue off Betaseron.   We will check a brain MRI and if progression,, consider restarting a DMT.    I will send in 2 pills xanax for claustrophobia. 2.   Change duloxetine to Zoloft 100 mg (transition x 1 week)   3,  Add mirbetriq for bladder.    She could not tolerate oxybutynin int he past. 4,    Continue other medications.  She will return in 6 months or sooner if there are new or worsening neurologic symptoms.   Eliu Batch A. Felecia Shelling, MD, PhD [id number], [id number] AM Certified in Neurology, Clinical Neurophysiology, Sleep Medicine, Pain Medicine and Neuroimaging  [facility] [address], [place], [place] [address] [phone]

## 2018-12-24 ENCOUNTER — Telehealth: Payer: Self-pay | Admitting: Neurology

## 2018-12-24 NOTE — Telephone Encounter (Signed)
Pt called stating that she is having a lot of things going on right now and would like to know if it is ok to postpone her MRI to a later date. Please advise.

## 2018-12-27 ENCOUNTER — Telehealth: Payer: Self-pay | Admitting: Neurology

## 2018-12-27 NOTE — Telephone Encounter (Signed)
Dr. Sater- are you okay with this? 

## 2018-12-27 NOTE — Telephone Encounter (Signed)
Pt called wanting to speak to RN about her medication and her depression. Please advise.

## 2018-12-27 NOTE — Telephone Encounter (Signed)
Called, LVM for pt letting her know it is ok to postpone MRI per Dr. Felecia Shelling. Gave [facility] phone number if she has further questions.

## 2018-12-27 NOTE — Telephone Encounter (Signed)
Ok to postpone

## 2018-12-27 NOTE — Telephone Encounter (Signed)
Called pt back. She is currently weaning off duloxetine transitioning to zoloft. She is has two more days of duloxetine left and currently taking 1/2 tablet (50mg ) of zoloft daily. She will go to 100mg  po qd this Thursday of Zoloft. She is aware to give it 2-3 weeks of taking the medication to reach max benefit. She will call back if any further questions/concerns.   Offered to get her signed up for mychart but she declined. Did not want to use. Prefers to talk with someone on the phone.

## 2018-12-27 NOTE — Telephone Encounter (Signed)
Pt called and was informed that it is ok to postpone MRI.

## 2018-12-30 ENCOUNTER — Telehealth: Payer: Self-pay | Admitting: Neurology

## 2018-12-30 MED ORDER — ARIPIPRAZOLE 5 MG PO TABS: 5.0000 mg | ORAL_TABLET | Freq: Every day | ORAL | 0 refills | Status: DC

## 2018-12-30 NOTE — Telephone Encounter (Signed)
Zoloft often helps anxiety better than Cymbalta (duloxetine) for most people.    If she felt better on cymbalta, we can switch back.

## 2018-12-30 NOTE — Telephone Encounter (Signed)
Pt called wanting to be advised by the provider or RN on when she should take her medications either in the morning or afternoon. Please advise.

## 2018-12-30 NOTE — Telephone Encounter (Signed)
Called pt back. She was wondering when to take Myrbetriq and Zoloft. Advised it was ok to take in the morning.

## 2018-12-30 NOTE — Telephone Encounter (Signed)
Called a spoke with pt. Relayed Dr. Garth Bigness message. She states zoloft making her "shake on the inside". She is wondering if there is something she can take while getting used to zoloft to help with transition. Advised we typically do not prescribe anything for this reason. She does not want to go back to cymbalta. She came tearful on the phone. She would like call back from Dr. Felecia Shelling to discuss her options. Advised  I will send message to Dr. Felecia Shelling to call her. She verbalized understanding and appreciation.

## 2018-12-30 NOTE — Telephone Encounter (Signed)
Pt called back and states that she would like to discuss the Zoloft with RN. Pt states that it is not working for her and she is not feeling right. Please advise.

## 2018-12-30 NOTE — Telephone Encounter (Signed)
She has had more anxiety.  During the transition from Cymbalta to Zoloft, she is noting even more anxiety and agitation.  I am going to have her start Abilify 5 mg.  Hopefully we will need to overlap with this medication for about a month.  I am also hoping that the Zoloft will help her anxiety better than the Cymbalta date.    If she does not feel there is enough benefit, I would recommend that she follow-up with psychiatry.

## 2018-12-30 NOTE — Telephone Encounter (Signed)
Dr. Sater- what would you recommend? 

## 2019-01-03 NOTE — Telephone Encounter (Signed)
Dr. Sater- FYI 

## 2019-01-03 NOTE — Telephone Encounter (Signed)
Pt has called to inform that she does not need the ARIPiprazole (ABILIFY) 5 MG tablet.  Pt does not feel the need to f/u with a psychiatrist either.Pt states things are much better, she would like a call from RN

## 2019-01-12 ENCOUNTER — Other Ambulatory Visit: Payer: Medicare Other

## 2019-01-17 ENCOUNTER — Other Ambulatory Visit: Payer: Self-pay | Admitting: *Deleted

## 2019-01-17 MED ORDER — ARIPIPRAZOLE 5 MG PO TABS: 5.0000 mg | ORAL_TABLET | Freq: Every day | ORAL | 0 refills | Status: DC

## 2019-01-17 NOTE — Telephone Encounter (Signed)
Pt is requesting a call back to discuss Abilify and her Zoloft

## 2019-01-17 NOTE — Telephone Encounter (Signed)
Pt called back stating she has questions about the Abilify that she would like to discuss with the RN. Please advise.

## 2019-01-17 NOTE — Telephone Encounter (Signed)
Called and spoke with pt. She is willing to try Abilify 5mg  tablet po daily now. Does not feel Zoloft alone is helping.  Dr. Felecia Shelling ok with this. She can stay on zoloft for now while starting Abilify. Advised rx was sent for Abilify 12/30/2018 #30 to [facility]. She will call pharamacy. She is not sure if she picked it up or not. She will call back if anything further needed.

## 2019-01-17 NOTE — Telephone Encounter (Signed)
Called pt back. She spoke with pharmacy. She picked up rx Abilify 12/31/18 and she threw it away because she initially did not want to take it. I re-sent rx. She will plan on starting it around 01/31/19 when she can get insurance to cover the next refill. She did not want to go goodrx route/pay cash to start med now. Advised her to call back if she has further questions.

## 2019-01-24 ENCOUNTER — Telehealth: Payer: Self-pay | Admitting: Neurology

## 2019-01-24 MED ORDER — SOLIFENACIN SUCCINATE 5 MG PO TABS: 5.0000 mg | ORAL_TABLET | Freq: Every day | ORAL | 11 refills | Status: DC

## 2019-01-24 NOTE — Telephone Encounter (Signed)
Dr. Sater- any recommendations? 

## 2019-01-24 NOTE — Telephone Encounter (Signed)
Pt is needing to discuss her medications with the RN. Pt would like to also know if there is something else she can take in the place of the mirabegron ER (MYRBETRIQ) 25 MG TB24 tablet because she can no longer take this due to it drying her out to much and giving her nose bleeds. Please advise.

## 2019-01-24 NOTE — Telephone Encounter (Signed)
Called LVM for pt returning her call.

## 2019-01-24 NOTE — Telephone Encounter (Signed)
Myrbetriq usually dries out mucouc membranes less than the other medications  We can try low dose vesicare 5 mg daily to see if better (#30 #11) if she want s to switch

## 2019-01-24 NOTE — Telephone Encounter (Signed)
Called and spoke with pt. Relayed Dr. Garth Bigness recommendation. She is agreeable to switch to vesicare and stop myrbetriq. I escribed vesicare and asked that myrbetriq be d/c'd.

## 2019-01-26 ENCOUNTER — Telehealth: Payer: Self-pay | Admitting: Neurology

## 2019-01-26 MED ORDER — BUSPIRONE HCL 15 MG PO TABS: ORAL_TABLET | ORAL | 3 refills | Status: DC

## 2019-01-26 NOTE — Telephone Encounter (Signed)
Dr. Sater- do you have any recommendations? 

## 2019-01-26 NOTE — Addendum Note (Signed)
Addended by: Hope Pigeon on: 01/26/2019 01:33 PM   Modules accepted: Orders

## 2019-01-26 NOTE — Telephone Encounter (Signed)
Mostly will tolerate low-dose Abilify well.  She should stop it since she does not feel good.  We can start BuSpar 15 mg twice daily (do only 1/2 pill twice a day for the first week) if this does not help her now then she needs to see psychiatry.

## 2019-01-26 NOTE — Telephone Encounter (Signed)
Pt states the ARIPiprazole (ABILIFY) 5 MG tablet makes her shaky on the inside and out, she doesn't feel very well on it.  Pt wants RN to talk to Dr Felecia Shelling about another medication.

## 2019-01-31 NOTE — Telephone Encounter (Signed)
Pt called in and stated after reading up on Buspar she doesn't want to take it, she states if any questions are needed please call.

## 2019-02-01 ENCOUNTER — Telehealth: Payer: Self-pay | Admitting: Neurology

## 2019-02-01 NOTE — Addendum Note (Signed)
Addended by: Hope Pigeon on: 02/01/2019 01:29 PM   Modules accepted: Orders

## 2019-02-01 NOTE — Telephone Encounter (Addendum)
Called pt back. She did not start buspar. She is going to hold off on this. States zoloft helping. She will call back if anything changes. I took buspar off med list.   She plans to have MRI asap but right now she is having house work done and she is going to plan on having MRI after this is done.

## 2019-02-01 NOTE — Telephone Encounter (Signed)
Phone rep checked office voicemails;at 11:20 pt left voicemail stating she spoke with RN Terrence Dupont earlier and is asking for another call from just RN Terrence Dupont.  Please call

## 2019-02-01 NOTE — Telephone Encounter (Signed)
I called pt about her  buspar medication. PT stated the zoloft medication is helping her and she feels the buspar is not needed.

## 2019-02-01 NOTE — Telephone Encounter (Signed)
I already spoke with pt about 20 min ago.

## 2019-02-03 ENCOUNTER — Telehealth: Payer: Self-pay | Admitting: Neurology

## 2019-02-03 NOTE — Telephone Encounter (Signed)
Ok for her to do that

## 2019-02-03 NOTE — Telephone Encounter (Signed)
Called and spoke with pt. She has decided she will try buspar. She will call back if she has any further questions/concerns.

## 2019-02-03 NOTE — Telephone Encounter (Signed)
Dr. Felecia Shelling- are you ok with this? She has changed her mind multiple times now.

## 2019-02-03 NOTE — Telephone Encounter (Signed)
Patient called to advise that after further consideration she will continue taking the buspar however, she has discarded her 1st fill and insurance will not cover for her to get it refilled until the 1st week of December. Patient states she will resume taking then. Please follow up .

## 2019-02-21 NOTE — Telephone Encounter (Signed)
Dr. Sater- please advise 

## 2019-02-21 NOTE — Telephone Encounter (Signed)
Have her cut the dose down to 1/2 pill twice a day.

## 2019-02-21 NOTE — Telephone Encounter (Signed)
Patient called wanting to know if she could speak to someone in regards to the [place]. Patient states she is feeling slight shaking more inside.  Patient is unsure if it could be related to her medication or BP. Please follow up.

## 2019-02-21 NOTE — Telephone Encounter (Signed)
Called pt back. Relayed Dr. Garth Bigness recommendation. She started medication today. She took 1/2 tablet this morning and will take 1/2 tablet this evening. She will do this for one week and if she tolerates well, she will increase to 1 tablet twice daily thereafter. She will call back if any further questions/concerns.

## 2019-04-12 ENCOUNTER — Telehealth: Payer: Self-pay

## 2019-04-12 NOTE — Telephone Encounter (Signed)
Called, LVM for pt to call office. 

## 2019-04-12 NOTE — Telephone Encounter (Signed)
Called pt back. She states she does not want to become addicted to medications. She feels she takes a lot of medication. She is taking zoloft 100mg  po qam and buspar 15mg  po BID (she has been on this about two months or more). Wanting to know if she can decrease either of these. Advised I will send message to Dr. Felecia Shelling and call her back tomorrow.

## 2019-04-12 NOTE — Telephone Encounter (Signed)
1) Medication(s) Requested (by name): sertraline (ZOLOFT) 100 MG tablet  busPIRone (BUSPAR) 15 MG tablet   2) Pharmacy of Choice:  3) Special Requests:   Patient would like to know if she can lessen her dose or what the situation is with these medications

## 2019-04-12 NOTE — Telephone Encounter (Signed)
She can cut the buspar in 1/2 (1/2 pill twice a day) for a week then stop

## 2019-04-13 NOTE — Telephone Encounter (Signed)
Called and spoke with patient. Relayed Dr. Garth Bigness recommendation. Per Dr. Felecia Shelling, she would like to decrease buspar to 1/2 tab po BID. Dr. Felecia Shelling approved this. I updated med list. She will call back if she has any further questions/concerns.

## 2019-04-13 NOTE — Telephone Encounter (Signed)
Patient called back due to VM please follow up

## 2019-05-07 ENCOUNTER — Other Ambulatory Visit: Payer: Medicare Other

## 2019-05-16 ENCOUNTER — Other Ambulatory Visit: Payer: Self-pay | Admitting: Neurology

## 2019-06-09 ENCOUNTER — Telehealth: Payer: Self-pay | Admitting: Neurology

## 2019-06-09 MED ORDER — ALPRAZOLAM 0.5 MG PO TABS: ORAL_TABLET | ORAL | 0 refills | Status: DC

## 2019-06-09 NOTE — Telephone Encounter (Signed)
Meds ordered this encounter  Medications  . ALPRAZolam (XANAX) 0.5 MG tablet    Sig: Take one or two tablets by mouth before MRI. Must have driver. Can cause drowsiness.    Dispense:  1 tablet    Refill:  0    Pt has 1 tablet left at home. This rx is for 1 tab only    Penni Bombard, MD [id number], 0000000 PM Certified in Neurology, Neurophysiology and Neuroimaging  [facility] [address], [place], [place] [address] [phone]

## 2019-06-09 NOTE — Telephone Encounter (Signed)
Pt has called stating she is in need of 1 of the pills that were called in for her to take before her MRI.  Pt states on the last time she was scheduled she took 1 of the pills (ALPRAZolam (XANAX) 0.5 MG tablet) [facility] all to be told she was not scheduled that day.  Please call in 1 pill, pt wants Terrence Dupont, RN to know she has requested PT, she is doing it twice a week.

## 2019-06-09 NOTE — Telephone Encounter (Addendum)
Clarified with Lattie Haw. Pt has been doing PT for about a week now. No call back necessary.   Pt has MRI brain scheduled for 06/18/19. Checked drug registry. She last refilled xanax 12/20/18 #2. Has f/u 06/20/19.

## 2019-06-09 NOTE — Addendum Note (Signed)
Addended by: Wyvonnia Lora on: 06/09/2019 03:10 PM   Modules accepted: Orders

## 2019-06-09 NOTE — Addendum Note (Signed)
Addended by: Andrey Spearman R on: 06/09/2019 04:53 PM   Modules accepted: Orders

## 2019-06-16 ENCOUNTER — Encounter: Payer: Self-pay | Admitting: *Deleted

## 2019-06-18 ENCOUNTER — Ambulatory Visit
Admission: RE | Admit: 2019-06-18 | Discharge: 2019-06-18 | Disposition: A | Discharge status: Home / Self Care | Payer: Medicare Other | Source: Ambulatory Visit | Attending: Neurology | Admitting: Neurology

## 2019-06-18 DIAGNOSIS — G35 Multiple sclerosis: Secondary | ICD-10-CM | POA: Diagnosis not present

## 2019-06-20 ENCOUNTER — Encounter: Payer: Self-pay | Admitting: Neurology

## 2019-06-20 ENCOUNTER — Other Ambulatory Visit: Payer: Self-pay

## 2019-06-20 ENCOUNTER — Telehealth: Payer: Self-pay | Admitting: *Deleted

## 2019-06-20 ENCOUNTER — Ambulatory Visit (INDEPENDENT_AMBULATORY_CARE_PROVIDER_SITE_OTHER): Payer: Medicare Other | Admitting: Neurology

## 2019-06-20 VITALS — BP 137/77 | HR 56 | Temp 97.5°F | Ht 62.0 in | Wt 153.0 lb

## 2019-06-20 DIAGNOSIS — F418 Other specified anxiety disorders: Secondary | ICD-10-CM

## 2019-06-20 DIAGNOSIS — G35 Multiple sclerosis: Secondary | ICD-10-CM | POA: Diagnosis not present

## 2019-06-20 DIAGNOSIS — R269 Unspecified abnormalities of gait and mobility: Secondary | ICD-10-CM

## 2019-06-20 DIAGNOSIS — G43009 Migraine without aura, not intractable, without status migrainosus: Secondary | ICD-10-CM | POA: Diagnosis not present

## 2019-06-20 DIAGNOSIS — R5383 Other fatigue: Secondary | ICD-10-CM

## 2019-06-20 DIAGNOSIS — M5481 Occipital neuralgia: Secondary | ICD-10-CM | POA: Diagnosis not present

## 2019-06-20 DIAGNOSIS — G4719 Other hypersomnia: Secondary | ICD-10-CM

## 2019-06-20 NOTE — Telephone Encounter (Signed)
-----   Message from Britt Bottom, MD sent at 06/19/2019  4:13 PM EDT ----- Please let her know the MRI showed nothing new compared to the 2018 MRI

## 2019-06-20 NOTE — Progress Notes (Signed)
[facility]  PATIENT: [name] DOB: Jun 27, 1955  REFERRING DOCTOR OR PCP:  Dr. Leta Baptist  SOURCE: patient, MRI images on PACS, imaging reports, notes in EPIC and labs in EPIC  _________________________________   HISTORICAL  CHIEF COMPLAINT:  Chief Complaint  Patient presents with  . Follow-up    RM 12, alone. Last seen 12/20/2018. Had a fall the other day, landed on left hip. denies any injuries. Ambulating with rolling walker.  Denies any new sypmtoms. Came by [employer] transportation.  . Multiple Sclerosis    off DMT  . Over Active Bladder    Takes vesicare  . Mood    Takes buspar, zoloft     HISTORY OF PRESENT ILLNESS:  [name] is a 64 y.o. woman with relapsing remitting multiple sclerosis diagnosed in 1983  Update 06/20/2019: She has been stable off of Betaseron.   She had an MRI a few days ago.  I personally reviewed her brain MRI.   It shows extensive T2/FLAIR hyperintense foci in the hemispheres and a few foci in the pons and cerebellum in a pattern and configuration consistent with chronic demyelinating plaque associated with multiple sclerosis.  There could also be superimposed foci of chronic microvascular ischemic change.  She feels her gait is the same.  Balance is off and she uses a walker.   She had one fall.   She has urinary frequency/urgency.   Solifenacin did not help the bladder at all and made her mouth dry.  She has some anxiety but feels it is doing better.   She takes sertraline and buspar and is much better on the combo than just zoloft.   She has reduced short term memory and reduced attention  She notes some neck pain and tightness.    She has occipital tenderness.  An ONB helped only one day.    Tylenol helps some and she takes bid.    Tramadol qd and baclofen bid has helped slightly.    Update 12/20/18 After the last visit, she stopped the Betaseron.    She had had stable MRI's and had MS > 35 years with no relapses at least 10-15  years.    She is ambulating with a walker outside her home and a cane in her home.    No recent falls.  She is noting more anxiety.   She notes she has a tremor inside and in her arms when she is anxious.   She has some depression too but anxiety bothers her more.    She is on duloxetine 20 mg po bid.    She has insomnia some nights and takes 1/2 trazodone if she has trouble initiating sleep.      She has more urge incontinence in general (worse a few weeks ago, closer to baseline now).    She was treated for a UTI.    In the past, she had a very dry mouth with oxybutynin.  Vision is cloudy and she will be doing cataract surgery soon.         Update 04/16/2018: She feels her MS is stable.  She is on Betaseron and she tolerates it well.   She has been using a cane x 15 years but uses a walker out of the home.   She can go about [phone] feet with her walker.    She actually feels she is doing better than a couple years ago.   There is no weakness.  She has spasticity, helped  by baclofen.    She denies any numbness or dysesthesias.  She has urinary frequency but her pattern is stable times years.  She notes mild fatigue.    She is having headaches worse in the occiput at night, right a little more than left   She can't lay flat on her pillow.   She is on gabapentin 300 am and 600 mg qHS.  Motrin 600 mg (takes 1-3 times a week) helps 4-6 hours.   Sleep is variable, sometimes worse due to pain.  Update 09/30/2017: She feels her MS has been stable and denies any new symptom or exacerbation.  Her gait is stable.    She uses a walker more for balance than strength. She also uses a cane she furniture surfs or uses a cane.     With her walker, she can go a couple hundred feet.   Arm and and strength is good.   She has some spasticity helped by baclofen 01-01-39.     She has no change in sensation.   She has urinary freq/urgency and has some incontinence so she wears a pad.     Her left eye twitches  intermittently.  Sometimes she feels her eyes are itchy before the twitching starts. She also feels the eyes are dry at times.  The twitching is only below the eye.    There is no nystagmus or ptosis.     Mood is doing very well.    She has been on Cymbalta many years and we discussed stopping it.    Update 04/02/2017: [name] continues on Betaseron. She is tolerating the injections well. She has not had any recent exacerbations.   The MRIs of the brain 10/28/2016 showed extensive lesions in the brainstem, cerebellum and the hemispheres consistent with MS. There were no acute/enhancing lesions. When compared to the MRI from 2015, there were no new lesions.  She had an URI last month followed by a GI virus.   She had diarrhea and did have one episode of fecal incontinence.   She continues to have some difficulties with her gait due to poor balance. She uses a cane or walker depending on how far she is going. Spasticity in the legs is helped by baclofen by baclofen sometimes makes her sleepy.   Current dose is 20-20-20.      She has dysesthesias (pinprivk sensation) all over at times and she has a warm sensation in her calf at night.    She is unsure if she is still on duloxetine.   She has urinary frequency and urgency.   Vision is ok but she notes colors are washed out out of her left eye.    Mood is doing well and she denies current depression or anxiety.     She notes mild cognitive issues but does well with writing lists and is independent.   She balances her checkbook.   Sleep is ok with trazodone.    She has some fatigue.     Her headaches are doing well and they occur less frequently.        From 09/30/2016: She is experiencing daily headache  MS:   She is currently on Betaseron. She was She tolerates the injections well. She denies any exacerbation and last MRI did not show any new changes.  HA/neck pain:   She is having fewer HA's and they are no longer daily.    She continues to have  neck pain. Certain movement of her  neck increase the pain.     Tylenol has not helped. Ice helps a little bit.  She has had headaches like this in the past and Fioricet has helped some.   A splenius capitus/occipitla nerve injection only helped a little bit.    Gait/strength/sensation:    She feels gait is the same.   Gait issues are mostly due to poor balance.    She uses a cane for short walks and a walker for longer distances.  She has muscle spasticity in legs, helped by baclofen.     Dysesthesias are helped by gabapentin but she takes just 300 mg nightly.   She does not note any significant numbness weakness or clumsiness in the arms.  Bladder/bowel: She has urinary frequency and urgency.She has incontinence at times and needs to use a pad.   She has 1 x nocturia.    She has constipation.  Vision:   She had optic neuritis in the past but has no sequela of it and her ophthalmologist told her that the eye look good.   She wears glasses.    Fatigue/sleep: She reports some fatigue and also notes more sleepiness tha at the last visit.  Pysical fatoigue is worse when she is more active.  She has difficulty with sleep maintenance insomnia more than with sleep onset. She snores and has rarely woken up gasping for air.  Mood/cognition: She denies any depression or anxiety.  She notes minimal cognitive difficulty. Specifically, she will sometimes have trouble coming up with the right words she feels the cognitive function is stable.  MS History:    In 1983, while at work, she was having difficulty with her gait and was unsteady and falling. She R doctor and was referred to Dr. Morrell Riddle of [facility] neurology who diagnosed her with multiple sclerosis. She was in the Betaseron drug study (was on placebo) with Dr. Terie Purser in [place]. When Betaseron became available in 1992 or 1993 she started the medication and was seen Dr. Erling Cruz. Over the next few years she had a couple exacerbations that involved leg function,  gait or balance. She had some treatments with IV steroids. She also has had some slow worsening with her gait more recently. Her last MRI of the brain and MRI of the cervical spine was performed in 2015. There were no acute lesions but there was a fair plaque burden in the brain and several foci in the cervical spine.   EPWORTH SLEEPINESS SCALE  On a scale of 0 - 3 what is the chance of dozing:  Sitting and Reading:   3 Watching TV:    3 Sitting inactive in a public place: 2 Passenger in car for one hour: 0 Lying down to rest in the afternoon: 3 Sitting and talking to someone: 0 Sitting quietly after lunch:  3 In a car, stopped in traffic:  0  Total (out of 24):    14/24   MRI Review:   I personally reviewed the MRI of the brain and cervical spine from 07/25/2013. In the brain, there are extensive T2/FLAIR hyperintense foci involving the cerebellum, left posterior medulla, there was a normal enhancement pattern. The cervical spine showed hyperintense foci adjacent to C3, C5-C6, C6, and T1. Right posterior pons, bilateral hemispheres, predominantly in the periventricular cortical white matter.   None of these enhanced.   REVIEW OF SYSTEMS: Constitutional: No fevers, chills, sweats, or change in appetite.  Fatigue and sleepiness.  She has maintenance insomnia Eyes: No visual changes,  double vision, eye pain.   Some fatigue. She has insomnia helped by trazodone. Ear, nose and throat: No hearing loss, ear pain, nasal congestion, sore throat Cardiovascular: No chest pain, palpitations Respiratory: No shortness of breath at rest or with exertion.   No wheezes.   She snores GastrointestinaI: No nausea, vomiting, diarrhea, abdominal pain, fecal incontinence Genitourinary: as above Musculoskeletal: No neck pain, back pain Integumentary: No rash, pruritus, skin lesions Neurological: as above Psychiatric: No depression at this time.  No anxiety Endocrine: No palpitations, diaphoresis,  change in appetite, change in weigh or increased thirst Hematologic/Lymphatic: No anemia, purpura, petechiae. Allergic/Immunologic: No itchy/runny eyes, nasal congestion, recent allergic reactions, rashes  ALLERGIES: Allergies  Allergen Reactions  . Other Other (See Comments)    Pt states she took a medication 2-3 yrs ago that may have been for inflammation and it caused her to be weak.  Marland Kitchen Myrbetriq [Mirabegron]     Caused her to dry out/nose bleeds    HOME MEDICATIONS:  Current Outpatient Medications:  .  acetaminophen (TYLENOL) 500 MG tablet, Take 500 mg by mouth every 6 (six) hours as needed., Disp: , Rfl:  .  amLODipine (NORVASC) 10 MG tablet, Take 10 mg by mouth daily.  , Disp: , Rfl:  .  atorvastatin (LIPITOR) 20 MG tablet, Take 20 mg by mouth daily.  , Disp: , Rfl:  .  baclofen (LIORESAL) 20 MG tablet, Take 2 tablets by mouth at bedtime, Disp: 60 tablet, Rfl: 5 .  busPIRone (BUSPAR) 15 MG tablet, Take 0.5 tablets (7.5 mg total) by mouth 2 (two) times daily., Disp: 30 tablet, Rfl: 3 .  fluticasone (FLONASE) 50 MCG/ACT nasal spray, Place 2 sprays into the nose daily. For stuffy nose. , Disp: , Rfl:  .  gabapentin (NEURONTIN) 300 MG capsule, Take one capsule at dinner and one at bedtime. (FOR NERVE PAIN/NEUROPATHY), Disp: 60 capsule, Rfl: 5 .  ibuprofen (ADVIL,MOTRIN) 600 MG tablet, Take 1 tablet by mouth every 8 (eight) hours as needed for mild pain or moderate pain. , Disp: , Rfl:  .  losartan-hydrochlorothiazide (HYZAAR) 100-25 MG tablet, Take 1 tablet by mouth daily., Disp: , Rfl:  .  montelukast (SINGULAIR) 10 MG tablet, Take 10 mg by mouth at bedtime.  , Disp: , Rfl:  .  NEXIUM 40 MG capsule, Take 40 mg by mouth daily., Disp: , Rfl:  .  nystatin (NYAMYC) powder, Apply topically 4 (four) times daily., Disp: , Rfl:  .  ondansetron (ZOFRAN) 4 MG tablet, Take 4 mg by mouth every 8 (eight) hours as needed for nausea or vomiting., Disp: , Rfl:  .  potassium chloride (K-DUR) 10 MEQ  tablet, Take 10 mEq by mouth daily., Disp: , Rfl:  .  sertraline (ZOLOFT) 100 MG tablet, Take 1 tablet (100 mg total) by mouth daily., Disp: 30 tablet, Rfl: 11 .  traMADol (ULTRAM) 50 MG tablet, Take 50 mg by mouth every 8 (eight) hours as needed for severe pain. , Disp: , Rfl:  .  traZODone (DESYREL) 100 MG tablet, Take 100 mg by mouth at bedtime.  , Disp: , Rfl:   PAST MEDICAL HISTORY: Past Medical History:  Diagnosis Date  . Anemia    HX  . Anxiety   . Arthritis   . Chronic kidney disease    UTI'S  . Complication of anesthesia    BP RISES WHEN AWAKENING  . Complication of anesthesia    "difficult waking up and can become combative"   . GERD (  gastroesophageal reflux disease)   . H/O hiatal hernia   . Headache(784.0)   . Heart murmur   . Hypertension   . Lumbar spinal stenosis   . Lumbar spondylolysis   . MS (multiple sclerosis) ([place])   . Neuromuscular disorder ([place])    MS  . Urinary frequency   . Wears glasses     PAST SURGICAL HISTORY: Past Surgical History:  Procedure Laterality Date  . CHOLECYSTECTOMY    . EYE SURGERY     BLEPHAROPLASTY  . LUMBAR FUSION  05/10/2015  . POSTERIOR FUSION LUMBAR SPINE  04/16/11   L4-5  . TRANSFORAMINAL LUMBAR INTERBODY FUSION (TLIF) WITH PEDICLE SCREW FIXATION 1 LEVEL N/A 05/10/2015   Procedure: TRANSFORAMINAL LUMBAR INTERBODY FUSION (TLIF) WITH PEDICLE SCREW FIXATION L3-4;  Surgeon: Eustace Moore, MD;  Location: [facility];  Service: Neurosurgery;  Laterality: N/A;  TRANSFORAMINAL LUMBAR INTERBODY FUSION (TLIF) WITH PEDICLE SCREW FIXATION L3-4  . UTERINE FIBROID EMBOLIZATION      FAMILY HISTORY: Family History  Problem Relation Age of Onset  . Lung cancer Mother   . Emphysema Father   . Heart disease Unknown     SOCIAL HISTORY:  Social History   Socioeconomic History  . Marital status: Single    Spouse name: Not on file  . Number of children: 0  . Years of education: 75  . Highest education level: Not on file    Occupational History    Comment: Does not work - Disabled  Tobacco Use  . Smoking status: Former Smoker    Packs/day: 2.00    Quit date: 04/08/1996    Years since quitting: 23.2  . Smokeless tobacco: Never Used  . Tobacco comment: ALCOHOL IN PAST  Substance and Sexual Activity  . Alcohol use: No  . Drug use: No  . Sexual activity: Yes    Birth control/protection: Post-menopausal  Other Topics Concern  . Not on file  Social History Narrative   Patient does not work she is disabled. and lives at home. Patient is single.   Caffeine: 1-2 cups daily depending on the day   Right handed.   Social Determinants of Health   Financial Resource Strain:   . Difficulty of Paying Living Expenses:   Food Insecurity:   . Worried About Charity fundraiser in the Last Year:   . Arboriculturist in the Last Year:   Transportation Needs:   . Film/video editor (Medical):   Marland Kitchen Lack of Transportation (Non-Medical):   Physical Activity:   . Days of Exercise per Week:   . Minutes of Exercise per Session:   Stress:   . Feeling of Stress :   Social Connections:   . Frequency of Communication with Friends and Family:   . Frequency of Social Gatherings with Friends and Family:   . Attends Religious Services:   . Active Member of Clubs or Organizations:   . Attends Archivist Meetings:   Marland Kitchen Marital Status:   Intimate Partner Violence:   . Fear of Current or Ex-Partner:   . Emotionally Abused:   Marland Kitchen Physically Abused:   . Sexually Abused:      PHYSICAL EXAM  Vitals:   06/20/19 0944  BP: 137/77  Pulse: (!) 56  Temp: (!) 97.5 F (36.4 C)  Weight: 153 lb (69.4 kg)  Height: 5\' 2"  (1.575 m)    Body mass index is 27.98 kg/m.    General: The patient is well-developed and well-nourished and  in no acute distress  Skin: Extremities are without rash or edema.  Neurologic Exam  Mental status: The patient is alert and oriented x 3 at the time of the examination. The patient  has apparent normal recent and remote memory, with an apparently normal attention span and concentration ability.   Speech is normal.  Cranial nerves: Extraocular movements are full.  Facial strength and sensation was normal.  Trapezius strength is normal.  No obvious hearing deficits are noted.  Motor:  Muscle bulk is normal.  Muscle tone is increased in the legs.  Strength was 5/5  Sensory: Normal touch and vibration sensation in limbs.    Coordination: She has good finger-nose-finger bilaterally.  Heel-to-shin is mildly reduced  Gait and station: Station is normal.  Gait is mildly wide and she is unable to do tandem walk.  She can take some steps without a cane..  Romberg is borderline.   Reflexes: Deep tendon reflexes are symmetric and normal bilaterally.       ASSESSMENT AND PLAN  Multiple sclerosis (HCC)  Migraine without aura and without status migrainosus, not intractable  Bilateral occipital neuralgia  Gait disturbance  Other fatigue  Excessive daytime sleepiness  Depression with anxiety  1.   She will continue off Betaseron.  Her MRIs have been stable off of Betaseron.  Consider rechecking the brain MRI again in about a year and start a disease modifying therapy if any changes.   2.   Continue sertraline and BuSpar for mood and tramadol and gabapentin as needed.   3,  Continue baclofen for spasticity 4,    Continue other medications.  She will return in 6 months or sooner if there are new or worsening neurologic symptoms.   Aalyssa Elderkin A. Felecia Shelling, MD, PhD [id number], [id number] PM Certified in Neurology, Clinical Neurophysiology, Sleep Medicine, Pain Medicine and Neuroimaging  [facility] [address] [place], [place] [address] [phone]

## 2019-06-20 NOTE — Telephone Encounter (Signed)
Called pt and relayed results per Dr. Felecia Shelling note. She verbalized understanding.

## 2019-08-03 ENCOUNTER — Telehealth: Payer: Self-pay | Admitting: Neurology

## 2019-08-03 DIAGNOSIS — G35 Multiple sclerosis: Secondary | ICD-10-CM

## 2019-08-03 DIAGNOSIS — R269 Unspecified abnormalities of gait and mobility: Secondary | ICD-10-CM

## 2019-08-03 MED ORDER — SOLIFENACIN SUCCINATE 10 MG PO TABS: 10.0000 mg | ORAL_TABLET | Freq: Every day | ORAL | 5 refills | Status: DC

## 2019-08-03 NOTE — Telephone Encounter (Signed)
Reviewed pt chart. Looks like she has tried [employer] before but listed on allergies (she reported it dried her out/caused nose bleeds). She has previously been on Vesicare 5mg  po qd as well. Will speak with MD to see what he recommends.

## 2019-08-03 NOTE — Telephone Encounter (Addendum)
Spoke with Dr. Felecia Shelling. He recommends Vesicare 10mg  po qd. #30, 5 refills. I called pt. She is agreeable to try this. I e-scribed to [facility]. She states they deliver to her M,W,Fri. They will most likely deliver this to her on Friday.  She also reported she is slipping/falling more. She ended PT early April. She would like new referral to [facility]. Phone:  [phone]. She was walking better/had more strength when she was doing PT. Advised I will place this referral for her.

## 2019-08-03 NOTE — Telephone Encounter (Signed)
Phone rep checked office voicemail's,at 8:38 pt left message asking that Dr Felecia Shelling puts her back on the medication for her frequent urinating.  Please call, pt does not recall the name of the medication.

## 2019-08-03 NOTE — Addendum Note (Signed)
Addended by: Wyvonnia Lora on: 08/03/2019 11:54 AM   Modules accepted: Orders

## 2019-08-20 ENCOUNTER — Other Ambulatory Visit: Payer: Self-pay | Admitting: Neurology

## 2019-09-06 ENCOUNTER — Telehealth: Payer: Self-pay | Admitting: Neurology

## 2019-09-06 NOTE — Telephone Encounter (Signed)
Pt returned call, left a VM with office asking that [facility] call her back at [phone].

## 2019-09-06 NOTE — Telephone Encounter (Signed)
Pt is asking for a call from RN to discuss a referral for physical therapy.  Pt states for personal reasons she no longer goes to [facility].  Please call

## 2019-09-06 NOTE — Telephone Encounter (Signed)
I called pt. No answer, left a message asking pt to call me back.  Pt stated in previous message she would prefer not to go to [facility]. I requested a call back from the pt to verify is he had a preferred location for order to be sent to.

## 2019-09-07 NOTE — Telephone Encounter (Signed)
Pt called back and we further discussed this message. After discussing pt agreed to using [facility] for physical therapy.   Pt sts she has not heard from them yet and referral was sent on 08/03/19. Will send to referral coordinator to f/u.

## 2019-09-08 NOTE — Telephone Encounter (Signed)
Called and spoke to [name]  Patient Has CX 6 times patient needs to call and schedule . I called and relayed this patient she understood .

## 2019-09-16 ENCOUNTER — Telehealth: Payer: Self-pay | Admitting: Neurology

## 2019-09-16 NOTE — Telephone Encounter (Signed)
Pt called wanting to know if she would be able to have her sertraline (ZOLOFT) 100 MG tablet called in as a 90 day supply to the [facility] Please advise.

## 2019-09-19 MED ORDER — SERTRALINE HCL 100 MG PO TABS: 100.0000 mg | ORAL_TABLET | Freq: Every day | ORAL | 1 refills | Status: DC

## 2019-09-19 MED ORDER — BUSPIRONE HCL 15 MG PO TABS: ORAL_TABLET | ORAL | 1 refills | Status: DC

## 2019-09-19 NOTE — Telephone Encounter (Signed)
Patient returned [name]'s call. I advised her that 90 day supply of Zoloft was sent to [facility]. She also asked about 90 day supply of Buspar. FYI

## 2019-09-19 NOTE — Telephone Encounter (Signed)
Pt called back and asked phone staff for a 90 day supply of buspar as well. I have sent this in.

## 2019-09-19 NOTE — Addendum Note (Signed)
Addended by: Lester Sycamore A on: 09/19/2019 08:24 AM   Modules accepted: Orders

## 2019-09-19 NOTE — Telephone Encounter (Signed)
90 day supply of zoloft sent to [facility]. I called pt to advise her of this. No answer, left a message asking her to call me back. If pt calls back please advise her of this.

## 2019-09-28 ENCOUNTER — Telehealth: Payer: Self-pay | Admitting: Neurology

## 2019-09-28 DIAGNOSIS — G35 Multiple sclerosis: Secondary | ICD-10-CM

## 2019-09-28 DIAGNOSIS — R531 Weakness: Secondary | ICD-10-CM

## 2019-09-28 DIAGNOSIS — R269 Unspecified abnormalities of gait and mobility: Secondary | ICD-10-CM

## 2019-09-28 NOTE — Telephone Encounter (Signed)
Pt has called to report that she is all of a sudden feeling weak, feeling unsteady and having to use her walker more and more.  Pt states this is very concerning considering she had been feeling fine.  Please call

## 2019-09-28 NOTE — Telephone Encounter (Signed)
Called, LVM returning pt call 

## 2019-09-29 ENCOUNTER — Other Ambulatory Visit: Payer: Self-pay | Admitting: Family Medicine

## 2019-09-29 DIAGNOSIS — Z1231 Encounter for screening mammogram for malignant neoplasm of breast: Secondary | ICD-10-CM

## 2019-09-29 NOTE — Addendum Note (Signed)
Addended by: Wyvonnia Lora on: 09/29/2019 04:41 PM   Modules accepted: Orders

## 2019-09-29 NOTE — Telephone Encounter (Signed)
Error

## 2019-09-29 NOTE — Telephone Encounter (Signed)
I called pt since no return call yet. She did not receive my message. I asked how PT was going. She states Conservation officer, nature  will no longer see her because she has cx too many times. She states some days she does not feel up to going. I expressed the importance of PT and they can help her with strength training. Pt agreeable to try PT again. She would like referral to [facility]. She is going to them sometime this month for a growth on her finger. Advised we will place referral for her. She verbalized understanding.

## 2019-10-03 ENCOUNTER — Telehealth: Payer: Self-pay

## 2019-10-03 NOTE — Telephone Encounter (Signed)
Called pt back. She was requesting referral to [facility]. Advised this was already placed last week. Confirmed referral was sent by Jayme Cloud. She should be contacted by their office to get scheduled. She verbalized understanding.

## 2019-10-03 NOTE — Telephone Encounter (Signed)
Pt left a VM asking for a call back today. She left no other details.

## 2019-10-05 ENCOUNTER — Telehealth: Payer: Self-pay

## 2019-10-05 NOTE — Telephone Encounter (Signed)
I called the patient back to discuss her medication. She confused the dosage and has been taking Buspar 15mg , one tab in am and 0.5 tab in the evening. Per vo by Dr. Felecia Shelling, if this has been helpful then he will provide a new prescription. The patient feels she actually does better on the lower dose of 0.5 tab BID and would like to continue it. She will run out of her prescription early and needs the pharmacy contacted. I spoke to [place] at [facility] who will make sure her treatment is not disrupted due to this error.

## 2019-10-05 NOTE — Telephone Encounter (Signed)
Pt called to inform Dr. Felecia Shelling that she just realized today that she has been taking the buspirone wrong. She has been taking a full tablet in the morning and 1/2 tablet in the afternoon. Today she realized that the bottle says to take 1/2 tablet BID. She is concerned and wonders if she should be worried or what should she do?

## 2019-11-07 ENCOUNTER — Ambulatory Visit: Payer: Medicare Other

## 2019-12-07 ENCOUNTER — Ambulatory Visit
Admission: RE | Admit: 2019-12-07 | Discharge: 2019-12-07 | Disposition: A | Discharge status: Home / Self Care | Payer: Medicare Other | Source: Ambulatory Visit | Attending: Family Medicine | Admitting: Family Medicine

## 2019-12-07 ENCOUNTER — Other Ambulatory Visit: Payer: Self-pay

## 2019-12-07 DIAGNOSIS — Z1231 Encounter for screening mammogram for malignant neoplasm of breast: Secondary | ICD-10-CM

## 2019-12-16 ENCOUNTER — Telehealth: Payer: Self-pay | Admitting: Neurology

## 2019-12-16 NOTE — Telephone Encounter (Signed)
Pt called, would like to discuss about medication causing difficulty with my mind. Would like a call from the nurse.

## 2019-12-19 ENCOUNTER — Encounter: Payer: Self-pay | Admitting: *Deleted

## 2019-12-19 NOTE — Telephone Encounter (Signed)
Called and left VM with patient.  Reminded patient she has a follow up in 2 days and can discuss any concerns she has with provider at that time.

## 2019-12-21 ENCOUNTER — Other Ambulatory Visit: Payer: Self-pay

## 2019-12-21 ENCOUNTER — Encounter: Payer: Self-pay | Admitting: Family Medicine

## 2019-12-21 ENCOUNTER — Ambulatory Visit (INDEPENDENT_AMBULATORY_CARE_PROVIDER_SITE_OTHER): Payer: Medicare Other | Admitting: Family Medicine

## 2019-12-21 VITALS — BP 124/72 | HR 61 | Ht 61.0 in | Wt 154.0 lb

## 2019-12-21 DIAGNOSIS — R269 Unspecified abnormalities of gait and mobility: Secondary | ICD-10-CM | POA: Diagnosis not present

## 2019-12-21 DIAGNOSIS — G43009 Migraine without aura, not intractable, without status migrainosus: Secondary | ICD-10-CM

## 2019-12-21 DIAGNOSIS — G35 Multiple sclerosis: Secondary | ICD-10-CM | POA: Diagnosis not present

## 2019-12-21 DIAGNOSIS — R3915 Urgency of urination: Secondary | ICD-10-CM

## 2019-12-21 DIAGNOSIS — R5383 Other fatigue: Secondary | ICD-10-CM | POA: Diagnosis not present

## 2019-12-21 DIAGNOSIS — R531 Weakness: Secondary | ICD-10-CM

## 2019-12-21 NOTE — Progress Notes (Signed)
PATIENT: [name] DOB: 1956/02/22  REASON FOR VISIT: follow up HISTORY FROM: patient  Chief Complaint  Patient presents with  . Follow-up    rm 2  . Multiple Sclerosis     HISTORY OF PRESENT ILLNESS: Today 12/21/19 [name] is a 64 y.o. female here today for follow up for RRMS.   She feels that she is doing ok. She does feel generalized weakness. She feels that legs are weaker. She is not as active. She was referred to PT. She reports that PT discharged her due to too many canceled visits. PCP has referred her to another group. She reports a couple of falls since last being seen. No injuries. She uses her cane in the home and her Rolator when she leaves home.   She continues baclofen and gabapentin for spasticity and neuropathy. She is unsure if it helps. Usually takes gabapentin one gabapentin and two baclofen at bedtime.   She does have urinary frequency. She continues Vesicare but uncertain if it works. She does have chronic constipation. She is drinking about 30 ounces of water daily.  She continues buspirone (1/2 tablet) in the morning and sertraline 100mg  daily. She feels mood is stable. She stopped second dose of buspirone as she did not feel she needed. PCP continues trazodone 100   HISTORY: (copied from Dr Garth Bigness note on 06/20/2019)  [name] is a 64 y.o. woman with relapsing remitting multiple sclerosis diagnosed in 1983  Update 06/20/2019: She has been stable off of Betaseron.   She had an MRI a few days ago.  I personally reviewed her brain MRI.   It shows extensive T2/FLAIR hyperintense foci in the hemispheres and a few foci in the pons and cerebellum in a pattern and configuration consistent with chronic demyelinating plaque associated with multiple sclerosis. There could also be superimposed foci of chronic microvascular ischemic change.  She feels her gait is the same.  Balance is off and she uses a walker.   She had one fall.   She has urinary  frequency/urgency.   Solifenacin did not help the bladder at all and made her mouth dry.  She has some anxiety but feels it is doing better.   She takes sertraline and buspar and is much better on the combo than just zoloft.   She has reduced short term memory and reduced attention  She notes some neck pain and tightness.    She has occipital tenderness.  An ONB helped only one day.    Tylenol helps some and she takes bid.    Tramadol qd and baclofen bid has helped slightly.    Update 12/20/18 After the last visit, she stopped the Betaseron.    She had had stable MRI's and had MS > 35 years with no relapses at least 10-15 years.    She is ambulating with a walker outside her home and a cane in her home.    No recent falls.  She is noting more anxiety.   She notes she has a tremor inside and in her arms when she is anxious.   She has some depression too but anxiety bothers her more.    She is on duloxetine 20 mg po bid.    She has insomnia some nights and takes 1/2 trazodone if she has trouble initiating sleep.      She has more urge incontinence in general (worse a few weeks ago, closer to baseline now).    She was  treated for a UTI.    In the past, she had a very dry mouth with oxybutynin.  Vision is cloudy and she will be doing cataract surgery soon.         Update 04/16/2018: She feels her MS is stable.  She is on Betaseron and she tolerates it well.   She has been using a cane x 15 years but uses a walker out of the home.   She can go about [phone] feet with her walker.    She actually feels she is doing better than a couple years ago.   There is no weakness.  She has spasticity, helped by baclofen.    She denies any numbness or dysesthesias.  She has urinary frequency but her pattern is stable times years.  She notes mild fatigue.    She is having headaches worse in the occiput at night, right a little more than left   She can't lay flat on her pillow.   She is on gabapentin 300 am  and 600 mg qHS.  Motrin 600 mg (takes 1-3 times a week) helps 4-6 hours.   Sleep is variable, sometimes worse due to pain.  Update 09/30/2017: She feels her MS has been stable and denies any new symptom or exacerbation.  Her gait is stable.    She uses a walker more for balance than strength. She also uses a cane she furniture surfs or uses a cane.     With her walker, she can go a couple hundred feet.   Arm and and strength is good.   She has some spasticity helped by baclofen 01-01-39.     She has no change in sensation.   She has urinary freq/urgency and has some incontinence so she wears a pad.     Her left eye twitches intermittently.  Sometimes she feels her eyes are itchy before the twitching starts. She also feels the eyes are dry at times.  The twitching is only below the eye.    There is no nystagmus or ptosis.     Mood is doing very well.    She has been on Cymbalta many years and we discussed stopping it.    Update 04/02/2017: [name] continues on Betaseron. She is tolerating the injections well. She has not had any recent exacerbations.   The MRIs of the brain 10/28/2016 showed extensive lesions in the brainstem, cerebellum and the hemispheres consistent with MS. There were no acute/enhancing lesions. When compared to the MRI from 2015, there were no new lesions.  She had an URI last month followed by a GI virus.   She had diarrhea and did have one episode of fecal incontinence.   She continues to have some difficulties with her gait due to poor balance. She uses a cane or walker depending on how far she is going. Spasticity in the legs is helped by baclofen by baclofen sometimes makes her sleepy.   Current dose is 20-20-20.      She has dysesthesias (pinprivk sensation) all over at times and she has a warm sensation in her calf at night.    She is unsure if she is still on duloxetine.   She has urinary frequency and urgency.   Vision is ok but she notes colors are washed out out  of her left eye.    Mood is doing well and she denies current depression or anxiety.     She notes mild cognitive issues but does well  with writing lists and is independent.   She balances her checkbook.   Sleep is ok with trazodone.    She has some fatigue.     Her headaches are doing well and they occur less frequently.        From 09/30/2016: She is experiencing daily headache  MS:   She is currently on Betaseron. She was She tolerates the injections well. She denies any exacerbation and last MRI did not show any new changes.  HA/neck pain:   She is having fewer HA's and they are no longer daily.    She continues to have neck pain. Certain movement of her neck increase the pain.     Tylenol has not helped. Ice helps a little bit.  She has had headaches like this in the past and Fioricet has helped some.   A splenius capitus/occipitla nerve injection only helped a little bit.    Gait/strength/sensation:    She feels gait is the same.   Gait issues are mostly due to poor balance.    She uses a cane for short walks and a walker for longer distances.  She has muscle spasticity in legs, helped by baclofen.     Dysesthesias are helped by gabapentin but she takes just 300 mg nightly.   She does not note any significant numbness weakness or clumsiness in the arms.  Bladder/bowel: She has urinary frequency and urgency.She has incontinence at times and needs to use a pad.   She has 1 x nocturia.    She has constipation.  Vision:   She had optic neuritis in the past but has no sequela of it and her ophthalmologist told her that the eye look good.   She wears glasses.    Fatigue/sleep: She reports some fatigue and also notes more sleepiness tha at the last visit.  Pysical fatoigue is worse when she is more active.  She has difficulty with sleep maintenance insomnia more than with sleep onset. She snores and has rarely woken up gasping for air.  Mood/cognition: She denies any depression or  anxiety.  She notes minimal cognitive difficulty. Specifically, she will sometimes have trouble coming up with the right words she feels the cognitive function is stable.  MS History:    In 1983, while at work, she was having difficulty with her gait and was unsteady and falling. She R doctor and was referred to Dr. Morrell Riddle of [facility] neurolog[facility] who diagnosed her with multiple sclerosis. She was in the Betaseron drug study (was on placebo) with Dr. Terie Purser in [place]. When Betaseron became available in 1992 or 1993 she started the medication and was seen Dr. Erling Cruz. Over the next few years she had a couple exacerbations that involved leg function, gait or balance. She had some treatments with IV steroids. She also has had some slow worsening with her gait more recently. Her last MRI of the brain and MRI of the cervical spine was performed in 2015. There were no acute lesions but there was a fair plaque burden in the brain and several foci in the cervical spine.   EPWORTH SLEEPINESS SCALE  On a scale of 0 - 3 what is the chance of dozing:  Sitting and Reading:                           3 Watching TV:  3 Sitting inactive in a public place:        2 Passenger in car for one hour:           0 Lying down to rest in the afternoon:   3 Sitting and talking to someone:          0 Sitting quietly after lunch:                   3 In a car, stopped in traffic:                  0  Total (out of 24):    14/24   MRI Review:   I personally reviewed the MRI of the brain and cervical spine from 07/25/2013. In the brain, there are extensive T2/FLAIR hyperintense foci involving the cerebellum, left posterior medulla, there was a normal enhancement pattern. The cervical spine showed hyperintense foci adjacent to C3, C5-C6, C6, and T1. Right posterior pons, bilateral hemispheres, predominantly in the periventricular cortical white matter.   None of these  enhanced.    REVIEW OF SYSTEMS: Out of a complete 14 system review of symptoms, the patient complains only of the following symptoms, chronic pain, imbalance, depression. Anxiety, headaches, and all other reviewed systems are negative.   ALLERGIES: Allergies  Allergen Reactions  . Other Other (See Comments)    Pt states she took a medication 2-3 yrs ago that may have been for inflammation and it caused her to be weak.  Edyth Gunnels [Mirabegron]     Caused her to dry out/nose bleeds    HOME MEDICATIONS: Outpatient Medications Prior to Visit  Medication Sig Dispense Refill  . acetaminophen (TYLENOL) 500 MG tablet Take 500 mg by mouth every 6 (six) hours as needed.    Marland Kitchen amLODipine (NORVASC) 10 MG tablet Take 10 mg by mouth daily.      Marland Kitchen atorvastatin (LIPITOR) 20 MG tablet Take 20 mg by mouth daily.      . baclofen (LIORESAL) 20 MG tablet Take 2 tablets by mouth at bedtime 60 tablet 5  . busPIRone (BUSPAR) 15 MG tablet TAKE 1/2 TABLET BY MOUTH 2 TIMES DAILY. 90 tablet 1  . fluticasone (FLONASE) 50 MCG/ACT nasal spray Place 2 sprays into the nose daily. For stuffy nose.     . gabapentin (NEURONTIN) 300 MG capsule Take one capsule at dinner and one at bedtime. (FOR NERVE PAIN/NEUROPATHY) 60 capsule 5  . ibuprofen (ADVIL,MOTRIN) 600 MG tablet Take 1 tablet by mouth every 8 (eight) hours as needed for mild pain or moderate pain.     Marland Kitchen losartan-hydrochlorothiazide (HYZAAR) 100-25 MG tablet Take 1 tablet by mouth daily.    . montelukast (SINGULAIR) 10 MG tablet Take 10 mg by mouth at bedtime.      Marland Kitchen NEXIUM 40 MG capsule Take 40 mg by mouth daily.    Marland Kitchen nystatin [facility]) powder Apply topically 4 (four) times daily.    . ondansetron (ZOFRAN) 4 MG tablet Take 4 mg by mouth every 8 (eight) hours as needed for nausea or vomiting.    . potassium chloride (K-DUR) 10 MEQ tablet Take 10 mEq by mouth daily.    . sertraline (ZOLOFT) 100 MG tablet Take 1 tablet (100 mg total) by mouth daily. 90 tablet 1   . solifenacin (VESICARE) 10 MG tablet Take 1 tablet (10 mg total) by mouth daily. 30 tablet 5  . traMADol (ULTRAM) 50 MG tablet Take 50 mg by mouth every 8 (  eight) hours as needed for severe pain.     . traZODone (DESYREL) 100 MG tablet Take 100 mg by mouth at bedtime.       No facility-administered medications prior to visit.    PAST MEDICAL HISTORY: Past Medical History:  Diagnosis Date  . Anemia    HX  . Anxiety   . Arthritis   . Chronic kidney disease    UTI'S  . Complication of anesthesia    BP RISES WHEN AWAKENING  . Complication of anesthesia    "difficult waking up and can become combative"   . GERD (gastroesophageal reflux disease)   . H/O hiatal hernia   . Headache(784.0)   . Heart murmur   . Hypertension   . Lumbar spinal stenosis   . Lumbar spondylolysis   . MS (multiple sclerosis) ([place])   . Neuromuscular disorder ([place])    MS  . Urinary frequency   . Wears glasses     PAST SURGICAL HISTORY: Past Surgical History:  Procedure Laterality Date  . CHOLECYSTECTOMY    . EYE SURGERY     BLEPHAROPLASTY  . LUMBAR FUSION  05/10/2015  . POSTERIOR FUSION LUMBAR SPINE  04/16/11   L4-5  . TRANSFORAMINAL LUMBAR INTERBODY FUSION (TLIF) WITH PEDICLE SCREW FIXATION 1 LEVEL N/A 05/10/2015   Procedure: TRANSFORAMINAL LUMBAR INTERBODY FUSION (TLIF) WITH PEDICLE SCREW FIXATION L3-4;  Surgeon: Eustace Moore, MD;  Location: [facility];  Service: Neurosurgery;  Laterality: N/A;  TRANSFORAMINAL LUMBAR INTERBODY FUSION (TLIF) WITH PEDICLE SCREW FIXATION L3-4  . UTERINE FIBROID EMBOLIZATION      FAMILY HISTORY: Family History  Problem Relation Age of Onset  . Lung cancer Mother   . Emphysema Father   . Heart disease Other     SOCIAL HISTORY: Social History   Socioeconomic History  . Marital status: Single    Spouse name: Not on file  . Number of children: 0  . Years of education: 16  . Highest education level: Not on file  Occupational History    Comment: Does not  work - Disabled  Tobacco Use  . Smoking status: Former Smoker    Packs/day: 2.00    Quit date: 04/08/1996    Years since quitting: 23.7  . Smokeless tobacco: Never Used  . Tobacco comment: ALCOHOL IN PAST  Substance and Sexual Activity  . Alcohol use: No  . Drug use: No  . Sexual activity: Yes    Birth control/protection: Post-menopausal  Other Topics Concern  . Not on file  Social History Narrative   Patient does not work she is disabled. and lives at home. Patient is single.   Caffeine: 1-2 cups daily depending on the day   Right handed.   Social Determinants of Health   Financial Resource Strain:   . Difficulty of Paying Living Expenses: Not on file  Food Insecurity:   . Worried About Charity fundraiser in the Last Year: Not on file  . Ran Out of Food in the Last Year: Not on file  Transportation Needs:   . Lack of Transportation (Medical): Not on file  . Lack of Transportation (Non-Medical): Not on file  Physical Activity:   . Days of Exercise per Week: Not on file  . Minutes of Exercise per Session: Not on file  Stress:   . Feeling of Stress : Not on file  Social Connections:   . Frequency of Communication with Friends and Family: Not on file  . Frequency of Social Gatherings  with Friends and Family: Not on file  . Attends Religious Services: Not on file  . Active Member of Clubs or Organizations: Not on file  . Attends Archivist Meetings: Not on file  . Marital Status: Not on file  Intimate Partner Violence:   . Fear of Current or Ex-Partner: Not on file  . Emotionally Abused: Not on file  . Physically Abused: Not on file  . Sexually Abused: Not on file      PHYSICAL EXAM  Vitals:   12/21/19 1038  BP: 124/72  Pulse: 61  Weight: 154 lb (69.9 kg)  Height: 5\' 1"  (1.549 m)   Body mass index is 29.1 kg/m.  Generalized: Well developed, in no acute distress  Cardiology: normal rate and rhythm, no murmur noted Respiratory: clear to  auscultation bilaterally  Neurological examination  Mentation: Alert oriented to time, place, history taking. Follows all commands speech and language fluent Cranial nerve II-XII: Pupils were equal round reactive to light. Extraocular movements were full, visual field were full on confrontational test. Facial sensation and strength were normal. Uvula tongue midline. Head turning and shoulder shrug  were normal and symmetric. Motor: The motor testing reveals 5 over 5 strength of all 4 extremities. Good symmetric motor tone is noted throughout.  Sensory: Sensory testing is intact to soft touch on all 4 extremities. No evidence of extinction is noted.  Coordination: Cerebellar testing reveals good finger-nose-finger and heel-to-shin bilaterally.  Gait and station: Gait is slightly wide, stable with Rolator, did not attempt tandem  Reflexes: Deep tendon reflexes are symmetric and normal bilaterally.   DIAGNOSTIC DATA (LABS, IMAGING, TESTING) - I reviewed patient records, labs, notes, testing and imaging myself where available.  No flowsheet data found.   Lab Results  Component Value Date   WBC 3.8 09/30/2016   HGB 12.4 09/30/2016   HCT 37.9 09/30/2016   MCV 87 09/30/2016   PLT 225 09/30/2016      Component Value Date/Time   NA 142 09/30/2016 0859   K 3.7 09/30/2016 0859   CL 99 09/30/2016 0859   CO2 30 (H) 09/30/2016 0859   GLUCOSE 109 (H) 09/30/2016 0859   GLUCOSE 85 05/02/2015 0945   BUN 15 09/30/2016 0859   CREATININE 0.75 09/30/2016 0859   CALCIUM 10.1 09/30/2016 0859   PROT 6.9 09/30/2016 0859   ALBUMIN 4.8 09/30/2016 0859   AST 20 09/30/2016 0859   ALT 31 09/30/2016 0859   ALKPHOS 107 09/30/2016 0859   BILITOT 0.3 09/30/2016 0859   GFRNONAA 86 09/30/2016 0859   GFRAA 99 09/30/2016 0859   No results found for: CHOL, HDL, LDLCALC, LDLDIRECT, TRIG, CHOLHDL Lab Results  Component Value Date   HGBA1C 5.7 (H) 05/02/2015   No results found for: VITAMINB12 No results found  for: TSH     ASSESSMENT AND PLAN 64 y.o. year old female  has a past medical history of Anemia, Anxiety, Arthritis, Chronic kidney disease, Complication of anesthesia, Complication of anesthesia, GERD (gastroesophageal reflux disease), H/O hiatal hernia, Headache(784.0), Heart murmur, Hypertension, Lumbar spinal stenosis, Lumbar spondylolysis, MS (multiple sclerosis) ([place]), Neuromuscular disorder ([place]), Urinary frequency, and Wears glasses. here with     ICD-10-CM   1. Multiple sclerosis ([place])  G35   2. Gait disorder  R26.9   3. Weakness  R53.1   4. Other fatigue  R53.83   5. Migraine without aura and without status migrainosus, not intractable  G43.009   6. Urinary urgency  R39.15  Overall, Naiyana continues to do fairly well. She endorses more weakness related to inactivity. PCP has referred to different PT provider as she was discharged from previous group. She was encouraged to follow up with this. We will continue current treatment plan. Healthy lifestyle habits advised. Regular follow up with PCP. She will return to see [place] in 6 months.   No orders of the defined types were placed in this encounter.    No orders of the defined types were placed in this encounter.     I spent 25 minutes with the patient. 50% of this time was spent counseling and educating patient on plan of care and medications.    Debbora Presto, FNP-C 12/21/2019, 12:25 PM [facility] [address] [place], [place] [address] [phone]

## 2019-12-21 NOTE — Patient Instructions (Signed)
We will continue current treatment plan.   Continue close follow up with PCP. Stay well hydrated. Use Rolator or cane as directed.   Follow up in 6 months with Dr Felecia Shelling.   Fall Prevention in Hospitals, Adult Being a patient in the hospital puts you at risk for falling. Falls can cause serious injury and harm, but they can be prevented. It is important to understand what puts you at risk for falling and what you and your health care team can do to prevent you from falling. If you or a loved one falls at the hospital, it is important to tell hospital staff about it. What increases the risk for falls? Certain conditions and treatments may increase your risk of falling in the hospital. These include:  Being in an unfamiliar environment, especially when using the bathroom at night.  Having surgery.  Being on bed rest.  Taking many medicines or certain types of medicines, such as sleeping pills.  Having tubes in place, such as IV lines or catheters. Other risk factors for falls in a hospital include:  Having difficulty with hearing or vision.  Having a change in thinking or behavior, such as confusion.  Having depression.  Having trouble with balance.  Being a female.  Feeling dizzy.  Needing to use the toilet frequently.  Having fallen during the past three months.  Having low blood pressure. What are some strategies for preventing falls? If you or a loved one has to stay in the hospital:  Ask about which fall prevention strategies will be in place. Do not hesitate to speak up if you notice that the fall prevention plan has changed.  Ask for help moving around, especially after surgery or when feeling unwell.  If you have been asked to call for help when getting up, do not get up by yourself. Asking for help with getting up is for your safety, and the staff is there to help you.  Wear nonskid footwear.  Get up slowly, and sit at the side of the bed for a few minutes  before standing up.  Keep items you need, such as the nurse call button or a phone, close to you so that you do not need to reach for them.  Wear eyeglasses or hearing aids if you have them.  Have someone stay in the hospital with you or your loved one.  Ask if sleeping pills or other medicines that can cause confusion are necessary. What does the hospital staff do to help prevent falls? Hospitals have systems in place to prevent falls and accidents, which may involve:  Discussing your fall risks and making a personalized fall prevention plan.  Checking in regularly to see if you need help.  Placing an arm band on your wrist or a sign near your room to alert other staff of your needs.  Using an alarm on your hospital bed. This is an alarm that goes off if you get out of bed and forget to call for help.  Keeping the bed in a low and locked position.  Keeping the area around the bed and bathroom well-lit and free from clutter.  Keeping your room quiet, so that you can sleep and be well-rested.  Using safety equipment, such as: ? A belt around your waist. ? Walkers, crutches, and other devices for support. ? Safety beds, such as low beds or cushions on the floor next to the bed.  Having a staff person stay with you (one-on-one observation), even when  you are using the bathroom. This is for your safety.  Using video monitoring. This allows a staff member to come to help you if you need help. What other actions can I take to lower my risk of falls?  Check in regularly with your health care provider or pharmacist to review all of the medicines that you take.  Make sure that you have a regular exercise program to stay fit. This will help you maintain your balance.  Talk with a physical therapist or trainer if recommended by your health care provider. They can help you to improve your strength, balance, and endurance.  If you are over age 6: ? Ask your health care provider if you  need a calcium or vitamin D supplement. ? Have your eyes and hearing checked every year. ? Have your feet checked every year. Where to find more information You can find more information about fall prevention from the Centers for Disease Control and Prevention: ImproveLook.cz Summary  Being in an unfamiliar environment, such as the hospital, increases your risk for falling.  If you have been asked to call for help when getting up, do not get up by yourself. Asking for help with getting up is for your safety, and the staff is there to help you.  Ask about which fall prevention strategies will be in place. Do not hesitate to speak up if you notice that the fall prevention plan has changed.  If you or a loved one falls, tell the hospital staff. This is important. This information is not intended to replace advice given to you by your health care provider. Make sure you discuss any questions you have with your health care provider. Document Revised: 02/20/2017 Document Reviewed: 10/22/2016 Elsevier Patient Education  2020 [place].   Multiple Sclerosis Multiple sclerosis (MS) is a disease of the brain, spinal cord, and optic nerves (central nervous system). It causes the body's disease-fighting (immune) system to destroy the protective covering (myelin sheath) around nerves in the brain. When this happens, signals (nerve impulses) going to and from the brain and spinal cord do not get sent properly or may not get sent at all. There are several types of MS:  Relapsing-remitting MS. This is the most common type. This causes sudden attacks of symptoms. After an attack, you may recover completely until the next attack, or some symptoms may remain permanently.  Secondary progressive MS. This usually develops after the onset of relapsing-remitting MS. Similar to relapsing-remitting MS, this type also causes sudden attacks of symptoms. Attacks may be less frequent, but symptoms slowly get  worse (progress) over time.  Primary progressive MS. This causes symptoms that steadily progress over time. This type of MS does not cause sudden attacks of symptoms. The age of onset of MS varies, but it often develops between 23-83 years of age. MS is a lifelong (chronic) condition. There is no cure, but treatment can help slow down the progression of the disease. What are the causes? The cause of this condition is not known. What increases the risk? You are more likely to develop this condition if:  You are a woman.  You have a relative with MS. However, the condition is not passed from parent to child (inherited).  You have a lack (deficiency) of vitamin D.  You smoke. MS is more common in the [place] than in the [place]. What are the signs or symptoms? Relapsing-remitting and secondary progressive MS cause symptoms to occur in episodes  or attacks that may last weeks to months. There may be long periods between attacks in which there are almost no symptoms. Primary progressive MS causes symptoms to steadily progress after they develop. Symptoms of MS vary because of the many different ways it affects the central nervous system. The main symptoms include:  Vision problems and eye pain.  Numbness.  Weakness.  Inability to move your arms, hands, feet, or legs (paralysis).  Balance problems.  Shaking that you cannot control (tremors).  Muscle spasms.  Problems with thinking (cognitive changes). MS can also cause symptoms that are associated with the disease, but are not always the direct result of an MS attack. They may include:  Inability to control urination or bowel movements (incontinence).  Headaches.  Fatigue.  Inability to tolerate heat.  Emotional changes.  Depression.  Pain. How is this diagnosed? This condition is diagnosed based on:  Your symptoms.  A neurological exam. This involves checking central nervous system  function, such as nerve function, reflexes, and coordination.  MRIs of the brain and spinal cord.  Lab tests, including a lumbar puncture that tests the fluid that surrounds the brain and spinal cord (cerebrospinal fluid).  Tests to measure the electrical activity of the brain in response to stimulation (evoked potentials). How is this treated? There is no cure for MS, but medicines can help decrease the number and frequency of attacks and help relieve nuisance symptoms. Treatment options may include:  Medicines that reduce the frequency of attacks. These medicines may be given by injection, by mouth (orally), or through an IV.  Medicines that reduce inflammation (steroids). These may provide short-term relief of symptoms.  Medicines to help control pain, depression, fatigue, or incontinence.  Vitamin D, if you have a deficiency.  Using devices to help you move around (assistive devices), such as braces, a cane, or a walker.  Physical therapy to strengthen and stretch your muscles.  Occupational therapy to help you with everyday tasks.  Alternative or complementary treatments such as exercise, massage, or acupuncture. Follow these instructions at home:  Take over-the-counter and prescription medicines only as told by your health care provider.  Do not drive or use heavy machinery while taking prescription pain medicine.  Use assistive devices as recommended by your physical therapist or your health care provider.  Exercise as directed by your health care provider.  Return to your normal activities as told by your health care provider. Ask your health care provider what activities are safe for you.  Reach out for support. Share your feelings with friends, family, or a support group.  Keep all follow-up visits as told by your health care provider and therapists. This is important. Where to find more information  National Multiple Sclerosis Society:  https://www.nationalmssociety.org Contact a health care provider if:  You feel depressed.  You develop new pain or numbness.  You have tremors.  You have problems with sexual function. Get help right away if:  You develop paralysis.  You develop numbness.  You have problems with your bladder or bowel function.  You develop double vision.  You lose vision in one or both eyes.  You develop suicidal thoughts.  You develop severe confusion. If you ever feel like you may hurt yourself or others, or have thoughts about taking your own life, get help right away. You can go to your nearest emergency department or call:  Your local emergency services (911 in the [place]  A suicide crisis helpline, such as the [place] [facility]  [facility] at 213-617-6461. This is open 24 hours a day. Summary  Multiple sclerosis (MS) is a disease of the central nervous system that causes the body's immune system to destroy the protective covering (myelin sheath) around nerves in the brain.  There are 3 types of MS: relapsing-remitting, secondary progressive, and primary progressive. Relapsing-remitting and secondary progressive MS cause symptoms to occur in episodes or attacks that may last weeks to months. Primary progressive MS causes symptoms to steadily progress after they develop.  There is no cure for MS, but medicines can help decrease the number and frequency of attacks and help relieve nuisance symptoms. Treatment may also include physical or occupational therapy.  If you develop numbness, paralysis, vision problems, or other neurological symptoms, get help right away. This information is not intended to replace advice given to you by your health care provider. Make sure you discuss any questions you have with your health care provider. Document Revised: 02/20/2017 Document Reviewed: 05/19/2016 Elsevier Patient Education  2020 [employer].

## 2019-12-21 NOTE — Progress Notes (Signed)
I have read the note, and I agree with the clinical assessment and plan.  Amedio Bowlby A. Helane Briceno, MD, PhD, FAAN Certified in Neurology, Clinical Neurophysiology, Sleep Medicine, Pain Medicine and Neuroimaging  [facility] [address], [address] [place], [place] [address] [phone]  

## 2019-12-21 NOTE — Progress Notes (Signed)
[name] is a 64 y.o. female here for a f/u on MS. Pt said she is weaker than she was her last visit. Pt says she has felled a few times also complains of headaches.

## 2019-12-23 ENCOUNTER — Telehealth: Payer: Self-pay | Admitting: Family Medicine

## 2019-12-23 NOTE — Telephone Encounter (Signed)
Pt called, I need physical therapy, could you recommend where I could get physical therapy? Would like a call from the nurse.

## 2019-12-26 NOTE — Telephone Encounter (Signed)
I called pt and she would like to go to PT ([facility] pt cancelled 6 times, [facility] does not do for neuo problems).  She has to use [facility] for transport.  Please reorder since last seen 12-21-19. Thanks

## 2019-12-26 NOTE — Telephone Encounter (Signed)
I saw her in follow up last week. She reported that she had been referred to PT by PCP and they were getting this set up. Can you confirm that they have done this for her? We do not want to duplicate.

## 2019-12-27 NOTE — Telephone Encounter (Signed)
I have tried to call p[facility], to see if referral done by pcp.   Last time was done for [facility], pt stated that they did not do neuro problems.

## 2019-12-27 NOTE — Telephone Encounter (Signed)
I called [facility] spoke to referrals (TAMMIE) and she will call [facility] and ask about referral for PT with LE weakness.  This referral was done 12-15-19 by Dr. Tamala Julian.  I relayed I will call pt and let her know that pcp to take care of this for her.  I spoke to pt and let her know that Dr. Thompson Caul office will f/u on this for her and hopefully she will get her some information soon.  She appreciated call back.

## 2019-12-29 ENCOUNTER — Telehealth: Payer: Self-pay | Admitting: Family Medicine

## 2019-12-29 MED ORDER — GABAPENTIN 300 MG PO CAPS: ORAL_CAPSULE | ORAL | 5 refills | Status: DC

## 2019-12-29 NOTE — Telephone Encounter (Signed)
Pt called stating that her gabapentin (NEURONTIN) 300 MG capsule is being sent to the wrong pharmacy. Pt states that she does not use [facility] and is needing all of her medications sent to the [facility].

## 2019-12-29 NOTE — Telephone Encounter (Signed)
Sent prescription for gabapentin to [facility]

## 2020-01-09 ENCOUNTER — Telehealth: Payer: Self-pay | Admitting: Family Medicine

## 2020-01-09 NOTE — Telephone Encounter (Signed)
Pt called and LVM asking to speak to the RN that she had spoken to earlier in the day. Please advise.

## 2020-01-09 NOTE — Telephone Encounter (Signed)
I called pt.  She states she is having 2-3 days of LE weakness, wobbly legs, having to use walker , has had several falls.  More urine and bowel uregency. Has not had physical therapy (has spoken to [facility]- tammy concerning this.  She did not want to see Amy/NP  Wanted to see Dr. Felecia Shelling.  She feels like she is having an MS exacerbation.  Please advise.

## 2020-01-09 NOTE — Telephone Encounter (Signed)
Called pt back. Placed her on hold and spoke with Dr. Felecia Shelling about where we could work her in for an appt. PEr Dr. Felecia Shelling, ok to offer 01/11/20 at 2pm, check in 1:30pm. I offered this to the pt, she accepted. Her sister, Manuela Schwartz will be coming to the appt. Advised they both must wear a mask to the appt. If any exposure to covid-19 for either prior to appt, they should call to let us know. If they have any signs/sx prior to appt, they should also let us know. She verbalized understanding.

## 2020-01-09 NOTE — Telephone Encounter (Signed)
Called pt and offered appt this afternoon (Dr. Felecia Shelling does not have any other openings this week at the moment). Pt declined, does not have a ride. Needs at least 1 day to arrange transportation. Advised I will watch for any cx and call her if this happens. I will make Dr. Felecia Shelling aware. She verbalized understanding.

## 2020-01-09 NOTE — Telephone Encounter (Signed)
Thank you for the update!

## 2020-01-09 NOTE — Telephone Encounter (Signed)
Pt states that for the last week she has been feeling wobbly on her legs and her hands feel like they are shaking due to her MS and she is wanting to discuss with RN. Please advise.

## 2020-01-09 NOTE — Telephone Encounter (Signed)
We can try to get her in this week (I could do this afternoon if no slots Tuesday or Wednesday

## 2020-01-11 ENCOUNTER — Ambulatory Visit (INDEPENDENT_AMBULATORY_CARE_PROVIDER_SITE_OTHER): Payer: Medicare Other | Admitting: Neurology

## 2020-01-11 ENCOUNTER — Encounter: Payer: Self-pay | Admitting: Neurology

## 2020-01-11 VITALS — BP 119/70 | HR 78 | Ht 61.0 in | Wt 150.5 lb

## 2020-01-11 DIAGNOSIS — G35 Multiple sclerosis: Secondary | ICD-10-CM | POA: Diagnosis not present

## 2020-01-11 DIAGNOSIS — G35D Multiple sclerosis, unspecified: Secondary | ICD-10-CM

## 2020-01-11 DIAGNOSIS — R531 Weakness: Secondary | ICD-10-CM | POA: Diagnosis not present

## 2020-01-11 DIAGNOSIS — F418 Other specified anxiety disorders: Secondary | ICD-10-CM | POA: Diagnosis not present

## 2020-01-11 DIAGNOSIS — R3915 Urgency of urination: Secondary | ICD-10-CM

## 2020-01-11 DIAGNOSIS — R269 Unspecified abnormalities of gait and mobility: Secondary | ICD-10-CM | POA: Diagnosis not present

## 2020-01-11 MED ORDER — TRAMADOL HCL 50 MG PO TABS
50.0000 mg | ORAL_TABLET | Freq: Every day | ORAL | 0 refills | Status: AC
Start: 2020-01-11 — End: ?

## 2020-01-11 MED ORDER — BACLOFEN 10 MG PO TABS: ORAL_TABLET | ORAL | 11 refills | Status: DC

## 2020-01-11 NOTE — Patient Instructions (Signed)
TAKE: 100 mg of gabapentin at night only.   If insomnia worsens this can be increased to 200 mg  10 mg of baclofen around noon and 20 mg of baclofen at night.

## 2020-01-11 NOTE — Telephone Encounter (Addendum)
Called [name] back at Dr. Thompson Caul (PCP) office. Pt now part of chronic care management. She has started with their clinical pharmacist to help manage medications. Pt has declined packaging for meds, even though they have recommend she do this to avoid confusion.   [name] called to report pt is wanting gabapentin dose changed from 300mg  to 100mg , directions: take 1 at lunch and 2 at bedtime. PCP was also refilling gabapentin and baclofen for pt upon further review with [name]. Advised Dr. Felecia Shelling will refill these moving forward. She will let Dr. Tamala Julian know.  Pt received gabapentin 300mg  01/01/20 #270, 2 refills, directions: take 1 po qhs (sent to [facility]) rx'd by PCP. PCP also rx'd gabapentin 100mg , directions: take 1 cap po BID on 08/10/19 but this rx had no refills. Our office recently sent a refill for gabapentin 300mg  12/29/19, directions: Take one capsule at dinner and one at bedtime (sent to [facility]).   Dr. Thompson Caul office also sent refill for baclofen 10mg  #270, 0 refills, directions: 1 tab po during the day, 2 tabs po qhs (sent to [facility]). Our office refilled baclofen 20mg , directions:Take 2 tablets by mouth at bedtime (sent to [facility])  I advised that pt has a follow up with Dr. Felecia Shelling today at 2pm. Advised once seen, I will fax them an update about medications. Their fax: [phone] MEDICATIONS SENT TODAY SHOULD BE SENT TO [facility] PER LAURA. SHE HAS SPOKEN WITH PT ABOUT THIS

## 2020-01-11 NOTE — Telephone Encounter (Signed)
[name] @ [facility] (with Dr Jossie Ng office) is asking for a call back from [facility] re: pt's gabapentin (NEURONTIN) 300 MG capsule

## 2020-01-11 NOTE — Progress Notes (Signed)
[facility]  PATIENT: [name] DOB: 02/06/1956  REFERRING DOCTOR OR PCP:  Dr. Leta Baptist  SOURCE: patient, MRI images on PACS, imaging reports, notes in EPIC and labs in EPIC  _________________________________   HISTORICAL  CHIEF COMPLAINT:  Chief Complaint  Patient presents with   Follow-up    RM 13 with sister, Manuela Schwartz. Last seen 12/21/19 by AL,NP. Ambulates with rolling walker.    Multiple Sclerosis    Off DMT. For the last week, she has been feeling weak on legs, hands are shaky, having more falls/using walker.     HISTORY OF PRESENT ILLNESS:  Jessia Hall is a 64 y.o. woman with relapsing remitting multiple sclerosis diagnosed in 1983  Update 10/202/21: After the last visit, Betaseron was discontinued.  I had a long discussion with her and her sister that MS tends to be the most aggressive during the first 5 to 10 years.  She has now had MS for about 40 years and has had no real change on MRI over the last decade.  Therefore, there is a high likelihood that she will be able to remain off of a disease modifying therapy.    She is feeling weaker and has needed to rely on her walker.   Balance is also off.  She has had a fall when legs just gave out.     She had an MRI early 2021 that shows extensive T2/FLAIR hyperintense foci in the hemispheres and a few foci in the pons and cerebellum in a pattern and configuration consistent with chronic demyelinating plaque associated with multiple sclerosis.  There could also be superimposed foci of chronic microvascular ischemic change.  She has some spasticity and takes baclofen.   She has urinary frequency/urgency.   Solifenacin does not seem to help the bladder much and makes her mouth dry.  There has been some confusion in medications and she has been getting gabapentin baclofen from our office at her primary care practice.   She is taking gabapentin 100 mg po qpm and 300 mg po qHS.  She feels the night time dose  carries over into the mornings.    She is als also on baclofen and is not sure what dose she is actually taking right now.  She has some anxiety but feels it is doing better on the combination of sertraline and buspar and is much better than on sertraline alone.  Due to insomnia she also takes trazodone at bedtime..     She has reduced short term memory and reduced attention.  Her sister is with her today and we are discussed some of the cognitive issues, medication issues and options if she is no longer able to live on her own.  She notes some neck pain and tightness.  She takes tramadol once a day with benefit.   MS History:    In 1983, while at work, she was having difficulty with her gait and was unsteady and falling. She R doctor and was referred to Dr. Morrell Riddle of [facility] neurology who diagnosed her with multiple sclerosis. She was in the Betaseron drug study (was on placebo) with Dr. Terie Purser in [place]. When Betaseron became available in 1992 or 1993 she started the medication and was seen Dr. Erling Cruz. Over the next few years she had a couple exacerbations that involved leg function, gait or balance. She had some treatments with IV steroids. She also has had some slow worsening with her gait more recently.   MRI Review:  MRI of the brain and cervical spine from 07/25/2013. In the brain, there are extensive T2/FLAIR hyperintense foci involving the cerebellum, left posterior medulla, there was a normal enhancement pattern. The cervical spine showed hyperintense foci adjacent to C3, C5-C6, C6, and T1. Right posterior pons, bilateral hemispheres, predominantly in the periventricular cortical white matter.   None of these enhanced.  MRI of the brain March 2020 showed Extensive T2/FLAIR hyperintense foci in the hemispheres and a few foci in the pons and cerebellum in a pattern and configuration consistent with chronic demyelinating plaque associated with multiple sclerosis.  There could also be  superimposed foci of chronic microvascular ischemic change.  The MRI is essentially unchanged compared to 2011    REVIEW OF SYSTEMS: Constitutional: No fevers, chills, sweats, or change in appetite.  Fatigue and sleepiness.  She has maintenance insomnia Eyes: No visual changes, double vision, eye pain.   Some fatigue. She has insomnia helped by trazodone. Ear, nose and throat: No hearing loss, ear pain, nasal congestion, sore throat Cardiovascular: No chest pain, palpitations Respiratory: No shortness of breath at rest or with exertion.   No wheezes.   She snores GastrointestinaI: No nausea, vomiting, diarrhea, abdominal pain, fecal incontinence Genitourinary: as above Musculoskeletal: No neck pain, back pain Integumentary: No rash, pruritus, skin lesions Neurological: as above Psychiatric: No depression at this time.  No anxiety Endocrine: No palpitations, diaphoresis, change in appetite, change in weigh or increased thirst Hematologic/Lymphatic: No anemia, purpura, petechiae. Allergic/Immunologic: No itchy/runny eyes, nasal congestion, recent allergic reactions, rashes  ALLERGIES: Allergies  Allergen Reactions   Other Other (See Comments)    Pt states she took a medication 2-3 yrs ago that may have been for inflammation and it caused her to be weak.   Myrbetriq [Mirabegron]     Caused her to dry out/nose bleeds    HOME MEDICATIONS:  Current Outpatient Medications:    acetaminophen (TYLENOL) 500 MG tablet, Take 500 mg by mouth every 6 (six) hours as needed., Disp: , Rfl:    amLODipine (NORVASC) 10 MG tablet, Take 10 mg by mouth daily.  , Disp: , Rfl:    atorvastatin (LIPITOR) 20 MG tablet, Take 20 mg by mouth daily.  , Disp: , Rfl:    baclofen (LIORESAL) 20 MG tablet, Take 2 tablets by mouth at bedtime, Disp: 60 tablet, Rfl: 5   busPIRone (BUSPAR) 15 MG tablet, TAKE 1/2 TABLET BY MOUTH 2 TIMES DAILY., Disp: 90 tablet, Rfl: 1   fluticasone (FLONASE) 50 MCG/ACT nasal  spray, Place 2 sprays into the nose daily. For stuffy nose. , Disp: , Rfl:    gabapentin (NEURONTIN) 300 MG capsule, Take one capsule at dinner and one at bedtime. (FOR NERVE PAIN/NEUROPATHY), Disp: 60 capsule, Rfl: 5   ibuprofen (ADVIL,MOTRIN) 600 MG tablet, Take 1 tablet by mouth every 8 (eight) hours as needed for mild pain or moderate pain. , Disp: , Rfl:    losartan-hydrochlorothiazide (HYZAAR) 100-25 MG tablet, Take 1 tablet by mouth daily., Disp: , Rfl:    montelukast (SINGULAIR) 10 MG tablet, Take 10 mg by mouth at bedtime.  , Disp: , Rfl:    NEXIUM 40 MG capsule, Take 40 mg by mouth daily., Disp: , Rfl:    nystatin (NYAMYC) powder, Apply topically 4 (four) times daily., Disp: , Rfl:    ondansetron (ZOFRAN) 4 MG tablet, Take 4 mg by mouth every 8 (eight) hours as needed for nausea or vomiting., Disp: , Rfl:    potassium chloride (K-DUR) 10  MEQ tablet, Take 10 mEq by mouth daily., Disp: , Rfl:    sertraline (ZOLOFT) 100 MG tablet, Take 1 tablet (100 mg total) by mouth daily., Disp: 90 tablet, Rfl: 1   solifenacin (VESICARE) 10 MG tablet, Take 1 tablet (10 mg total) by mouth daily., Disp: 30 tablet, Rfl: 5   traMADol (ULTRAM) 50 MG tablet, Take 50 mg by mouth every 8 (eight) hours as needed for severe pain. , Disp: , Rfl:    traZODone (DESYREL) 100 MG tablet, Take 100 mg by mouth at bedtime.  , Disp: , Rfl:   PAST MEDICAL HISTORY: Past Medical History:  Diagnosis Date   Anemia    HX   Anxiety    Arthritis    Chronic kidney disease    UTI'S   Complication of anesthesia    BP RISES WHEN AWAKENING   Complication of anesthesia    "difficult waking up and can become combative"    GERD (gastroesophageal reflux disease)    H/O hiatal hernia    Headache(784.0)    Heart murmur    Hypertension    Lumbar spinal stenosis    Lumbar spondylolysis    MS (multiple sclerosis) (HCC)    Neuromuscular disorder (HCC)    MS   Urinary frequency    Wears glasses      PAST SURGICAL HISTORY: Past Surgical History:  Procedure Laterality Date   CHOLECYSTECTOMY     EYE SURGERY     BLEPHAROPLASTY   LUMBAR FUSION  05/10/2015   POSTERIOR FUSION LUMBAR SPINE  04/16/11   L4-5   TRANSFORAMINAL LUMBAR INTERBODY FUSION (TLIF) WITH PEDICLE SCREW FIXATION 1 LEVEL N/A 05/10/2015   Procedure: TRANSFORAMINAL LUMBAR INTERBODY FUSION (TLIF) WITH PEDICLE SCREW FIXATION L3-4;  Surgeon: Eustace Moore, MD;  Location: [facility];  Service: Neurosurgery;  Laterality: N/A;  TRANSFORAMINAL LUMBAR INTERBODY FUSION (TLIF) WITH PEDICLE SCREW FIXATION L3-4   UTERINE FIBROID EMBOLIZATION      FAMILY HISTORY: Family History  Problem Relation Age of Onset   Lung cancer Mother    Emphysema Father    Heart disease Other     SOCIAL HISTORY:  Social History   Socioeconomic History   Marital status: Single    Spouse name: Not on file   Number of children: 0   Years of education: 14   Highest education level: Not on file  Occupational History    Comment: Does not work - Disabled  Tobacco Use   Smoking status: Former Smoker    Packs/day: 2.00    Quit date: 04/08/1996    Years since quitting: 23.7   Smokeless tobacco: Never Used   Tobacco comment: ALCOHOL IN PAST  Substance and Sexual Activity   Alcohol use: No   Drug use: No   Sexual activity: Yes    Birth control/protection: Post-menopausal  Other Topics Concern   Not on file  Social History Narrative   Patient does not work she is disabled. and lives at home. Patient is single.   Caffeine: 1-2 cups daily depending on the day   Right handed.   Social Determinants of Health   Financial Resource Strain:    Difficulty of Paying Living Expenses: Not on file  Food Insecurity:    Worried About Charity fundraiser in the Last Year: Not on file   [employer] of Food in the Last Year: Not on file  Transportation Needs:    Lack of Transportation (Medical): Not on file   Lack of  Transportation  (Non-Medical): Not on file  Physical Activity:    Days of Exercise per Week: Not on file   Minutes of Exercise per Session: Not on file  Stress:    Feeling of Stress : Not on file  Social Connections:    Frequency of Communication with Friends and Family: Not on file   Frequency of Social Gatherings with Friends and Family: Not on file   Attends Religious Services: Not on file   Active Member of Clubs or Organizations: Not on file   Attends Archivist Meetings: Not on file   Marital Status: Not on file  Intimate Partner Violence:    Fear of Current or Ex-Partner: Not on file   Emotionally Abused: Not on file   Physically Abused: Not on file   Sexually Abused: Not on file     PHYSICAL EXAM  Vitals:   01/11/20 1336  BP: 119/70  Pulse: 78  SpO2: 97%  Weight: 150 lb 8 oz (68.3 kg)  Height: 5\' 1"  (1.549 m)    Body mass index is 28.44 kg/m.    General: The patient is well-developed and well-nourished and in no acute distress  Skin: Extremities are without rash or edema.  Neurologic Exam  Mental status: The patient is alert and oriented x 3 at the time of the examination. The patient has apparent normal recent and remote memory, with an apparently normal attention span and concentration ability.   Speech is normal.  Cranial nerves: Extraocular movements are full.  Facial strength and sensation was normal.  Trapezius strength is normal.  No obvious hearing deficits are noted.  Motor:  Muscle bulk is normal.  Muscle tone is increased in the legs.  Strength was 5/5  Sensory: Normal touch and vibration sensation in limbs.    Coordination: She has good finger-nose-finger bilaterally.  Heel-to-shin is mildly reduced  Gait and station: Station is normal.  Gait is mildly wide.  She can walk without support.  She cannot do a tandem walk.   Romberg is borderline.   Reflexes: Deep tendon reflexes are symmetric and normal bilaterally.       ASSESSMENT  AND PLAN  Multiple sclerosis (HCC)  Gait disorder  Weakness  Depression with anxiety  Urinary urgency  1.   Gabapentin 100 mg po qHS for now but if needs higher dose for insomnia will increase to 200 mg or 300 mg.  Change baclofen to just 10 mg po qnoon and 20 mg po qHS 2.   Continue sertraline and BuSpar for mood.  For now, she will continue trazodone at bedtime.  If she is sleeping better we could try to reduce the trazodone. 3.   Stay active   4,   Stop Vesicare. 5.    She will return in 6 months or sooner if there are new or worsening neurologic symptoms.  45-minute office visit with the majority of the time spent face-to-face for history and physical, discussion/counseling and decision-making.  Additional time with record review and documentation.   Eric Morganti A. Felecia Shelling, MD, PhD 36/14/4315, 4:00 PM Certified in Neurology, Clinical Neurophysiology, Sleep Medicine, Pain Medicine and Neuroimaging  [facility] [address] [place], [place] [address] [phone]

## 2020-01-12 ENCOUNTER — Telehealth: Payer: Self-pay | Admitting: Neurology

## 2020-01-12 NOTE — Telephone Encounter (Signed)
Called and spoke with pt. Relayed Dr. Sater's message. She verbalized understanding and appreciation. 

## 2020-01-12 NOTE — Telephone Encounter (Signed)
Pt states as a result of the changes Dr Felecia Shelling made to her medications her heartbeat is different.  Pt was asked if its more intense, or slower.  Pt said it is just different to her and she would like a call to discuss.

## 2020-01-12 NOTE — Telephone Encounter (Signed)
Per Dr. Felecia Shelling, the medication changes he made should not affect her heart rate. If she continues to have this issue, she should follow up with PCP.

## 2020-01-24 ENCOUNTER — Telehealth: Payer: Self-pay | Admitting: Neurology

## 2020-01-24 NOTE — Telephone Encounter (Signed)
No major recommendation.  If she does take anti-inflammatories like ibuprofen (on her list as as needed) she should make sure to take with food

## 2020-01-24 NOTE — Telephone Encounter (Signed)
Ok to get back on vesicare - we can send in script when she needs one

## 2020-01-24 NOTE — Telephone Encounter (Signed)
Called pt. Relayed Dr. Garth Bigness message. She verbalized understanding.   She forgot to mention that she has been nauseated for the last month or so constantly. PCP recommended she try to avoid an empty stomach. Has not helped. She denies starting any new meds in this timeframe. Nothing else has changed. Wanting to know if Dr. Felecia Shelling has any recommendations.

## 2020-01-24 NOTE — Telephone Encounter (Signed)
Called and spoke with pt. Relayed Dr. Garth Bigness message. Recommended she f/u with PCP for ongoing sx. She verbalized understanding.

## 2020-01-24 NOTE — Telephone Encounter (Signed)
Pt called wanting to discuss the possibility of getting back on the Solifenacin. Please advise.

## 2020-01-24 NOTE — Telephone Encounter (Signed)
Called and spoke with pt. She was up 5 times last night having to urinate. She ended up taking a vesicare and feels it helped. Would like to go back on this. She has some left at home, would not need rx called in right now. Advised I will speak with MD and call her back.

## 2020-01-26 ENCOUNTER — Other Ambulatory Visit: Payer: Self-pay | Admitting: *Deleted

## 2020-01-26 MED ORDER — GABAPENTIN 100 MG PO CAPS: ORAL_CAPSULE | ORAL | 11 refills | Status: DC

## 2020-01-26 MED ORDER — SERTRALINE HCL 100 MG PO TABS
100.0000 mg | ORAL_TABLET | Freq: Every day | ORAL | 3 refills | Status: DC
Start: 2020-01-26 — End: 2020-06-12

## 2020-01-26 MED ORDER — BUSPIRONE HCL 15 MG PO TABS: ORAL_TABLET | ORAL | 3 refills | Status: DC

## 2020-01-26 MED ORDER — BACLOFEN 10 MG PO TABS: ORAL_TABLET | ORAL | 11 refills | Status: DC

## 2020-01-26 NOTE — Progress Notes (Addendum)
Spoke with Dr. Felecia Shelling. He agrees to take over prescribing buspar and sertraline. E-scribed rx's to [facility]. Cx rx's on file at [facility] for these.  Directions for gabapentin should be 100mg , 1-2 caps po qhs #60. E-scribed updated rx to [facility].  Also cx rx's on file at [facility] for gabapentin and baclofen.   Called [name] back at PCP office and provided update. I called pt and provided update as well. Reminded her to take gabapentin 100mg  cap (1-2 po qhs) only. She verbalized understanding.

## 2020-01-26 NOTE — Progress Notes (Signed)
Took call from phone staff and spoke with [name] at her PCP office. Needing to know if Dr. Felecia Shelling plans on prescribing buspar and sertraline and needing new rx's sent to [facility] if so. Also needing baclofen rx. Verified dosing. When I checked further, rx did not e-scribe correctly, I re-sent this. Also needed to verify how he wanted her to take gabapentin. Directions pt has does not match what Dr. Felecia Shelling wrote in [place] note from 01/11/20. Advised I will discuss with Dr. Felecia Shelling, send updated [facility] and then call them back with update.  PCP phone number: [phone].

## 2020-01-30 ENCOUNTER — Telehealth: Payer: Self-pay | Admitting: Neurology

## 2020-01-30 DIAGNOSIS — G35D Multiple sclerosis, unspecified: Secondary | ICD-10-CM

## 2020-01-30 DIAGNOSIS — R350 Frequency of micturition: Secondary | ICD-10-CM

## 2020-01-30 DIAGNOSIS — G35 Multiple sclerosis: Secondary | ICD-10-CM

## 2020-01-30 NOTE — Telephone Encounter (Signed)
Pt called, having frequent urination, diarrhea. Would like for my sister to have permission to discuss with physician and nurse. Would like a call from the nurse.

## 2020-01-30 NOTE — Telephone Encounter (Addendum)
Called and spoke with pt. States imodium ineffective for diarrhea. She has colonoscopy scheduled for beginning of December. Advised it would be best for her to f/u with PCP about ongoing diarrhea/see if they want to refer her to GI specialist. She is also having ongoing urinary issues. States vesicare not helping much. She is agreeable to be referred to urologist for further evaluation/treatment. She prefers to see female provider. Advised I will include this in referral. Also confirmed that sister, Benjamine Mola is on her current DPR form.

## 2020-02-01 ENCOUNTER — Other Ambulatory Visit: Payer: Self-pay | Admitting: *Deleted

## 2020-02-01 ENCOUNTER — Telehealth: Payer: Self-pay | Admitting: Neurology

## 2020-02-01 MED ORDER — BUSPIRONE HCL 15 MG PO TABS: ORAL_TABLET | ORAL | 3 refills | Status: DC

## 2020-02-01 NOTE — Telephone Encounter (Signed)
Called and LVM. Instructed pt to increase dose of buspar take 1/2 tab po q am and 1 tab po for pm dose. Advised she should try this for at least a couple weeks. If she sees no improvement, she should call us back.

## 2020-02-01 NOTE — Telephone Encounter (Signed)
Pt called, having a lot of anxiety and has been going on for a while.  I'm in the middle of trying to fine a therapist. Would like for [name] to call me.

## 2020-02-01 NOTE — Telephone Encounter (Signed)
Pt. Is wanting to make sure pharmacy is aware of this medication so she will not run out. Please advise.  Pharmacy: [facility]

## 2020-02-01 NOTE — Telephone Encounter (Signed)
Called pt back. She is currently trying to get set up with a therapist. She is waiting to get scheduled. She states anxiety is worse. Verified she is taking buspar 15mg , directions: 1/2 tab po BID and sertraline 100mg  po qd. She is wondering if buspar dose can be increased? Denies any new allergies or starting any new meds. She is leaving shortly and will be home about 2/2:30pm but ok to LVM. Advised Dr. Felecia Shelling out this week but will send to [facility]. I will call back once I receive message back from [facility].

## 2020-02-01 NOTE — Telephone Encounter (Signed)
Called pt. Advised new prescription called into [facility] for her. She verbalized understanding.

## 2020-02-01 NOTE — Telephone Encounter (Signed)
Hi Emma- Yes, Buspar is indeed at a lowish dose and she can increase her night time Buspar to one tab of 15 mg, but leaves the AM dose for now as is.  CD  FYI to Dr Felecia Shelling.

## 2020-02-07 ENCOUNTER — Telehealth: Payer: Self-pay | Admitting: Neurology

## 2020-02-07 NOTE — Telephone Encounter (Signed)
Pt. states since taking busPIRone (BUSPAR) 15 MG tablet she is still having difficulties with depression & anxiety. She is not better & has knots in stomach. She is wondering what can be done differently. Okay to leave vm. Please advise.

## 2020-02-07 NOTE — Telephone Encounter (Signed)
Called pt back. She just increased buspar 15mg  to 1/2 tab in the am and 1tab in the pm on 02/01/20. She should be on this at least 2-3 weeks prior to deciding if it is helping. She will give it some more time on this increased dose and will call if it is still ineffective after 2-3 weeks of being on increased dose.

## 2020-02-15 ENCOUNTER — Telehealth: Payer: Self-pay | Admitting: Neurology

## 2020-02-15 MED ORDER — SOLIFENACIN SUCCINATE 10 MG PO TABS
10.0000 mg | ORAL_TABLET | Freq: Every day | ORAL | 0 refills | Status: DC
Start: 2020-02-15 — End: 2021-01-14

## 2020-02-15 NOTE — Telephone Encounter (Signed)
Called and spoke with [name]. Advised rx [facility] sent to [facility] (90days). Referral resent to [facility] d/t them not getting original referral sent on 01/30/20.

## 2020-02-15 NOTE — Telephone Encounter (Addendum)
Called [name] back. Advised we referred pt to [facility] 01/30/20 d/t her reporting Vesicare was not helping. [name] does not think she has been seen there yet. Advised I will reach out to [facility] to confirm this and then call her back with plan. [name] wondering if Dr. Felecia Shelling will call in temporary supply until pt can get in to see urology, she does not think pt has been yet.  Pt previously on Vesicare 10mg  po qd. I called [facility] at [phone] and spoke with Maudie Mercury. She states they did not receive paper referral sent on 01/30/20. Asked it be re-sent to them at [phone]. I re-faxed to her attn and received confirmation.

## 2020-02-15 NOTE — Telephone Encounter (Signed)
Spoke with Dr. Felecia Shelling. He is ok to refill Vesicare 10mg  po qd (90days supply) until she can get in to see Urology. I [facility].

## 2020-02-15 NOTE — Telephone Encounter (Signed)
[name] from [facility] called stating that they are needing the pt's Solifnacin 10mg  transferred over to [facility]. Please advise.

## 2020-03-27 DIAGNOSIS — R350 Frequency of micturition: Secondary | ICD-10-CM | POA: Diagnosis not present

## 2020-03-27 DIAGNOSIS — R3915 Urgency of urination: Secondary | ICD-10-CM | POA: Diagnosis not present

## 2020-04-02 DIAGNOSIS — Z961 Presence of intraocular lens: Secondary | ICD-10-CM | POA: Diagnosis not present

## 2020-04-02 DIAGNOSIS — G35 Multiple sclerosis: Secondary | ICD-10-CM | POA: Diagnosis not present

## 2020-04-02 DIAGNOSIS — H16223 Keratoconjunctivitis sicca, not specified as Sjogren's, bilateral: Secondary | ICD-10-CM | POA: Diagnosis not present

## 2020-04-02 DIAGNOSIS — H47293 Other optic atrophy, bilateral: Secondary | ICD-10-CM | POA: Diagnosis not present

## 2020-04-05 DIAGNOSIS — Z01812 Encounter for preprocedural laboratory examination: Secondary | ICD-10-CM | POA: Diagnosis not present

## 2020-04-09 ENCOUNTER — Ambulatory Visit: Payer: Medicare Other | Admitting: Podiatry

## 2020-04-12 ENCOUNTER — Ambulatory Visit: Payer: Medicare Other | Admitting: Podiatry

## 2020-04-18 ENCOUNTER — Encounter: Payer: Self-pay | Admitting: *Deleted

## 2020-04-18 ENCOUNTER — Telehealth: Payer: Self-pay | Admitting: Neurology

## 2020-04-18 DIAGNOSIS — G4700 Insomnia, unspecified: Secondary | ICD-10-CM | POA: Diagnosis not present

## 2020-04-18 DIAGNOSIS — E7800 Pure hypercholesterolemia, unspecified: Secondary | ICD-10-CM | POA: Diagnosis not present

## 2020-04-18 DIAGNOSIS — K219 Gastro-esophageal reflux disease without esophagitis: Secondary | ICD-10-CM | POA: Diagnosis not present

## 2020-04-18 DIAGNOSIS — I10 Essential (primary) hypertension: Secondary | ICD-10-CM | POA: Diagnosis not present

## 2020-04-18 NOTE — Telephone Encounter (Signed)
Called pt. Advised we will write letter. She would like it mailed. Advised we will place in mail for her likely tomorrow. She verbalized understanding and appreciation.

## 2020-04-18 NOTE — Telephone Encounter (Signed)
Pt is requesting a letter to be excused from [employer], please call.

## 2020-04-25 NOTE — Telephone Encounter (Signed)
Pt called, I have not received the letter to excuse me from jury duty. Would like a call from the nurse.

## 2020-04-25 NOTE — Telephone Encounter (Signed)
Called pt back. Advised it does take a few extra days for mail to go out from our office. If she does not see it in the next few days, I asked her to call me back. She verbalized understanding.

## 2020-04-26 NOTE — Telephone Encounter (Signed)
FYI pt wants Emma,RN to know that she got the letter.

## 2020-04-26 NOTE — Telephone Encounter (Signed)
Noted, thank you

## 2020-05-10 DIAGNOSIS — I10 Essential (primary) hypertension: Secondary | ICD-10-CM | POA: Diagnosis not present

## 2020-05-10 DIAGNOSIS — G4700 Insomnia, unspecified: Secondary | ICD-10-CM | POA: Diagnosis not present

## 2020-05-10 DIAGNOSIS — K219 Gastro-esophageal reflux disease without esophagitis: Secondary | ICD-10-CM | POA: Diagnosis not present

## 2020-05-10 DIAGNOSIS — E7800 Pure hypercholesterolemia, unspecified: Secondary | ICD-10-CM | POA: Diagnosis not present

## 2020-05-16 ENCOUNTER — Telehealth: Payer: Self-pay | Admitting: Neurology

## 2020-05-16 NOTE — Telephone Encounter (Signed)
Called pt back. She is concerned about her losing a lot of weight recently. At her last visit here, she weighed 150lb (01/11/20). Now weighing 138lb.  Eating and trying to get her weight back up but very concerned. She is seeing PCP soon on 05/23/20. Denies any other sx. She will keep our office updated after she sees PCP. She wanted Dr. Felecia Shelling to be aware. Aware he's out this week, back next week.

## 2020-05-16 NOTE — Telephone Encounter (Signed)
Pt. has concerns about her weight & condition. She would like a call from RN after 2pm this afternoon.

## 2020-05-17 NOTE — Telephone Encounter (Signed)
None of her med's shoukd be doing this so best to discuss with PCp

## 2020-05-22 ENCOUNTER — Ambulatory Visit: Payer: Medicare Other | Admitting: Podiatry

## 2020-05-23 DIAGNOSIS — R195 Other fecal abnormalities: Secondary | ICD-10-CM | POA: Diagnosis not present

## 2020-05-23 DIAGNOSIS — R634 Abnormal weight loss: Secondary | ICD-10-CM | POA: Diagnosis not present

## 2020-05-23 DIAGNOSIS — G35 Multiple sclerosis: Secondary | ICD-10-CM | POA: Diagnosis not present

## 2020-05-24 ENCOUNTER — Telehealth: Payer: Self-pay | Admitting: Neurology

## 2020-05-24 NOTE — Telephone Encounter (Signed)
Pt called wanting to inform the RN that yesterday she went to see her PCP Dr. Carol Ada MD and it was a very positive visit but she forgot to ask for the OV report to be sent over. Pt would like to know if the RN can request it to be faxed over. Please advise.

## 2020-05-24 NOTE — Telephone Encounter (Signed)
Request made

## 2020-05-24 NOTE — Telephone Encounter (Signed)
Debra, can you help with this? Thank you 

## 2020-05-29 ENCOUNTER — Telehealth: Payer: Self-pay | Admitting: *Deleted

## 2020-05-29 ENCOUNTER — Telehealth: Payer: Self-pay | Admitting: Neurology

## 2020-05-29 MED ORDER — ROPINIROLE HCL 0.5 MG PO TABS: 0.5000 mg | ORAL_TABLET | Freq: Every day | ORAL | 5 refills | Status: DC

## 2020-05-29 NOTE — Telephone Encounter (Signed)
Pt returned phone call. Would like a call back. 

## 2020-05-29 NOTE — Telephone Encounter (Signed)
Called pt back. Pt mentioned that she thinks she may have RLS. Has no pain. She is unable to relax her muscles when she lays down at night. Legs twitch/jerk for about 30-54min. Sx started several months ago. Has not worsened but she is concerned and wondering what Dr. Felecia Shelling would recommend. I updated her med list in epic. Advised I will speak with MD and call her back.

## 2020-05-29 NOTE — Telephone Encounter (Addendum)
Per Dr. Felecia Shelling, can try requip 0.5mg  nightly #30, 5 refills. I called pt. She is agreeable to this. [facility] per pt request.

## 2020-05-29 NOTE — Telephone Encounter (Signed)
Notes from [employer] on [employer].

## 2020-05-29 NOTE — Telephone Encounter (Signed)
Pt called stating that her legs are giving her problems when she goes to bed at night and she is wanting to know if she possibly has RLS. Please advise.

## 2020-05-29 NOTE — Telephone Encounter (Signed)
LVM returning pt call. 

## 2020-05-30 DIAGNOSIS — G4700 Insomnia, unspecified: Secondary | ICD-10-CM | POA: Diagnosis not present

## 2020-05-30 DIAGNOSIS — E7800 Pure hypercholesterolemia, unspecified: Secondary | ICD-10-CM | POA: Diagnosis not present

## 2020-05-30 DIAGNOSIS — K219 Gastro-esophageal reflux disease without esophagitis: Secondary | ICD-10-CM | POA: Diagnosis not present

## 2020-05-30 DIAGNOSIS — I10 Essential (primary) hypertension: Secondary | ICD-10-CM | POA: Diagnosis not present

## 2020-05-30 NOTE — Telephone Encounter (Signed)
Called pt back. She is worried about potential SE. She has not taken medication yet. I went over concerns w/ her. Encouraged her to try medication. If she does experience any SE, she should let our office know. She verbalized understanding and appreciation.

## 2020-05-30 NOTE — Telephone Encounter (Signed)
Pt.'s states due to the side effects, she will not be taking requip for restless legs.

## 2020-06-01 NOTE — Telephone Encounter (Signed)
Pt. asks for RN to call her to discuss requip 0.5mg  questions. Please advise.

## 2020-06-04 NOTE — Telephone Encounter (Signed)
Called pt back. She realized she needs to take medication 30-45min prior to bedtime. It is working well for her and she is tolerating well. Nothing further needed.

## 2020-06-06 ENCOUNTER — Telehealth: Payer: Self-pay | Admitting: Neurology

## 2020-06-06 NOTE — Telephone Encounter (Signed)
Called pt back. She has not been satisfied w/ [facility] labels on medication bottles. She cannot read easily and gets confused. She says they have tried to bold text but this did not work. She is having them transfer all prescriptions back to [facility]. She just wanted our office to be aware and advised her PCP office is aware as well. I updated pharmacy in her chart.

## 2020-06-06 NOTE — Telephone Encounter (Signed)
Pt called, would like to change pharmacy from [place] to [facility]. Send refill for rOPINIRole (REQUIP) 0.5 MG tablet to [facility]. Would like a call from the nurse.

## 2020-06-07 DIAGNOSIS — Z01812 Encounter for preprocedural laboratory examination: Secondary | ICD-10-CM | POA: Diagnosis not present

## 2020-06-07 NOTE — Telephone Encounter (Signed)
Pt is asking for a call back from [place], RN to discuss additional concerns she has about  rOPINIRole (REQUIP) 0.5 MG tablet

## 2020-06-07 NOTE — Telephone Encounter (Signed)
Called and spoke with pt. She just wanted to apologize about the other day. She wanted to make sure everything was ok, she felt there was a misunderstanding about her pharmacy. Advised everything was fine. Glad we were able to get everything straightened out. She does not have any concerns about requip. Nothing further needed at this time.

## 2020-06-12 ENCOUNTER — Telehealth: Payer: Self-pay | Admitting: Neurology

## 2020-06-12 DIAGNOSIS — D123 Benign neoplasm of transverse colon: Secondary | ICD-10-CM | POA: Diagnosis not present

## 2020-06-12 DIAGNOSIS — D122 Benign neoplasm of ascending colon: Secondary | ICD-10-CM | POA: Diagnosis not present

## 2020-06-12 DIAGNOSIS — D124 Benign neoplasm of descending colon: Secondary | ICD-10-CM | POA: Diagnosis not present

## 2020-06-12 DIAGNOSIS — Z86010 Personal history of colonic polyps: Secondary | ICD-10-CM | POA: Diagnosis not present

## 2020-06-12 DIAGNOSIS — D120 Benign neoplasm of cecum: Secondary | ICD-10-CM | POA: Diagnosis not present

## 2020-06-12 DIAGNOSIS — K648 Other hemorrhoids: Secondary | ICD-10-CM | POA: Diagnosis not present

## 2020-06-12 DIAGNOSIS — K5730 Diverticulosis of large intestine without perforation or abscess without bleeding: Secondary | ICD-10-CM | POA: Diagnosis not present

## 2020-06-12 MED ORDER — SERTRALINE HCL 100 MG PO TABS
100.0000 mg | ORAL_TABLET | Freq: Every day | ORAL | 3 refills | Status: DC
Start: 2020-06-12 — End: 2020-08-13

## 2020-06-12 MED ORDER — BUSPIRONE HCL 15 MG PO TABS: ORAL_TABLET | ORAL | 3 refills | Status: DC

## 2020-06-12 MED ORDER — GABAPENTIN 100 MG PO CAPS: ORAL_CAPSULE | ORAL | 11 refills | Status: DC

## 2020-06-12 MED ORDER — BACLOFEN 10 MG PO TABS: ORAL_TABLET | ORAL | 11 refills | Status: DC

## 2020-06-12 NOTE — Telephone Encounter (Signed)
Called [facility] and spoke with Tera. States pt transferred all rx's last week to [place]. Called [facility] and spoke w/ Colletta Maryland. Confirmed they are needing new rx called in for her baclofen. Advised we will send this in. I e-scribed.

## 2020-06-12 NOTE — Telephone Encounter (Signed)
LVM Pt requesting refill baclofen (LIORESAL) 10 MG tablet at [facility]

## 2020-06-13 ENCOUNTER — Telehealth: Payer: Self-pay | Admitting: Neurology

## 2020-06-13 NOTE — Telephone Encounter (Signed)
Called pt back. Informed her PCP writes this prescription. She should contact PCP for refill on this. She verbalized understanding.

## 2020-06-13 NOTE — Telephone Encounter (Signed)
Pt called needing a refill on her traMADol (ULTRAM) 50 MG tablet sent in to the [facility]

## 2020-06-15 DIAGNOSIS — D122 Benign neoplasm of ascending colon: Secondary | ICD-10-CM | POA: Diagnosis not present

## 2020-06-15 DIAGNOSIS — D123 Benign neoplasm of transverse colon: Secondary | ICD-10-CM | POA: Diagnosis not present

## 2020-06-15 DIAGNOSIS — D120 Benign neoplasm of cecum: Secondary | ICD-10-CM | POA: Diagnosis not present

## 2020-06-15 DIAGNOSIS — D124 Benign neoplasm of descending colon: Secondary | ICD-10-CM | POA: Diagnosis not present

## 2020-06-20 ENCOUNTER — Telehealth: Payer: Self-pay | Admitting: Neurology

## 2020-06-20 ENCOUNTER — Encounter: Payer: Self-pay | Admitting: Neurology

## 2020-06-20 ENCOUNTER — Ambulatory Visit (INDEPENDENT_AMBULATORY_CARE_PROVIDER_SITE_OTHER): Payer: Medicare Other | Admitting: Neurology

## 2020-06-20 VITALS — BP 159/79 | HR 70 | Ht 61.0 in | Wt 141.5 lb

## 2020-06-20 DIAGNOSIS — R269 Unspecified abnormalities of gait and mobility: Secondary | ICD-10-CM

## 2020-06-20 DIAGNOSIS — G35 Multiple sclerosis: Secondary | ICD-10-CM | POA: Diagnosis not present

## 2020-06-20 DIAGNOSIS — R5383 Other fatigue: Secondary | ICD-10-CM | POA: Diagnosis not present

## 2020-06-20 DIAGNOSIS — M25552 Pain in left hip: Secondary | ICD-10-CM

## 2020-06-20 DIAGNOSIS — R350 Frequency of micturition: Secondary | ICD-10-CM

## 2020-06-20 DIAGNOSIS — M5432 Sciatica, left side: Secondary | ICD-10-CM | POA: Diagnosis not present

## 2020-06-20 MED ORDER — ALPRAZOLAM 0.5 MG PO TABS: ORAL_TABLET | ORAL | 0 refills | Status: DC

## 2020-06-20 NOTE — Progress Notes (Signed)
[facility]  PATIENT: [name] DOB: February 21, 1956  REFERRING DOCTOR OR PCP:  Dr. Leta Baptist  SOURCE: patient, MRI images on PACS, imaging reports, notes in EPIC and labs in EPIC  _________________________________   HISTORICAL  CHIEF COMPLAINT:  Chief Complaint  Patient presents with  . Follow-up    RM 12 with sister, Wynona Canes. Last seen 01/11/2020. Off DMT for MS. Ambulated with rolling walker. Slipped while going into the bathroom a few days ago but caught herself.     HISTORY OF PRESENT ILLNESS:  [name] is a 65 y.o. woman with relapsing remitting multiple sclerosis diagnosed in 1983  Update 06/20/2020: In 2021, Betaseron was discontinued.  She has had no exacerbation or major change in symptoms.    She had an MRI early 2021 that shows extensive T2/FLAIR hyperintense foci in the hemispheres and a few foci in the pons and cerebellum in a pattern and configuration consistent with chronic demyelinating plaque associated with multiple sclerosis.  There could also be superimposed foci of chronic microvascular ischemic change.  She feels her memory is a little worse and she sometimes has trouble coming up with the right words.  Cognitive issues have slowly progress prior to discontinuing the Betaseron and all progressing at about the same rate.  She has had some weight loss (10-15 pounds).  She is not sure why as appetite seems the same.   She had a recent colonoscopy and had 9 polyps.     She is feeling weaker and has needed to rely on her walker.   Balance is also off.  She has had a fall when legs just gave out.      She has some spasticity and takes baclofen.   She has urinary frequency/urgency.   Solifenacin does not seem to help the bladder much and makes her mouth dry.  There has RLS and takes gabapentin and ropinirole with some benefit.    She has anxiety and is on combination of sertraline and buspar. She is looking for a therapist.   Due to insomnia she  also takes trazodone at bedtime..   She has reduced short term memory and reduced attention and some word finding ererors.    She notes left hip and buttock pain.     MS History:    In 1983, while at work, she was having difficulty with her gait and was unsteady and falling. She R doctor and was referred to Dr. Morrell Riddle of [facility] neurology who diagnosed her with multiple sclerosis. She was in the Betaseron drug study (was on placebo) with Dr. Terie Purser in [place]. When Betaseron became available in 1992 or 1993 she started the medication and was seen Dr. Erling Cruz. Over the next few years she had a couple exacerbations that involved leg function, gait or balance. She had some treatments with IV steroids. She also has had some slow worsening with her gait more recently.   MRI Review:   MRI of the brain and cervical spine from 07/25/2013. In the brain, there are extensive T2/FLAIR hyperintense foci involving the cerebellum, left posterior medulla, there was a normal enhancement pattern. The cervical spine showed hyperintense foci adjacent to C3, C5-C6, C6, and T1. Right posterior pons, bilateral hemispheres, predominantly in the periventricular cortical white matter.   None of these enhanced.  MRI of the brain March 2020 showed Extensive T2/FLAIR hyperintense foci in the hemispheres and a few foci in the pons and cerebellum in a pattern and configuration consistent with chronic demyelinating  plaque associated with multiple sclerosis.  There could also be superimposed foci of chronic microvascular ischemic change.  The MRI is essentially unchanged compared to 2011    REVIEW OF SYSTEMS: Constitutional: No fevers, chills, sweats, or change in appetite.  Fatigue and sleepiness.  She has maintenance insomnia Eyes: No visual changes, double vision, eye pain.   Some fatigue. She has insomnia helped by trazodone. Ear, nose and throat: No hearing loss, ear pain, nasal congestion, sore throat Cardiovascular: No  chest pain, palpitations Respiratory: No shortness of breath at rest or with exertion.   No wheezes.   She snores GastrointestinaI: No nausea, vomiting, diarrhea, abdominal pain, fecal incontinence Genitourinary: as above Musculoskeletal: No neck pain, back pain Integumentary: No rash, pruritus, skin lesions Neurological: as above Psychiatric: No depression at this time.  No anxiety Endocrine: No palpitations, diaphoresis, change in appetite, change in weigh or increased thirst Hematologic/Lymphatic: No anemia, purpura, petechiae. Allergic/Immunologic: No itchy/runny eyes, nasal congestion, recent allergic reactions, rashes  ALLERGIES: Allergies  Allergen Reactions  . Other Other (See Comments)    Pt states she took a medication 2-3 yrs ago that may have been for inflammation and it caused her to be weak.  Marland Kitchen Myrbetriq [Mirabegron]     Caused her to dry out/nose bleeds    HOME MEDICATIONS:  Current Outpatient Medications:  .  acetaminophen (TYLENOL) 500 MG tablet, Take 500 mg by mouth every 6 (six) hours as needed., Disp: , Rfl:  .  ALPRAZolam (XANAX) 0.5 MG tablet, Take 2-3 before the MRI, Disp: 3 tablet, Rfl: 0 .  amLODipine (NORVASC) 10 MG tablet, Take 10 mg by mouth daily., Disp: , Rfl:  .  atorvastatin (LIPITOR) 20 MG tablet, Take 20 mg by mouth daily., Disp: , Rfl:  .  baclofen (LIORESAL) 10 MG tablet, Take 2 tablets by mouth at bedtime, Disp: 90 tablet, Rfl: 11 .  busPIRone (BUSPAR) 15 MG tablet, TAKE 1/2 TABLET BY MOUTH IN THE MORNING AND 1 TABLET IN THE EVENING, Disp: 135 tablet, Rfl: 3 .  esomeprazole (NEXIUM) 40 MG capsule, Take 40 mg by mouth daily., Disp: , Rfl:  .  famotidine (PEPCID) 20 MG tablet, Take 20 mg by mouth at bedtime., Disp: , Rfl:  .  fluticasone (FLONASE) 50 MCG/ACT nasal spray, Place 2 sprays into the nose as needed. For stuffy nose., Disp: , Rfl:  .  gabapentin (NEURONTIN) 100 MG capsule, Take 1-2 capsules by mouth at bedtime, Disp: 60 capsule, Rfl:  11 .  ibuprofen (ADVIL,MOTRIN) 600 MG tablet, Take 1 tablet by mouth every 8 (eight) hours as needed for mild pain or moderate pain. , Disp: , Rfl:  .  Loperamide HCl (ANTI-DIARRHEAL PO), Take 2 mg by mouth as needed., Disp: , Rfl:  .  losartan-hydrochlorothiazide (HYZAAR) 100-25 MG tablet, Take 1 tablet by mouth daily., Disp: , Rfl:  .  meclizine (ANTIVERT) 25 MG tablet, Take 25 mg by mouth as needed for dizziness., Disp: , Rfl:  .  montelukast (SINGULAIR) 10 MG tablet, Take 10 mg by mouth at bedtime., Disp: , Rfl:  .  nystatin (MYCOSTATIN/NYSTOP) powder, Apply topically 4 (four) times daily., Disp: , Rfl:  .  ondansetron (ZOFRAN) 4 MG tablet, Take 4-8 mg by mouth every 6 (six) hours as needed for nausea or vomiting., Disp: , Rfl:  .  potassium chloride (K-DUR) 10 MEQ tablet, Take 10 mEq by mouth daily., Disp: , Rfl:  .  rOPINIRole (REQUIP) 0.5 MG tablet, Take 1 tablet (0.5 mg total) by  mouth at bedtime., Disp: 30 tablet, Rfl: 5 .  sertraline (ZOLOFT) 100 MG tablet, Take 1 tablet (100 mg total) by mouth daily., Disp: 90 tablet, Rfl: 3 .  solifenacin (VESICARE) 10 MG tablet, Take 1 tablet (10 mg total) by mouth daily., Disp: 90 tablet, Rfl: 0 .  traMADol (ULTRAM) 50 MG tablet, Take 1 tablet (50 mg total) by mouth daily., Disp: 30 tablet, Rfl: 0 .  traZODone (DESYREL) 100 MG tablet, Take 100 mg by mouth at bedtime., Disp: , Rfl:   PAST MEDICAL HISTORY: Past Medical History:  Diagnosis Date  . Anemia    HX  . Anxiety   . Arthritis   . Chronic kidney disease    UTI'S  . Complication of anesthesia    BP RISES WHEN AWAKENING  . Complication of anesthesia    "difficult waking up and can become combative"   . GERD (gastroesophageal reflux disease)   . H/O hiatal hernia   . Headache(784.0)   . Heart murmur   . Hypertension   . Lumbar spinal stenosis   . Lumbar spondylolysis   . MS (multiple sclerosis) ([place])   . Neuromuscular disorder ([place])    MS  . Urinary frequency   . Wears glasses      PAST SURGICAL HISTORY: Past Surgical History:  Procedure Laterality Date  . CHOLECYSTECTOMY    . EYE SURGERY     BLEPHAROPLASTY  . LUMBAR FUSION  05/10/2015  . POSTERIOR FUSION LUMBAR SPINE  04/16/11   L4-5  . TRANSFORAMINAL LUMBAR INTERBODY FUSION (TLIF) WITH PEDICLE SCREW FIXATION 1 LEVEL N/A 05/10/2015   Procedure: TRANSFORAMINAL LUMBAR INTERBODY FUSION (TLIF) WITH PEDICLE SCREW FIXATION L3-4;  Surgeon: Eustace Moore, MD;  Location: [facility];  Service: Neurosurgery;  Laterality: N/A;  TRANSFORAMINAL LUMBAR INTERBODY FUSION (TLIF) WITH PEDICLE SCREW FIXATION L3-4  . UTERINE FIBROID EMBOLIZATION      FAMILY HISTORY: Family History  Problem Relation Age of Onset  . Lung cancer Mother   . Emphysema Father   . Heart disease Other     SOCIAL HISTORY:  Social History   Socioeconomic History  . Marital status: Single    Spouse name: Not on file  . Number of children: 0  . Years of education: 19  . Highest education level: Not on file  Occupational History    Comment: Does not work - Disabled  Tobacco Use  . Smoking status: Former Smoker    Packs/day: 2.00    Quit date: 04/08/1996    Years since quitting: 24.2  . Smokeless tobacco: Never Used  . Tobacco comment: ALCOHOL IN PAST  Substance and Sexual Activity  . Alcohol use: No  . Drug use: No  . Sexual activity: Yes    Birth control/protection: Post-menopausal  Other Topics Concern  . Not on file  Social History Narrative   Patient does not work she is disabled. and lives at home. Patient is single.   Caffeine: 1-2 cups daily depending on the day   Right handed.   Social Determinants of Health   Financial Resource Strain: Not on file  Food Insecurity: Not on file  Transportation Needs: Not on file  Physical Activity: Not on file  Stress: Not on file  Social Connections: Not on file  Intimate Partner Violence: Not on file     PHYSICAL EXAM  Vitals:   06/20/20 0947  BP: (!) 159/79  Pulse: 70   SpO2: 98%  Weight: 141 lb 8 oz (64.2 kg)  Height: 5\' 1"  (1.549 m)    Body mass index is 26.74 kg/m.    General: The patient is well-developed and well-nourished and in no acute distress.  Tender over left pirifomis muscle > trochanteric bursa.   Lumbar ok.  Right side ok.    Skin: Extremities are without rash or edema.  Neurologic Exam  Mental status: The patient is alert and oriented x 3 at the time of the examination. The patient has apparent normal recent and remote memory, with an apparently normal attention span and concentration ability.   Speech is normal.  Cranial nerves: Extraocular movements are full.  Facial strength and sensation was normal.  Trapezius strength is normal.  No obvious hearing deficits are noted.  Motor:  Muscle bulk is normal.  Muscle tone is increased in the legs.  Strength was 5/5  Sensory: Normal touch and vibration sensation in limbs.    Coordination: She has good finger-nose-finger bilaterally.  Heel-to-shin is mildly reduced  Gait and station: Station is normal.  Gait is mildly wide.  She can walk without support, better with support.  She cannot do a tandem walk.   Romberg is borderline.   Reflexes: Deep tendon reflexes are symmetric and normal bilaterally.       ASSESSMENT AND PLAN  Multiple sclerosis ([place]) - Plan: Ambulatory referral to Physical Therapy, MR BRAIN W WO CONTRAST  Gait disturbance - Plan: Ambulatory referral to Physical Therapy, MR BRAIN W WO CONTRAST  Left hip pain - Plan: Ambulatory referral to Physical Therapy  Sciatica, left side  Gait disorder  Urinary frequency  Other fatigue  1.   She will remain off of a disease modifying therapy for MS.  I will check an MRI of the brain and we will consider reinitiation of treatment if she has significant change. 2.   She is tender over the left piriformis muscle.  I did a left piriformis trigger point injection with 60 mg Depo-Medrol in 3 cc Marcaine.  She tolerated  procedure well and there were no complications.   3.   Stay active.  Exercise as tolerated 4,    She will return in 6 months or sooner if there are new or worsening neurologic symptoms.  Eduard Penkala A. Felecia Shelling, MD, PhD 9/48/0165, 5:37 PM Certified in Neurology, Clinical Neurophysiology, Sleep Medicine, Pain Medicine and Neuroimaging  [facility] [address] [place], [place] [address] [phone]

## 2020-06-20 NOTE — Telephone Encounter (Signed)
[employer] medicare order sent to [facility]. No auth they will reach out to the patient to schedule.  

## 2020-06-25 DIAGNOSIS — G35 Multiple sclerosis: Secondary | ICD-10-CM | POA: Diagnosis not present

## 2020-06-25 DIAGNOSIS — G2581 Restless legs syndrome: Secondary | ICD-10-CM | POA: Diagnosis not present

## 2020-06-25 DIAGNOSIS — R531 Weakness: Secondary | ICD-10-CM | POA: Diagnosis not present

## 2020-06-25 DIAGNOSIS — R350 Frequency of micturition: Secondary | ICD-10-CM | POA: Diagnosis not present

## 2020-06-25 DIAGNOSIS — M25552 Pain in left hip: Secondary | ICD-10-CM | POA: Diagnosis not present

## 2020-06-25 DIAGNOSIS — R2681 Unsteadiness on feet: Secondary | ICD-10-CM | POA: Diagnosis not present

## 2020-06-27 ENCOUNTER — Telehealth: Payer: Self-pay | Admitting: Neurology

## 2020-06-27 MED ORDER — ROPINIROLE HCL 0.5 MG PO TABS: ORAL_TABLET | ORAL | 5 refills | Status: DC

## 2020-06-27 NOTE — Telephone Encounter (Signed)
Spoke with Dr. Felecia Shelling. Ok to have her increase from 0.5mg  po qhs to BID (morning and bedtime)

## 2020-06-27 NOTE — Telephone Encounter (Signed)
I called pt back. Confirmed she should take 1 in the morning and 1 at bedtime. Rx sent to [facility]. She verbalized understanding.

## 2020-06-27 NOTE — Telephone Encounter (Signed)
Pt called, I'm having problem with my legs early in the day. Can I take rOPINIRole (REQUIP) 0.5 MG tablet early in the day. Would like a call from the nurse.

## 2020-06-27 NOTE — Telephone Encounter (Signed)
Pt called requesting for RN to call her back when available. Pt was informed to listen to the VM RN left her regarding her medication and which pharmacy it was called in to.

## 2020-06-27 NOTE — Telephone Encounter (Signed)
Left detailed message for pt relaying Dr. Garth Bigness recommendation. Advised we will send new rx to pharmacy for her. Advised her to call back if she has any further questions. E-scribed rx to [facility].

## 2020-06-29 ENCOUNTER — Telehealth: Payer: Self-pay | Admitting: Neurology

## 2020-06-29 NOTE — Telephone Encounter (Signed)
Pt called wanting to speak to the RN about the situation she is having with her PT. Pt states she is not satisfied with them due to them wanting to do things at there convenience and not at her convenience. Please advise.

## 2020-06-30 ENCOUNTER — Other Ambulatory Visit: Payer: Self-pay

## 2020-06-30 ENCOUNTER — Ambulatory Visit
Admission: RE | Admit: 2020-06-30 | Discharge: 2020-06-30 | Disposition: A | Discharge status: Home / Self Care | Payer: Medicare Other | Source: Ambulatory Visit | Attending: Neurology | Admitting: Neurology

## 2020-06-30 DIAGNOSIS — G35 Multiple sclerosis: Secondary | ICD-10-CM | POA: Diagnosis not present

## 2020-06-30 DIAGNOSIS — R269 Unspecified abnormalities of gait and mobility: Secondary | ICD-10-CM

## 2020-06-30 MED ORDER — GADOBENATE DIMEGLUMINE 529 MG/ML IV SOLN
15.0000 mL | Freq: Once | INTRAVENOUS | Status: AC | PRN
  Administered 2020-06-30: 15 mL via INTRAVENOUS

## 2020-07-02 ENCOUNTER — Telehealth: Payer: Self-pay | Admitting: *Deleted

## 2020-07-02 ENCOUNTER — Other Ambulatory Visit: Payer: Self-pay | Admitting: *Deleted

## 2020-07-02 NOTE — Telephone Encounter (Signed)
Called pt back. She worked out issues with Physical therapy. Nothing further needed.

## 2020-07-02 NOTE — Telephone Encounter (Signed)
-----   Message from Britt Bottom, MD sent at 06/30/2020 12:23 PM EDT ----- Please let her know that the MRI of the brain looks good.  We see the old MS lesions but there are no new ones compared to the MRI from last year.

## 2020-07-02 NOTE — Telephone Encounter (Signed)
Spoke with pt about MRI results per Dr. Garth Bigness note.Pt verbalized understanding.  She states legs intermittently get hot when she lays down at night to go to sleep. She will continue to monitor. If it worsens or becomes more constant, she will let us know.

## 2020-07-05 DIAGNOSIS — I493 Ventricular premature depolarization: Secondary | ICD-10-CM | POA: Diagnosis not present

## 2020-07-05 DIAGNOSIS — I499 Cardiac arrhythmia, unspecified: Secondary | ICD-10-CM | POA: Diagnosis not present

## 2020-07-09 ENCOUNTER — Other Ambulatory Visit: Payer: Self-pay

## 2020-07-09 ENCOUNTER — Ambulatory Visit: Payer: Medicare Other | Admitting: Podiatry

## 2020-07-09 ENCOUNTER — Ambulatory Visit (INDEPENDENT_AMBULATORY_CARE_PROVIDER_SITE_OTHER): Payer: Medicare Other | Admitting: Podiatry

## 2020-07-09 DIAGNOSIS — M2041 Other hammer toe(s) (acquired), right foot: Secondary | ICD-10-CM

## 2020-07-09 DIAGNOSIS — M21619 Bunion of unspecified foot: Secondary | ICD-10-CM

## 2020-07-09 DIAGNOSIS — M79672 Pain in left foot: Secondary | ICD-10-CM | POA: Diagnosis not present

## 2020-07-09 DIAGNOSIS — M2042 Other hammer toe(s) (acquired), left foot: Secondary | ICD-10-CM | POA: Diagnosis not present

## 2020-07-09 DIAGNOSIS — M79671 Pain in right foot: Secondary | ICD-10-CM

## 2020-07-11 DIAGNOSIS — M25552 Pain in left hip: Secondary | ICD-10-CM | POA: Diagnosis not present

## 2020-07-11 DIAGNOSIS — G35 Multiple sclerosis: Secondary | ICD-10-CM | POA: Diagnosis not present

## 2020-07-11 DIAGNOSIS — R2681 Unsteadiness on feet: Secondary | ICD-10-CM | POA: Diagnosis not present

## 2020-07-11 DIAGNOSIS — R350 Frequency of micturition: Secondary | ICD-10-CM | POA: Diagnosis not present

## 2020-07-11 DIAGNOSIS — G2581 Restless legs syndrome: Secondary | ICD-10-CM | POA: Diagnosis not present

## 2020-07-11 DIAGNOSIS — R531 Weakness: Secondary | ICD-10-CM | POA: Diagnosis not present

## 2020-07-11 NOTE — Progress Notes (Signed)
Subjective:   Patient ID: [name], female   DOB: 65 y.o.   MRN: [id number]   HPI 65 year old female presents the office today for concerns of her second toes rubbing causing discomfort on the medial aspects.  This is on both feet which causes her to have difficulty walking.  She does have MS for over 40 years.  She recently she will start using a toe separator yesterday which has been helpful.  No recent injury or treatment otherwise.  No other concerns.   Review of Systems  All other systems reviewed and are negative.  Past Medical History:  Diagnosis Date  . Anemia    HX  . Anxiety   . Arthritis   . Chronic kidney disease    UTI'S  . Complication of anesthesia    BP RISES WHEN AWAKENING  . Complication of anesthesia    "difficult waking up and can become combative"   . GERD (gastroesophageal reflux disease)   . H/O hiatal hernia   . Headache(784.0)   . Heart murmur   . Hypertension   . Lumbar spinal stenosis   . Lumbar spondylolysis   . MS (multiple sclerosis) ([place])   . Neuromuscular disorder ([place])    MS  . Urinary frequency   . Wears glasses     Past Surgical History:  Procedure Laterality Date  . CHOLECYSTECTOMY    . EYE SURGERY     BLEPHAROPLASTY  . LUMBAR FUSION  05/10/2015  . POSTERIOR FUSION LUMBAR SPINE  04/16/11   L4-5  . TRANSFORAMINAL LUMBAR INTERBODY FUSION (TLIF) WITH PEDICLE SCREW FIXATION 1 LEVEL N/A 05/10/2015   Procedure: TRANSFORAMINAL LUMBAR INTERBODY FUSION (TLIF) WITH PEDICLE SCREW FIXATION L3-4;  Surgeon: Eustace Moore, MD;  Location: [facility];  Service: Neurosurgery;  Laterality: N/A;  TRANSFORAMINAL LUMBAR INTERBODY FUSION (TLIF) WITH PEDICLE SCREW FIXATION L3-4  . UTERINE FIBROID EMBOLIZATION       Current Outpatient Medications:  .  acetaminophen (TYLENOL) 500 MG tablet, Take 500 mg by mouth every 6 (six) hours as needed., Disp: , Rfl:  .  amLODipine (NORVASC) 10 MG tablet, Take 10 mg by mouth daily., Disp: , Rfl:  .  atorvastatin  (LIPITOR) 20 MG tablet, Take 20 mg by mouth daily., Disp: , Rfl:  .  baclofen (LIORESAL) 10 MG tablet, Take 2 tablets by mouth at bedtime, Disp: 90 tablet, Rfl: 11 .  benzonatate (TESSALON) 200 MG capsule, Take 200 mg by mouth 3 (three) times daily as needed., Disp: , Rfl:  .  busPIRone (BUSPAR) 15 MG tablet, TAKE 1/2 TABLET BY MOUTH IN THE MORNING AND 1 TABLET IN THE EVENING, Disp: 135 tablet, Rfl: 3 .  esomeprazole (NEXIUM) 40 MG capsule, Take 40 mg by mouth daily., Disp: , Rfl:  .  famotidine (PEPCID) 20 MG tablet, Take 20 mg by mouth at bedtime., Disp: , Rfl:  .  fluticasone (FLONASE) 50 MCG/ACT nasal spray, Place 2 sprays into the nose as needed. For stuffy nose., Disp: , Rfl:  .  gabapentin (NEURONTIN) 100 MG capsule, Take 1-2 capsules by mouth at bedtime, Disp: 60 capsule, Rfl: 11 .  hydrochlorothiazide (HYDRODIURIL) 25 MG tablet, Take 1 tablet by mouth daily., Disp: , Rfl:  .  ibuprofen (ADVIL,MOTRIN) 600 MG tablet, Take 1 tablet by mouth every 8 (eight) hours as needed for mild pain or moderate pain. , Disp: , Rfl:  .  Loperamide HCl (ANTI-DIARRHEAL PO), Take 2 mg by mouth as needed., Disp: , Rfl:  .  losartan (COZAAR) 100 MG tablet, Take 1 tablet by mouth daily., Disp: , Rfl:  .  losartan-hydrochlorothiazide (HYZAAR) 100-25 MG tablet, Take 1 tablet by mouth daily., Disp: , Rfl:  .  meclizine (ANTIVERT) 25 MG tablet, Take 25 mg by mouth as needed for dizziness., Disp: , Rfl:  .  montelukast (SINGULAIR) 10 MG tablet, Take 10 mg by mouth at bedtime., Disp: , Rfl:  .  mupirocin ointment (BACTROBAN) 2 %, mupirocin 2 % topical ointment  APPLY A SMALL AMOUNT TO THE AFFECTED AREA BY TOPICAL ROUTE 3 TIMES PER DAY, Disp: , Rfl:  .  nystatin (MYCOSTATIN/NYSTOP) powder, Apply topically 4 (four) times daily., Disp: , Rfl:  .  nystatin cream (MYCOSTATIN), Apply 1 application topically 2 (two) times daily., Disp: , Rfl:  .  ondansetron (ZOFRAN) 4 MG tablet, Take 4-8 mg by mouth every 6 (six) hours as  needed for nausea or vomiting., Disp: , Rfl:  .  potassium chloride (K-DUR) 10 MEQ tablet, Take 10 mEq by mouth daily., Disp: , Rfl:  .  RESTASIS 0.05 % ophthalmic emulsion, 1 drop 2 (two) times daily., Disp: , Rfl:  .  rOPINIRole (REQUIP) 0.5 MG tablet, Take 1 tablet in the morning and 1 tablet at bedtime, Disp: 60 tablet, Rfl: 5 .  sertraline (ZOLOFT) 100 MG tablet, Take 1 tablet (100 mg total) by mouth daily., Disp: 90 tablet, Rfl: 3 .  solifenacin (VESICARE) 10 MG tablet, Take 1 tablet (10 mg total) by mouth daily., Disp: 90 tablet, Rfl: 0 .  SUDOGEST 30 MG tablet, Take 30 mg by mouth., Disp: , Rfl:  .  traMADol (ULTRAM) 50 MG tablet, Take 1 tablet (50 mg total) by mouth daily., Disp: 30 tablet, Rfl: 0 .  traZODone (DESYREL) 100 MG tablet, Take 100 mg by mouth at bedtime., Disp: , Rfl:   Allergies  Allergen Reactions  . Other Other (See Comments)    Pt states she took a medication 2-3 yrs ago that may have been for inflammation and it caused her to be weak.  Marland Kitchen Myrbetriq [Mirabegron]     Caused her to dry out/nose bleeds         Objective:  Physical Exam  General: AAO x3, NAD  Dermatological: Skin is warm, dry and supple bilateral. There are no open sores, no preulcerative lesions, no rash or signs of infection present.  Vascular: Dorsalis Pedis artery and Posterior Tibial artery pedal pulses are 2/4 bilateral with immedate capillary fill time. There is no pain with calf compression, swelling, warmth, erythema.   Neruologic: Grossly intact via light touch bilateral.   Musculoskeletal: Bunion is present as well as lesser digit transverse plane deformity resulting in irritation between the first and second toes.  There is no skin breakdown identified today there is no blisters or open lesions.       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  Insert with cancer treatment.  Dispensed various offloading pads.  Discussed shoe modifications as well.  Trula Slade DPM

## 2020-07-13 DIAGNOSIS — M25552 Pain in left hip: Secondary | ICD-10-CM | POA: Diagnosis not present

## 2020-07-13 DIAGNOSIS — G35 Multiple sclerosis: Secondary | ICD-10-CM | POA: Diagnosis not present

## 2020-07-13 DIAGNOSIS — R2681 Unsteadiness on feet: Secondary | ICD-10-CM | POA: Diagnosis not present

## 2020-07-13 DIAGNOSIS — R350 Frequency of micturition: Secondary | ICD-10-CM | POA: Diagnosis not present

## 2020-07-13 DIAGNOSIS — G2581 Restless legs syndrome: Secondary | ICD-10-CM | POA: Diagnosis not present

## 2020-07-13 DIAGNOSIS — R531 Weakness: Secondary | ICD-10-CM | POA: Diagnosis not present

## 2020-07-16 ENCOUNTER — Telehealth: Payer: Self-pay | Admitting: *Deleted

## 2020-07-16 DIAGNOSIS — M25552 Pain in left hip: Secondary | ICD-10-CM | POA: Diagnosis not present

## 2020-07-16 DIAGNOSIS — G35 Multiple sclerosis: Secondary | ICD-10-CM | POA: Diagnosis not present

## 2020-07-16 DIAGNOSIS — R531 Weakness: Secondary | ICD-10-CM | POA: Diagnosis not present

## 2020-07-16 DIAGNOSIS — R350 Frequency of micturition: Secondary | ICD-10-CM | POA: Diagnosis not present

## 2020-07-16 DIAGNOSIS — G2581 Restless legs syndrome: Secondary | ICD-10-CM | POA: Diagnosis not present

## 2020-07-16 DIAGNOSIS — R2681 Unsteadiness on feet: Secondary | ICD-10-CM | POA: Diagnosis not present

## 2020-07-16 NOTE — Telephone Encounter (Signed)
[facility] PT w/ [facility] called and obtained verbal orders per Dr Felecia Shelling to extend PT 1 time per week x 5 weeks.

## 2020-07-17 DIAGNOSIS — G35 Multiple sclerosis: Secondary | ICD-10-CM | POA: Diagnosis not present

## 2020-07-17 DIAGNOSIS — R2681 Unsteadiness on feet: Secondary | ICD-10-CM | POA: Diagnosis not present

## 2020-07-17 DIAGNOSIS — G2581 Restless legs syndrome: Secondary | ICD-10-CM | POA: Diagnosis not present

## 2020-07-17 DIAGNOSIS — M25552 Pain in left hip: Secondary | ICD-10-CM | POA: Diagnosis not present

## 2020-07-17 DIAGNOSIS — R350 Frequency of micturition: Secondary | ICD-10-CM | POA: Diagnosis not present

## 2020-07-17 DIAGNOSIS — R531 Weakness: Secondary | ICD-10-CM | POA: Diagnosis not present

## 2020-07-18 ENCOUNTER — Telehealth: Payer: Self-pay | Admitting: Neurology

## 2020-07-18 NOTE — Telephone Encounter (Signed)
Pt called wanting to speak to the RN about her weight and other things. Pt would not give any other information. Please advise.

## 2020-07-18 NOTE — Telephone Encounter (Signed)
LVM returning pt call. 

## 2020-07-18 NOTE — Telephone Encounter (Signed)
Pt returned call. Please call back when available. 

## 2020-07-18 NOTE — Telephone Encounter (Signed)
Called pt back. She is concerned about her losing a lot of weight. I instructed her to follow up with her primary care physician for further evaluation on this. She verbalized understanding.

## 2020-07-20 ENCOUNTER — Telehealth: Payer: Self-pay | Admitting: Podiatry

## 2020-07-20 NOTE — Telephone Encounter (Signed)
Toe separator is not helping, it is hard to stay on and she cannot wear shoes due to the pain.   I am going to look at getting her a toe separator that has a loop so it stays on better.   She does not want surgery but does not want this pain. Will try different separators to see if that will help.

## 2020-07-20 NOTE — Telephone Encounter (Signed)
Pt called stating she doesn't want any pain meds but she would like to speak with you about the pain she's feeling and any recommendations you may have. Please advise.

## 2020-07-24 DIAGNOSIS — R2681 Unsteadiness on feet: Secondary | ICD-10-CM | POA: Diagnosis not present

## 2020-07-24 DIAGNOSIS — M25552 Pain in left hip: Secondary | ICD-10-CM | POA: Diagnosis not present

## 2020-07-24 DIAGNOSIS — G2581 Restless legs syndrome: Secondary | ICD-10-CM | POA: Diagnosis not present

## 2020-07-24 DIAGNOSIS — R350 Frequency of micturition: Secondary | ICD-10-CM | POA: Diagnosis not present

## 2020-07-24 DIAGNOSIS — G35 Multiple sclerosis: Secondary | ICD-10-CM | POA: Diagnosis not present

## 2020-07-24 DIAGNOSIS — R531 Weakness: Secondary | ICD-10-CM | POA: Diagnosis not present

## 2020-07-24 NOTE — Telephone Encounter (Signed)
Toe spacers with loop put in the mail

## 2020-07-25 ENCOUNTER — Telehealth: Payer: Self-pay | Admitting: *Deleted

## 2020-07-25 NOTE — Telephone Encounter (Signed)
Patient is calling about her toe separators that she was supposed to receive.  Returned call and explained to patient that her separators were put in mail. She is also having some on /off burning sensations on bottom of her feet which just started today.She does not wish to come in because of transportation but would like doctor's advise, has been using baby powder in shoes, giving her some relief.

## 2020-07-27 ENCOUNTER — Ambulatory Visit: Payer: Medicare Other | Admitting: Cardiovascular Disease

## 2020-07-27 NOTE — Telephone Encounter (Signed)
I would make sure she is wearing shoes that are not too tight that could potentially put any excess pressure on the toes. Also, I would watch to make sure that the burning sensations do not go up the foot/leg. Let me know if it is becoming more consistent or keeping her awake at night.

## 2020-07-30 DIAGNOSIS — R2681 Unsteadiness on feet: Secondary | ICD-10-CM | POA: Diagnosis not present

## 2020-07-30 DIAGNOSIS — R350 Frequency of micturition: Secondary | ICD-10-CM | POA: Diagnosis not present

## 2020-07-30 DIAGNOSIS — G2581 Restless legs syndrome: Secondary | ICD-10-CM | POA: Diagnosis not present

## 2020-07-30 DIAGNOSIS — R531 Weakness: Secondary | ICD-10-CM | POA: Diagnosis not present

## 2020-07-30 DIAGNOSIS — M25552 Pain in left hip: Secondary | ICD-10-CM | POA: Diagnosis not present

## 2020-07-30 DIAGNOSIS — G35 Multiple sclerosis: Secondary | ICD-10-CM | POA: Diagnosis not present

## 2020-07-30 NOTE — Telephone Encounter (Signed)
Returned call to patient and gave information concerning burning feet per Dr Jacqualyn Posey, verbalized understanding his recommendations.

## 2020-08-01 ENCOUNTER — Telehealth: Payer: Self-pay | Admitting: *Deleted

## 2020-08-01 NOTE — Telephone Encounter (Signed)
Patient is calling about her toe spacers w/ loop. Per Dr Coy Saunas were mailed to patient. Informed patient and to call back if not received in a few days.

## 2020-08-08 DIAGNOSIS — R350 Frequency of micturition: Secondary | ICD-10-CM | POA: Diagnosis not present

## 2020-08-08 DIAGNOSIS — R2681 Unsteadiness on feet: Secondary | ICD-10-CM | POA: Diagnosis not present

## 2020-08-08 DIAGNOSIS — M25552 Pain in left hip: Secondary | ICD-10-CM | POA: Diagnosis not present

## 2020-08-08 DIAGNOSIS — R531 Weakness: Secondary | ICD-10-CM | POA: Diagnosis not present

## 2020-08-08 DIAGNOSIS — G2581 Restless legs syndrome: Secondary | ICD-10-CM | POA: Diagnosis not present

## 2020-08-08 DIAGNOSIS — G35 Multiple sclerosis: Secondary | ICD-10-CM | POA: Diagnosis not present

## 2020-08-09 ENCOUNTER — Telehealth: Payer: Self-pay | Admitting: Neurology

## 2020-08-09 NOTE — Telephone Encounter (Signed)
Pt is asking for a call from [place] to discuss her therapist concerns about her being on busPIRone (BUSPAR) 15 MG tablet & sertraline (ZOLOFT) 100 MG tablet.  Pt is aware that both Dr Felecia Shelling and Terrence Dupont, RN will not be back in office before next week, she will wait for a call from Terrence Dupont, [place] (she does not want a call from Work in Therapist, sports or Work in Dr)

## 2020-08-13 ENCOUNTER — Telehealth: Payer: Self-pay | Admitting: *Deleted

## 2020-08-13 ENCOUNTER — Other Ambulatory Visit: Payer: Self-pay | Admitting: *Deleted

## 2020-08-13 MED ORDER — BUSPIRONE HCL 15 MG PO TABS: ORAL_TABLET | ORAL | 3 refills | Status: DC

## 2020-08-13 MED ORDER — SERTRALINE HCL 100 MG PO TABS
150.0000 mg | ORAL_TABLET | Freq: Every day | ORAL | 3 refills | Status: DC
Start: 2020-08-13 — End: 2020-11-13

## 2020-08-13 NOTE — Telephone Encounter (Signed)
Called pt back. She is seeing Dr. Yvone Neu Fraiser/Psychologist. He wrote down suggestions for her medications/gave her the daily dose ranges for the her medications:Buspar 15-60mg  per day and Sertraline 50-200mg  per day. Wants her to increase dose of both but wanted Dr. Garth Bigness input.    She is currently taking: Buspar 15mg  1/2 in the am, 1 in the pm and Sertraline 100mg  po qd

## 2020-08-13 NOTE — Telephone Encounter (Signed)
Patient is calling to request more toe separators, unable to drive and would like them mailed.

## 2020-08-13 NOTE — Telephone Encounter (Signed)
Pt returned call. She would like RN to call her back as soon as available.

## 2020-08-13 NOTE — Telephone Encounter (Signed)
Have her increase the BuSpar to 1 pill twice a day.  She could also increase the Zoloft to 1-1/2 pills daily.

## 2020-08-13 NOTE — Telephone Encounter (Signed)
Called pt back. Relayed Dr. Garth Bigness recommendations. She is agreeable to plan. I e-scribed new prescriptions to [facility]. She will let Dr. Lilli Light know of change. Nothing further needed.

## 2020-08-13 NOTE — Telephone Encounter (Signed)
I called pt to discuss. She has been seeing a therapist for 2 weeks. He thinks dose needs to be increased for one of her meds. She did not have her notes w/ her. She was not at home. She will call back once she is home to further discuss.

## 2020-08-13 NOTE — Telephone Encounter (Signed)
Pt called again and LVM needing to speak to the RN regarding her Buspar 15mg 

## 2020-08-13 NOTE — Telephone Encounter (Signed)
[name]- can you please mail them? Thanks.

## 2020-08-14 NOTE — Telephone Encounter (Signed)
Called and spoke with the patient and stated that I was mailing the spreaders out to the patient today. [name]

## 2020-08-15 DIAGNOSIS — G35 Multiple sclerosis: Secondary | ICD-10-CM | POA: Diagnosis not present

## 2020-08-15 DIAGNOSIS — R350 Frequency of micturition: Secondary | ICD-10-CM | POA: Diagnosis not present

## 2020-08-15 DIAGNOSIS — G2581 Restless legs syndrome: Secondary | ICD-10-CM | POA: Diagnosis not present

## 2020-08-15 DIAGNOSIS — R531 Weakness: Secondary | ICD-10-CM | POA: Diagnosis not present

## 2020-08-15 DIAGNOSIS — M25552 Pain in left hip: Secondary | ICD-10-CM | POA: Diagnosis not present

## 2020-08-15 DIAGNOSIS — R2681 Unsteadiness on feet: Secondary | ICD-10-CM | POA: Diagnosis not present

## 2020-08-16 ENCOUNTER — Telehealth: Payer: Self-pay | Admitting: Neurology

## 2020-08-16 MED ORDER — BACLOFEN 10 MG PO TABS: 10.0000 mg | ORAL_TABLET | Freq: Three times a day (TID) | ORAL | 11 refills | Status: DC

## 2020-08-16 NOTE — Telephone Encounter (Signed)
[facility] [place]) called, need clarification on direction for baclofen (LIORESAL) 10 MG tablet.  Contact info: [phone]

## 2020-08-16 NOTE — Telephone Encounter (Addendum)
Spoke w/ Dr. Felecia Shelling. He would like her to take baclofen 10mg  po TID regularly to see if this helps sx. Called pt. Relayed Dr. Garth Bigness recommendation. She is agreeable to try this. I e-scribed new rx to [facility] per pt request.

## 2020-08-16 NOTE — Telephone Encounter (Signed)
Pt is asking for a call from Terrence Dupont, RN to discuss what Dr Felecia Shelling wants her to do re: her baclofen (LIORESAL) 10 MG tablet.  Pt is wanting to also discuss the MS causing pain in her feet.

## 2020-08-16 NOTE — Telephone Encounter (Addendum)
Called and spoke w. pt. She is concerned about her MS. She has been going to orthopaedic doctor d/t issues with her toes. Has been taking extra baclofen to help with pain. Pain started about 1-2 months ago. She has run low on her baclofen because of this. They have recommended surgery on her feet but she cannot afford this. They have given her toe separators that has helped. When she gets in the shower, foot pain increases. Wanting to know what MD suggest. Advised I will discuss w/ him and call back. MD prescribed baclofen 10mg  po, 2 po qhs but given #90. She sometimes takes 3 or more per day, but not everyday.   Ok to leave detailed message if she does not pick up when I call back. She is going out.

## 2020-08-16 NOTE — Addendum Note (Signed)
Addended by: Roberts Gaudy L on: 08/16/2020 11:02 AM   Modules accepted: Orders

## 2020-08-16 NOTE — Telephone Encounter (Signed)
Called back and clarified directions should be 1 po TID. She verbalized understanding.

## 2020-08-17 ENCOUNTER — Ambulatory Visit (INDEPENDENT_AMBULATORY_CARE_PROVIDER_SITE_OTHER): Payer: Medicare Other | Admitting: Cardiovascular Disease

## 2020-08-17 ENCOUNTER — Other Ambulatory Visit: Payer: Self-pay

## 2020-08-17 ENCOUNTER — Encounter: Payer: Self-pay | Admitting: Cardiovascular Disease

## 2020-08-17 VITALS — BP 138/76 | HR 64 | Ht 61.0 in | Wt 139.0 lb

## 2020-08-17 DIAGNOSIS — I493 Ventricular premature depolarization: Secondary | ICD-10-CM | POA: Diagnosis not present

## 2020-08-17 DIAGNOSIS — I10 Essential (primary) hypertension: Secondary | ICD-10-CM | POA: Diagnosis not present

## 2020-08-17 DIAGNOSIS — R012 Other cardiac sounds: Secondary | ICD-10-CM | POA: Diagnosis not present

## 2020-08-17 MED ORDER — METOPROLOL SUCCINATE ER 25 MG PO TB24: 25.0000 mg | ORAL_TABLET | Freq: Every day | ORAL | 1 refills | Status: DC

## 2020-08-17 NOTE — Patient Instructions (Signed)
Medication Instructions:  START Metoprolol Succinate 25 mg once daily  *If you need a refill on your cardiac medications before your next appointment, please call your pharmacy*   Lab Work: None ordered If you have labs (blood work) drawn today and your tests are completely normal, you will receive your results only by: Marland Kitchen MyChart Message (if you have MyChart) OR . A paper copy in the mail If you have any lab test that is abnormal or we need to change your treatment, we will call you to review the results.   Testing/Procedures: Your physician has requested that you have an echocardiogram. Echocardiography is a painless test that uses sound waves to create images of your heart. It provides your doctor with information about the size and shape of your heart and how well your heart's chambers and valves are working. You may receive an ultrasound enhancing agent through an IV if needed to better visualize your heart during the echo.This procedure takes approximately one hour. There are no restrictions for this procedure. This will take place at the [address].     Follow-Up: At [facility], you and your health needs are our priority.  As part of our continuing mission to provide you with exceptional heart care, we have created designated Provider Care Teams.  These Care Teams include your primary Cardiologist (physician) and Advanced Practice Providers (APPs -  Physician Assistants and Nurse Practitioners) who all work together to provide you with the care you need, when you need it.  We recommend signing up for the patient portal called "MyChart".  Sign up information is provided on this After Visit Summary.  MyChart is used to connect with patients for Virtual Visits (Telemedicine).  Patients are able to view lab/test results, encounter notes, upcoming appointments, etc.  Non-urgent messages can be sent to your provider as well.   To learn more about what you can do with  MyChart, go to [email or web address].    Your next appointment:   3 month(s)  The format for your next appointment:   In Person  Provider:   You may see Sanda Klein, MD or one of the following Advanced Practice Providers on your designated Care Team:    Almyra Deforest, PA-C  Fabian Sharp, PA-C or   Roby Lofts, [place]

## 2020-08-17 NOTE — Progress Notes (Signed)
Cardiology Office Note:    Date:  08/17/2020   ID:  [name], DOB 10-Dec-1955, MRN [id number]  PCP:  Carol Ada, MD  .[name] is a 65 y.o. female who is being seen today for the evaluation of palpitations at the request of Carol Ada, [place] Providers Cardiologist:  Sanda Klein, MD     Referring MD: Carol Ada, MD   Chief Complaint  Patient presents with  . New Patient (Initial Visit)    History of Present Illness:    [name] is a 65 y.o. female with a hx of multiple sclerosis, sciatica, anxiety, hypercholesterolemia, essential hypertension, presenting for complaints of palpitations.  She is bothered and concerned about occasional strong thump that she feels in her chest.  This is worse if she drinks coffee.  An electrocardiogram performed by Dr. Tamala Julian is interpreted as showing "frequent PVCs and bigeminy", unfortunately actual tracing is not available for review.  There is also mention of an episode of "bradycardia" with a heart rate of 40 bpm during the session of physical therapy.  The patient denies problems with dizziness or syncope.  She has not had problems with shortness of breath at rest or with activity, chest pain, lower extremity edema, focal neurological complaints or claudication.  She has memory issues.  Her sister is here with her today to help with that.  General dislike of any "high-tech" devices.  Her blood pressure is borderline high at 138/76 (134/77 a few days ago).  She is taking a statin and her most recent LDL cholesterol was 100, with an excellent HDL of 67.  She does not have diabetes mellitus and has not smoked in over 30 years.  Past Medical History:  Diagnosis Date  . Anemia    HX  . Anxiety   . Arthritis   . Chronic kidney disease    UTI'S  . Complication of anesthesia    BP RISES WHEN AWAKENING  . Complication of anesthesia    "difficult waking up and can become combative"   . GERD (gastroesophageal reflux  disease)   . H/O hiatal hernia   . Headache(784.0)   . Heart murmur   . Hypertension   . Lumbar spinal stenosis   . Lumbar spondylolysis   . MS (multiple sclerosis) ([place])   . Neuromuscular disorder ([place])    MS  . Urinary frequency   . Wears glasses     Past Surgical History:  Procedure Laterality Date  . CHOLECYSTECTOMY    . EYE SURGERY     BLEPHAROPLASTY  . LUMBAR FUSION  05/10/2015  . POSTERIOR FUSION LUMBAR SPINE  04/16/11   L4-5  . TRANSFORAMINAL LUMBAR INTERBODY FUSION (TLIF) WITH PEDICLE SCREW FIXATION 1 LEVEL N/A 05/10/2015   Procedure: TRANSFORAMINAL LUMBAR INTERBODY FUSION (TLIF) WITH PEDICLE SCREW FIXATION L3-4;  Surgeon: Eustace Moore, MD;  Location: [facility];  Service: Neurosurgery;  Laterality: N/A;  TRANSFORAMINAL LUMBAR INTERBODY FUSION (TLIF) WITH PEDICLE SCREW FIXATION L3-4  . UTERINE FIBROID EMBOLIZATION      Current Medications: Current Meds  Medication Sig  . acetaminophen (TYLENOL) 500 MG tablet Take 500 mg by mouth every 6 (six) hours as needed.  Marland Kitchen amLODipine (NORVASC) 10 MG tablet Take 10 mg by mouth daily.  Marland Kitchen atorvastatin (LIPITOR) 20 MG tablet Take 20 mg by mouth daily.  . baclofen (LIORESAL) 10 MG tablet Take 1 tablet (10 mg total) by mouth 3 (three) times daily. Take 2 tablets by mouth  at bedtime  . benzonatate (TESSALON) 200 MG capsule Take 200 mg by mouth 3 (three) times daily as needed.  . busPIRone (BUSPAR) 15 MG tablet TAKE 1 TABLET BY MOUTH TWICE DAILY  . esomeprazole (NEXIUM) 40 MG capsule Take 40 mg by mouth daily.  . famotidine (PEPCID) 20 MG tablet Take 20 mg by mouth at bedtime.  . fluticasone (FLONASE) 50 MCG/ACT nasal spray Place 2 sprays into the nose as needed. For stuffy nose.  . gabapentin (NEURONTIN) 100 MG capsule Take 1-2 capsules by mouth at bedtime  . hydrochlorothiazide (HYDRODIURIL) 25 MG tablet Take 1 tablet by mouth daily.  Marland Kitchen ibuprofen (ADVIL,MOTRIN) 600 MG tablet Take 1 tablet by mouth every 8 (eight) hours as needed for  mild pain or moderate pain.   . Loperamide HCl (ANTI-DIARRHEAL PO) Take 2 mg by mouth as needed.  Marland Kitchen losartan (COZAAR) 100 MG tablet Take 1 tablet by mouth daily.  Marland Kitchen losartan-hydrochlorothiazide (HYZAAR) 100-25 MG tablet Take 1 tablet by mouth daily.  . meclizine (ANTIVERT) 25 MG tablet Take 25 mg by mouth as needed for dizziness.  . metoprolol succinate (TOPROL-XL) 25 MG 24 hr tablet Take 1 tablet (25 mg total) by mouth daily. Take with or immediately following a meal.  . montelukast (SINGULAIR) 10 MG tablet Take 10 mg by mouth at bedtime.  . mupirocin ointment (BACTROBAN) 2 % mupirocin 2 % topical ointment  APPLY A SMALL AMOUNT TO THE AFFECTED AREA BY TOPICAL ROUTE 3 TIMES PER DAY  . nystatin (MYCOSTATIN/NYSTOP) powder Apply topically 4 (four) times daily.  Marland Kitchen nystatin cream (MYCOSTATIN) Apply 1 application topically 2 (two) times daily.  . ondansetron (ZOFRAN) 4 MG tablet Take 4-8 mg by mouth every 6 (six) hours as needed for nausea or vomiting.  . potassium chloride (K-DUR) 10 MEQ tablet Take 10 mEq by mouth daily.  . RESTASIS 0.05 % ophthalmic emulsion 1 drop 2 (two) times daily.  Marland Kitchen rOPINIRole (REQUIP) 0.5 MG tablet Take 1 tablet in the morning and 1 tablet at bedtime  . sertraline (ZOLOFT) 100 MG tablet Take 1.5 tablets (150 mg total) by mouth daily.  . solifenacin (VESICARE) 10 MG tablet Take 1 tablet (10 mg total) by mouth daily.  . SUDOGEST 30 MG tablet Take 30 mg by mouth.  . traMADol (ULTRAM) 50 MG tablet Take 1 tablet (50 mg total) by mouth daily.  . traZODone (DESYREL) 100 MG tablet Take 100 mg by mouth at bedtime.     Allergies:   Other and Myrbetriq [mirabegron]   Social History   Socioeconomic History  . Marital status: Single    Spouse name: Not on file  . Number of children: 0  . Years of education: 16  . Highest education level: Not on file  Occupational History    Comment: Does not work - Disabled  Tobacco Use  . Smoking status: Former Smoker    Packs/day: 2.00     Quit date: 04/08/1996    Years since quitting: 24.3  . Smokeless tobacco: Never Used  . Tobacco comment: ALCOHOL IN PAST  Substance and Sexual Activity  . Alcohol use: No  . Drug use: No  . Sexual activity: Yes    Birth control/protection: Post-menopausal  Other Topics Concern  . Not on file  Social History Narrative   Patient does not work she is disabled. and lives at home. Patient is single.   Caffeine: 1-2 cups daily depending on the day   Right handed.   Social Determinants of Health  Financial Resource Strain: Not on file  Food Insecurity: Not on file  Transportation Needs: Not on file  Physical Activity: Not on file  Stress: Not on file  Social Connections: Not on file     Family History: The patient's family history includes Emphysema in her father; Heart disease in an other family member; Lung cancer in her mother.  ROS:   Please see the history of present illness.     All other systems reviewed and are negative.  EKGs/Labs/Other Studies Reviewed:    The following studies were reviewed today: Notes from Dr. Tamala Julian.  Recent labs  EKG:  EKG is ordered today.  The ekg ordered today demonstrates sinus rhythm, normal tracing  Recent Labs: No results found for requested labs within last 8760 hours.  Recent Lipid Panel No results found for: CHOL, TRIG, HDL, CHOLHDL, VLDL, LDLCALC, LDLDIRECT  05/23/2020 TSH 4.31, hemoglobin 11.8, glucose 93, potassium 4.9, creatinine 0.68 12/06/2019 cholesterol 198, HDL 67, LDL 100, triglycerides 179  Risk Assessment/Calculations:       Physical Exam:    VS:  BP 138/76 (BP Location: Left Arm, Patient Position: Sitting, Cuff Size: Normal)   Pulse 64   Ht 5\' 1"  (1.549 m)   Wt 139 lb (63 kg)   BMI 26.26 kg/m     Wt Readings from Last 3 Encounters:  08/17/20 139 lb (63 kg)  06/20/20 141 lb 8 oz (64.2 kg)  01/11/20 150 lb 8 oz (68.3 kg)     GEN: Appears anxious, but relaxed during the interview; well nourished, well  developed in no acute distress HEENT: Normal NECK: No JVD; No carotid bruits LYMPHATICS: No lymphadenopathy CARDIAC: RRR, no murmurs, rubs.  S4 gallop is present RESPIRATORY:  Clear to auscultation without rales, wheezing or rhonchi  ABDOMEN: Soft, non-tender, non-distended MUSCULOSKELETAL:  No edema; No deformity  SKIN: Warm and dry NEUROLOGIC:  Alert and oriented x 3 PSYCHIATRIC:  Normal affect   ASSESSMENT:    1. PVCs (premature ventricular contractions)   2. Abnormal heart sounds   3. Essential hypertension    PLAN:    In order of problems listed above:  1. PVCs: These do not appear to associate any symptoms other than palpitations.  Quite possible they are benign.  Abrol succinate 25 mg once daily.  We will check an echocardiogram for structural heart problems.  I suspect that "bradycardia" was due to ventricular bigeminy with under counting of PVCs in the peripheral pulse. 2. S4: Suggest that she has left ventricular hypertrophy.  Evaluate echocardiogram.  She does not have any signs or symptoms of heart failure. 3. HTN: Borderline control.  Add metoprolol.  If blood pressure becomes too low would reduce the dose of amlodipine.        Medication Adjustments/Labs and Tests Ordered: Current medicines are reviewed at length with the patient today.  Concerns regarding medicines are outlined above.  Orders Placed This Encounter  Procedures  . EKG 12-Lead  . ECHOCARDIOGRAM COMPLETE   Meds ordered this encounter  Medications  . metoprolol succinate (TOPROL-XL) 25 MG 24 hr tablet    Sig: Take 1 tablet (25 mg total) by mouth daily. Take with or immediately following a meal.    Dispense:  90 tablet    Refill:  1    Patient Instructions  Medication Instructions:  START Metoprolol Succinate 25 mg once daily  *If you need a refill on your cardiac medications before your next appointment, please call your pharmacy*   Lab Work:  None ordered If you have labs (blood work)  drawn today and your tests are completely normal, you will receive your results only by: Marland Kitchen MyChart Message (if you have MyChart) OR . A paper copy in the mail If you have any lab test that is abnormal or we need to change your treatment, we will call you to review the results.   Testing/Procedures: Your physician has requested that you have an echocardiogram. Echocardiography is a painless test that uses sound waves to create images of your heart. It provides your doctor with information about the size and shape of your heart and how well your heart's chambers and valves are working. You may receive an ultrasound enhancing agent through an IV if needed to better visualize your heart during the echo.This procedure takes approximately one hour. There are no restrictions for this procedure. This will take place at the [address].     Follow-Up: At [facility], you and your health needs are our priority.  As part of our continuing mission to provide you with exceptional heart care, we have created designated Provider Care Teams.  These Care Teams include your primary Cardiologist (physician) and Advanced Practice Providers (APPs -  Physician Assistants and Nurse Practitioners) who all work together to provide you with the care you need, when you need it.  We recommend signing up for the patient portal called "MyChart".  Sign up information is provided on this After Visit Summary.  MyChart is used to connect with patients for Virtual Visits (Telemedicine).  Patients are able to view lab/test results, encounter notes, upcoming appointments, etc.  Non-urgent messages can be sent to your provider as well.   To learn more about what you can do with MyChart, go to [email or web address].    Your next appointment:   3 month(s)  The format for your next appointment:   In Person  Provider:   You may see Sanda Klein, MD or one of the following Advanced Practice Providers on your  designated Care Team:    Almyra Deforest, PA-C  Fabian Sharp, [place] or   Roby Lofts, PA-C      Signed, Sanda Klein, MD  08/17/2020 7:29 PM    [place]

## 2020-08-22 DIAGNOSIS — R531 Weakness: Secondary | ICD-10-CM | POA: Diagnosis not present

## 2020-08-22 DIAGNOSIS — G35 Multiple sclerosis: Secondary | ICD-10-CM | POA: Diagnosis not present

## 2020-08-22 DIAGNOSIS — R2681 Unsteadiness on feet: Secondary | ICD-10-CM | POA: Diagnosis not present

## 2020-08-22 DIAGNOSIS — M25552 Pain in left hip: Secondary | ICD-10-CM | POA: Diagnosis not present

## 2020-08-22 DIAGNOSIS — R350 Frequency of micturition: Secondary | ICD-10-CM | POA: Diagnosis not present

## 2020-08-22 DIAGNOSIS — G2581 Restless legs syndrome: Secondary | ICD-10-CM | POA: Diagnosis not present

## 2020-08-28 NOTE — Telephone Encounter (Signed)
Patient is calling about her toe spacers w/ loop. She would like another set mailed to her. I didn't know exactly which ones she wanted. Also I did let her know that she would need to send a check for the ones mailed. She also would like Dr. Jacqualyn Posey to call her when he gets the chance.

## 2020-09-06 ENCOUNTER — Telehealth: Payer: Self-pay | Admitting: Cardiovascular Disease

## 2020-09-06 NOTE — Telephone Encounter (Signed)
   Pt c/o swelling: STAT is pt has developed SOB within 24 hours  If swelling, where is the swelling located? Both legs  How much weight have you gained and in what time span? Since after her visit with Dr. Loletha Grayer 05/27  Have you gained 3 pounds in a day or 5 pounds in a week?   Do you have a log of your daily weights (if so, list)?   Are you currently taking a fluid pill? yes  Are you currently SOB? No  Have you traveled recently? No   Pt said since her visit with Dr. Loletha Grayer her legs has been swelling, she said it get worst when she walks. She said she is not gaining weight in fact she is losing weight. She wanted to know what she needs to do with the swelling.

## 2020-09-06 NOTE — Telephone Encounter (Signed)
Spoke to patient she stated she has noticed more swelling in both ankles the past 3 to 4 days.No sob.Stated she has lost 60 lbs within the past 1 year.She did not try and lose weight.Advised she needs to see PCP about weight loss.Advised Dr.Croitoru is out of office this week.Appointment scheduled with Laurann Montana NP 7/7 at 1:30 pm.I will make Dr.Croitoru aware.

## 2020-09-07 ENCOUNTER — Telehealth: Payer: Self-pay | Admitting: Cardiovascular Disease

## 2020-09-07 NOTE — Telephone Encounter (Signed)
Routed to MD to make aware 

## 2020-09-07 NOTE — Telephone Encounter (Signed)
Pt called to inform Dr. Loletha Grayer that she went to have her thyroid checked via [facility], everything came back normal

## 2020-09-10 ENCOUNTER — Telehealth: Payer: Self-pay | Admitting: Neurology

## 2020-09-10 DIAGNOSIS — F418 Other specified anxiety disorders: Secondary | ICD-10-CM

## 2020-09-10 DIAGNOSIS — R269 Unspecified abnormalities of gait and mobility: Secondary | ICD-10-CM

## 2020-09-10 DIAGNOSIS — G35 Multiple sclerosis: Secondary | ICD-10-CM

## 2020-09-10 DIAGNOSIS — R5383 Other fatigue: Secondary | ICD-10-CM

## 2020-09-10 DIAGNOSIS — R531 Weakness: Secondary | ICD-10-CM

## 2020-09-10 NOTE — Addendum Note (Signed)
Addended by: Wyvonnia Lora on: 09/10/2020 02:26 PM   Modules accepted: Orders

## 2020-09-10 NOTE — Telephone Encounter (Signed)
Called pt back. She feels like she needs more PT. Feeling unsteady/weak. Denies any falls. Was previously using [facility]. Advised Dr. Felecia Shelling ok with PT order, he will be back tomorrow and can review/sign off on it tomorrow. She verbalized understanding.

## 2020-09-10 NOTE — Telephone Encounter (Signed)
Pt would like to know what Dr Felecia Shelling thinks about more Physical Therapy for her, please call.

## 2020-09-14 ENCOUNTER — Other Ambulatory Visit: Payer: Self-pay

## 2020-09-14 ENCOUNTER — Ambulatory Visit (HOSPITAL_COMMUNITY): Payer: Medicare Other | Attending: Cardiovascular Disease

## 2020-09-14 DIAGNOSIS — R012 Other cardiac sounds: Secondary | ICD-10-CM

## 2020-09-14 LAB — ECHOCARDIOGRAM COMPLETE
Area-P 1/2: 3.53 cm2
P 1/2 time: 508 msec
S' Lateral: 3 cm

## 2020-09-17 NOTE — Telephone Encounter (Signed)
Sent to [facility]. P: [phone].

## 2020-09-18 ENCOUNTER — Telehealth: Payer: Self-pay | Admitting: Neurology

## 2020-09-18 DIAGNOSIS — R5383 Other fatigue: Secondary | ICD-10-CM | POA: Diagnosis not present

## 2020-09-18 DIAGNOSIS — R269 Unspecified abnormalities of gait and mobility: Secondary | ICD-10-CM | POA: Diagnosis not present

## 2020-09-18 DIAGNOSIS — G35 Multiple sclerosis: Secondary | ICD-10-CM | POA: Diagnosis not present

## 2020-09-18 NOTE — Telephone Encounter (Signed)
[facility]) called, need verbal order for Physical Therapy.  Frequency: 1x a wk for 5 wks.

## 2020-09-18 NOTE — Telephone Encounter (Signed)
Called and spoke w/ [place]. Provided VO as requested.

## 2020-09-25 ENCOUNTER — Telehealth: Payer: Self-pay | Admitting: Neurology

## 2020-09-25 ENCOUNTER — Other Ambulatory Visit: Payer: Self-pay | Admitting: Family Medicine

## 2020-09-25 DIAGNOSIS — R350 Frequency of micturition: Secondary | ICD-10-CM

## 2020-09-25 DIAGNOSIS — R6889 Other general symptoms and signs: Secondary | ICD-10-CM

## 2020-09-25 DIAGNOSIS — R3915 Urgency of urination: Secondary | ICD-10-CM

## 2020-09-25 MED ORDER — GEMTESA 75 MG PO TABS: 1.0000 | ORAL_TABLET | Freq: Every day | ORAL | 5 refills | Status: DC

## 2020-09-25 MED ORDER — MIRABEGRON ER 50 MG PO TB24: 50.0000 mg | ORAL_TABLET | Freq: Every day | ORAL | 1 refills | Status: DC

## 2020-09-25 NOTE — Telephone Encounter (Signed)
Pt called, related to my MS; urinating a lot, not able to sleep at night. What can be done? Would like a call from the nurse.

## 2020-09-25 NOTE — Telephone Encounter (Signed)
Called the patient back and she is currently taking vesicare 10 mg. Dr Felecia Shelling states that we can call in a medication called Myrbetriq 50 mg daily. Advised the patient its fine for her to take both of the medications. Pt verbalized understanding. Advised the med will be called into [facility]

## 2020-09-25 NOTE — Telephone Encounter (Addendum)
Spoke w/ MD. Edyth Gunnels caused nose to dry out/nose bleeds in the past for pt. He recommends Gemtesa 75 mg po daily instead. It is a similar medication. Called [facility] and cx rx myrbetriq sent. Advised we are sending alternative. I called pt back. She is agreeable to new plan. I e-scribed Gemtesa.

## 2020-09-25 NOTE — Addendum Note (Signed)
Addended by: Wyvonnia Lora on: 09/25/2020 03:18 PM   Modules accepted: Orders

## 2020-09-26 ENCOUNTER — Telehealth: Payer: Self-pay | Admitting: Neurology

## 2020-09-26 ENCOUNTER — Telehealth: Payer: Self-pay | Admitting: *Deleted

## 2020-09-26 NOTE — Telephone Encounter (Signed)
LVM: Pt having some problem with swallowing. Would like a call from the nurse.

## 2020-09-26 NOTE — Telephone Encounter (Signed)
Request Reference Number: [id number]. GEMTESA TAB 75MG  is approved through 03/23/2021. Your patient may now fill this prescription and it will be covered.

## 2020-09-26 NOTE — Telephone Encounter (Signed)
Submitted PA Gemtesa on CMM. Key: [id number]. Waiting on determination from [employer].

## 2020-09-26 NOTE — Telephone Encounter (Signed)
Called pt back. Swallowing issue started today in the morning. Noticed it when she was in the bathroom earlier. Unable to tell me time frame. States it has resolved. She will continue to monitor and let us know if sx return or worsen. She has not gotten [place] yet. She will follow up with pharmacy tomorrow on this.

## 2020-09-27 ENCOUNTER — Telehealth: Payer: Self-pay | Admitting: Neurology

## 2020-09-27 ENCOUNTER — Ambulatory Visit (HOSPITAL_BASED_OUTPATIENT_CLINIC_OR_DEPARTMENT_OTHER): Payer: Medicare Other | Admitting: Family

## 2020-09-27 NOTE — Telephone Encounter (Signed)
Called pt back. States swallowing issue has returned. Hard to eat crackers/toast w/o water/ginger ale. She normally does not have this issue. Concerned because she has read some pt w/ MS can have swallowing issues. Her speech is clear. She denies any signs/sx of sickness. Advised I will discuss w/ MD and call her back.

## 2020-09-27 NOTE — Telephone Encounter (Signed)
Called back. Advised [name] was called in and PA approved via insurance. Pt verbalized understanding and appreciation.

## 2020-09-27 NOTE — Telephone Encounter (Signed)
Pt called, problem with swallowing has returned. Would like a call from the nurse.

## 2020-09-27 NOTE — Telephone Encounter (Signed)
Called pt back. Advised per Dr. Felecia Shelling that because her speech is clear, he is not to concerned about swallowing issue. Pt feels she may being having increased anxiety which may be aiding in issue. She will increase fluid intake and eat a softer diet for the next week. If swallowing issues continue, she will call back next week. Aware Dr. Felecia Shelling will order swallow test at that time to further evaluate.

## 2020-09-27 NOTE — Telephone Encounter (Signed)
Pt called wanting to know if a new prescription had been called in to her pharmacy, pt did not know the name of the prescription. Pt requesting a call back.

## 2020-10-02 ENCOUNTER — Telehealth: Payer: Self-pay | Admitting: Neurology

## 2020-10-02 NOTE — Telephone Encounter (Signed)
@   9:04 pt left a vm asking for a call back with the name of her RLS medication, please call

## 2020-10-02 NOTE — Telephone Encounter (Signed)
Called and spoke w/ pt. Advised RLS med is ropinirole. Pt verbalized understanding.

## 2020-10-02 NOTE — Telephone Encounter (Signed)
Spoke to patient-aware of recommendations.  She states she does not want to take medication if she doesn't need it.   She states she was having symptoms prior to starting the metoprolol and no longer has those on the medication.   She is wondering if she needs this.   Advised if helping with symptoms, would recommend continuing this.  Patient states she would like a "definite" answer from Dr. Sallyanne Kuster.     Routed to MD to review,

## 2020-10-03 MED ORDER — METOPROLOL SUCCINATE ER 25 MG PO TB24
25.0000 mg | ORAL_TABLET | Freq: Every day | ORAL | 1 refills | Status: DC | PRN
Start: 2020-10-03 — End: 2020-12-14

## 2020-10-03 NOTE — Telephone Encounter (Signed)
Pt no longer needs a call back from [place], RN she located her medication.

## 2020-10-03 NOTE — Telephone Encounter (Signed)
Patient has been made aware and verbalized her understanding.  

## 2020-10-03 NOTE — Patient Outreach (Signed)
Received a pharmacy referral from London Sheer at [facility].  I have sent that referral to Ernst Bowler who is a Clinical research associate for [employer].

## 2020-10-03 NOTE — Telephone Encounter (Signed)
Pt called again wanting to speak to the RN regarding her RLS medication. Please advise.

## 2020-10-04 DIAGNOSIS — R269 Unspecified abnormalities of gait and mobility: Secondary | ICD-10-CM | POA: Diagnosis not present

## 2020-10-04 DIAGNOSIS — R5383 Other fatigue: Secondary | ICD-10-CM | POA: Diagnosis not present

## 2020-10-04 DIAGNOSIS — G35 Multiple sclerosis: Secondary | ICD-10-CM | POA: Diagnosis not present

## 2020-10-05 ENCOUNTER — Ambulatory Visit (HOSPITAL_BASED_OUTPATIENT_CLINIC_OR_DEPARTMENT_OTHER): Payer: Medicare Other | Admitting: Family

## 2020-10-08 ENCOUNTER — Telehealth: Payer: Self-pay | Admitting: Neurology

## 2020-10-08 NOTE — Telephone Encounter (Signed)
Pt. left vm stating that she would like to speak to the RN regarding some questions she has on her medications and that Sadie Haber will be calling as well to discuss her medications.

## 2020-10-08 NOTE — Telephone Encounter (Signed)
Pt has called back to clarify that it is the rOPINIRole (REQUIP) 0.5 MG tablet that she has questions and concerns about, please call

## 2020-10-08 NOTE — Telephone Encounter (Signed)
Pt called, have a question about  busPIRone (BUSPAR) 15 MG tablet. Would like a call from the nurse.

## 2020-10-08 NOTE — Telephone Encounter (Signed)
Called the pt back and she had questions about the dose of the requip. She is currently taking 1 in am and 1 in pm. I asked if she is having break through discomfort and she states for the most part she is ok until she is due for the evening dose. Informed that I recommend in the meantime she continue at current dose and if she feels it is beginning to wear off before the evening dose is due, let us know at that time.

## 2020-10-09 NOTE — Telephone Encounter (Signed)
Called the pt back, pt was calling back restating yesterdays call with Casey,RN. Restated message per Casey's last conversation with pt. No further action needed.

## 2020-10-10 ENCOUNTER — Telehealth: Payer: Self-pay | Admitting: Neurology

## 2020-10-10 DIAGNOSIS — R5383 Other fatigue: Secondary | ICD-10-CM | POA: Diagnosis not present

## 2020-10-10 DIAGNOSIS — R269 Unspecified abnormalities of gait and mobility: Secondary | ICD-10-CM | POA: Diagnosis not present

## 2020-10-10 DIAGNOSIS — G35 Multiple sclerosis: Secondary | ICD-10-CM

## 2020-10-10 DIAGNOSIS — R531 Weakness: Secondary | ICD-10-CM

## 2020-10-10 NOTE — Telephone Encounter (Signed)
[name]) called, request verbal order for physical therapy. Frequency: 2x for 4 wks, 1x for 1 wk.

## 2020-10-10 NOTE — Telephone Encounter (Signed)
Called and LVM for [place] providing VO for PT as requested.

## 2020-10-15 MED ORDER — ROPINIROLE HCL 0.5 MG PO TABS: ORAL_TABLET | ORAL | 5 refills | Status: DC

## 2020-10-15 NOTE — Telephone Encounter (Signed)
Pt is asking for a call from Terrence Dupont, RN to discuss her rOPINIRole (REQUIP) 0.5 MG tablet that

## 2020-10-15 NOTE — Addendum Note (Signed)
Addended by: Wyvonnia Lora on: 10/15/2020 09:21 AM   Modules accepted: Orders

## 2020-10-15 NOTE — Telephone Encounter (Signed)
Called pt back. She is going to be changing pharmacies from [facility] to [facility]. Asked rx requip be sent to [facility]. I e-scribed rx as requested.

## 2020-10-17 DIAGNOSIS — R269 Unspecified abnormalities of gait and mobility: Secondary | ICD-10-CM | POA: Diagnosis not present

## 2020-10-17 DIAGNOSIS — R5383 Other fatigue: Secondary | ICD-10-CM | POA: Diagnosis not present

## 2020-10-17 DIAGNOSIS — G35 Multiple sclerosis: Secondary | ICD-10-CM | POA: Diagnosis not present

## 2020-10-19 ENCOUNTER — Ambulatory Visit
Admission: RE | Admit: 2020-10-19 | Discharge: 2020-10-19 | Disposition: A | Discharge status: Home / Self Care | Payer: Medicare Other | Source: Ambulatory Visit | Attending: Family Medicine | Admitting: Family Medicine

## 2020-10-19 ENCOUNTER — Other Ambulatory Visit: Payer: Self-pay

## 2020-10-19 DIAGNOSIS — R6889 Other general symptoms and signs: Secondary | ICD-10-CM

## 2020-10-19 DIAGNOSIS — E7800 Pure hypercholesterolemia, unspecified: Secondary | ICD-10-CM | POA: Diagnosis not present

## 2020-10-19 DIAGNOSIS — I998 Other disorder of circulatory system: Secondary | ICD-10-CM | POA: Diagnosis not present

## 2020-10-19 DIAGNOSIS — I10 Essential (primary) hypertension: Secondary | ICD-10-CM | POA: Diagnosis not present

## 2020-10-19 DIAGNOSIS — K219 Gastro-esophageal reflux disease without esophagitis: Secondary | ICD-10-CM | POA: Diagnosis not present

## 2020-10-19 DIAGNOSIS — G4700 Insomnia, unspecified: Secondary | ICD-10-CM | POA: Diagnosis not present

## 2020-10-22 DIAGNOSIS — I493 Ventricular premature depolarization: Secondary | ICD-10-CM | POA: Diagnosis not present

## 2020-10-22 DIAGNOSIS — R269 Unspecified abnormalities of gait and mobility: Secondary | ICD-10-CM | POA: Diagnosis not present

## 2020-10-22 DIAGNOSIS — I10 Essential (primary) hypertension: Secondary | ICD-10-CM | POA: Diagnosis not present

## 2020-10-22 DIAGNOSIS — G35 Multiple sclerosis: Secondary | ICD-10-CM | POA: Diagnosis not present

## 2020-10-22 DIAGNOSIS — K219 Gastro-esophageal reflux disease without esophagitis: Secondary | ICD-10-CM | POA: Diagnosis not present

## 2020-10-22 DIAGNOSIS — R7303 Prediabetes: Secondary | ICD-10-CM | POA: Diagnosis not present

## 2020-10-22 DIAGNOSIS — R5383 Other fatigue: Secondary | ICD-10-CM | POA: Diagnosis not present

## 2020-10-25 DIAGNOSIS — G35 Multiple sclerosis: Secondary | ICD-10-CM | POA: Diagnosis not present

## 2020-10-25 DIAGNOSIS — R5383 Other fatigue: Secondary | ICD-10-CM | POA: Diagnosis not present

## 2020-10-25 DIAGNOSIS — R269 Unspecified abnormalities of gait and mobility: Secondary | ICD-10-CM | POA: Diagnosis not present

## 2020-10-29 ENCOUNTER — Other Ambulatory Visit: Payer: Self-pay | Admitting: Family Medicine

## 2020-10-29 DIAGNOSIS — R5383 Other fatigue: Secondary | ICD-10-CM | POA: Diagnosis not present

## 2020-10-29 DIAGNOSIS — G35 Multiple sclerosis: Secondary | ICD-10-CM | POA: Diagnosis not present

## 2020-10-29 DIAGNOSIS — R269 Unspecified abnormalities of gait and mobility: Secondary | ICD-10-CM | POA: Diagnosis not present

## 2020-10-29 DIAGNOSIS — Z1231 Encounter for screening mammogram for malignant neoplasm of breast: Secondary | ICD-10-CM

## 2020-10-31 ENCOUNTER — Telehealth: Payer: Self-pay | Admitting: Neurology

## 2020-10-31 DIAGNOSIS — R269 Unspecified abnormalities of gait and mobility: Secondary | ICD-10-CM | POA: Diagnosis not present

## 2020-10-31 DIAGNOSIS — G35 Multiple sclerosis: Secondary | ICD-10-CM | POA: Diagnosis not present

## 2020-10-31 DIAGNOSIS — R5383 Other fatigue: Secondary | ICD-10-CM | POA: Diagnosis not present

## 2020-10-31 MED ORDER — BACLOFEN 10 MG PO TABS: 10.0000 mg | ORAL_TABLET | Freq: Three times a day (TID) | ORAL | 11 refills | Status: DC

## 2020-10-31 NOTE — Telephone Encounter (Signed)
LVM for [name] advising Dr. Felecia Shelling would like her to take Baclofen '10mg'$  po TID. E-scribed updated rx to [facility]. Asked [name] to call back if she has any further questions.

## 2020-10-31 NOTE — Telephone Encounter (Signed)
Called pt back. She is taking requip BID as prescribed. She has not taken Baclofen '10mg'$ . She will try taking this for sx. She will call back if ineffective.

## 2020-10-31 NOTE — Telephone Encounter (Signed)
Pt called saying she is having really bad leg spasms in the right leg more so than left. Pt says the rOPINIRole (REQUIP) 0.5 MG tablet is not really helping at all. Pt requesting a call back.

## 2020-10-31 NOTE — Telephone Encounter (Signed)
Called pt back. Advised she can take it up to three times a day. She verbalized understanding. She now uses [facility] and states they may reach out to verify she is taking this. Nothing further needed.

## 2020-10-31 NOTE — Telephone Encounter (Signed)
Called [name] back. They have some conflicts on how pt is taking med. PCP has documented that she takes requip 0.'5mg'$  1 in the am, 2 in the pm. Advised it should be 1 po BID. She will correct this. She also reports patient only taking gabapentin, 1 capsule at night. I updated med list.  She is needing refill for Gemtesa sent to [facility]. Advised refill sent to [facility] 09/25/20 #30,5 refills. She will see if refills can be transferred to [facility].   For baclofen, advised rx should be '10mg'$  po TID. She had bottle at home that stated 1 at 12pm and 2 at bedtime. PCP concerned about sedation on med. Advised pt on this for spasticity related to MS. Wondering if we want to change directions to 1 po TID as written on rx rx'd by Dr. Felecia Shelling. Advised I will clarify with him and call her back.

## 2020-10-31 NOTE — Telephone Encounter (Signed)
Pt has called to report that she found her baclofen (LIORESAL) 10 MG tablet, she has taken 1 and wants to know how many she is to take a day

## 2020-10-31 NOTE — Addendum Note (Signed)
Addended by: Wyvonnia Lora on: 10/31/2020 04:12 PM   Modules accepted: Orders

## 2020-10-31 NOTE — Telephone Encounter (Signed)
[name] from [facility] called wanting to get on the same page on the directions of how pt should be taking the baclofen (LIORESAL) 10 MG tablet. [name] is requesting a call back.

## 2020-11-05 DIAGNOSIS — R5383 Other fatigue: Secondary | ICD-10-CM | POA: Diagnosis not present

## 2020-11-05 DIAGNOSIS — G35 Multiple sclerosis: Secondary | ICD-10-CM | POA: Diagnosis not present

## 2020-11-05 DIAGNOSIS — R269 Unspecified abnormalities of gait and mobility: Secondary | ICD-10-CM | POA: Diagnosis not present

## 2020-11-07 ENCOUNTER — Telehealth: Payer: Self-pay | Admitting: Neurology

## 2020-11-07 NOTE — Telephone Encounter (Signed)
Called pt back to discuss. She feels MS has worsened. Feels very weak. Not sleeping well. She is very anxious. Anxious living alone. Offered appt at 1pm today but she declined, has to set up [facility] transportation and has to give at least 24 hr notice. Says it is not urgent. Offered 12/03/20 at 1:30p w/ Dr. Felecia Shelling. She declined, would like to keep 12/26/20 appt for now. She is going to speak w/ family member to see if they can come on 12/03/20 with her. She will call back to let us know if she would like this sooner appt. It was placed on hold.

## 2020-11-07 NOTE — Addendum Note (Signed)
Addended by: Wyvonnia Lora on: 11/07/2020 11:23 AM   Modules accepted: Orders

## 2020-11-07 NOTE — Telephone Encounter (Signed)
Pt called she wants to speak with someone about her MS. She thinks it is taking a turn for the worse. Pt requesting a call back.

## 2020-11-07 NOTE — Telephone Encounter (Signed)
Called pt back. She just finished PT w/ [facility] last week. Would like to continued for awhile longer. Advised we will place new referral for her. She verbalized understanding and appreciation.

## 2020-11-07 NOTE — Telephone Encounter (Signed)
Pt has called asking if she can resume her physical therapy, she said she realizes now that she asked it to end too soon.  Pt would like the order to be sent back over to start PT back up again due to her legs feeling like noodles.

## 2020-11-08 ENCOUNTER — Ambulatory Visit: Payer: Medicare Other | Admitting: Podiatry

## 2020-11-08 NOTE — Telephone Encounter (Signed)
Pt called stating she has not been contacted about her PT. Pt is requesting a call back.

## 2020-11-08 NOTE — Telephone Encounter (Signed)
Called pt back. Reminded her referral just placed yesterday. Should give it some time for them to contact her. Confirmed she has their phone # and she can reach out to them to get update as well. She verbalized understanding.

## 2020-11-08 NOTE — Telephone Encounter (Signed)
Patient will call us back about appt 9/12 once she talks to her sister. She understands that she needs an office visit with [place] before she can start PT.

## 2020-11-08 NOTE — Telephone Encounter (Addendum)
Message from [facility] w/ [facility]:   We would be delighted to help [name] with home health PT.    Insurance requires [place] to have an MD / PA / NP note 90 days prior to the start of care (so around mid May) or 30 days after the start of care.  It can be done virtually via zoom or facetime, but it has to have a video component.    I'm calling the patient now. The PT in that zip code is full tomorrow so it might be Monday for a SOC. Meanwhile we need to figure out a way to get a visit scheduled with Dr. Felecia Shelling.

## 2020-11-08 NOTE — Telephone Encounter (Signed)
I have reached out to [facility] ([facility] about patient's referral. Waiting to hear back.

## 2020-11-08 NOTE — Telephone Encounter (Signed)
Pt has called back to provide the fax# to [facility] [phone].  Pt states she is falling everywhere and would like this to move along as quickly as possible.

## 2020-11-08 NOTE — Telephone Encounter (Signed)
Hey, do you mind calling pt to see if she can take 12/03/20 1:30p appt that I had already offered her and let her know she has to be seen first before getting scheduled for PT. Thank you

## 2020-11-08 NOTE — Telephone Encounter (Signed)
Lovena Le- can you please help with this? Thank you

## 2020-11-13 ENCOUNTER — Telehealth: Payer: Self-pay | Admitting: Neurology

## 2020-11-13 ENCOUNTER — Other Ambulatory Visit: Payer: Self-pay | Admitting: Neurology

## 2020-11-13 MED ORDER — SERTRALINE HCL 100 MG PO TABS: 150.0000 mg | ORAL_TABLET | Freq: Every day | ORAL | 3 refills | Status: DC

## 2020-11-13 MED ORDER — BUSPIRONE HCL 15 MG PO TABS: ORAL_TABLET | ORAL | 3 refills | Status: DC

## 2020-11-13 MED ORDER — GABAPENTIN 100 MG PO CAPS: ORAL_CAPSULE | ORAL | 1 refills | Status: DC

## 2020-11-13 NOTE — Telephone Encounter (Signed)
I have completed this request and sent the med refills for these 3 meds to [facility] for the pt

## 2020-11-13 NOTE — Telephone Encounter (Signed)
Dr. Felecia Shelling- how would you like to proceed with her at this point?

## 2020-11-13 NOTE — Telephone Encounter (Signed)
FYI from [facility] with [facility]:  We had just discharged her a few days before you sent the new referral.  I talked to her sister (RN in [place]) at length and to the patient a little bit.  But once our clinicians reviewed everything they said that nothing has changed since we discharged her from home health and that her psych issues were a barrier to progress.  She is seeing a counselor from her church but it is our team's belief that she would be better served by proper psychiatric intervention.

## 2020-11-13 NOTE — Telephone Encounter (Signed)
[name] from [facility] called requesting a pharmacy change for pt and to also request refills for busPIRone (BUSPAR) 15 MG tablet, gabapentin (NEURONTIN) 100 MG capsule, and sertraline (ZOLOFT) 100 MG tablet. New pharmacy [facility] [phone].

## 2020-11-13 NOTE — Telephone Encounter (Signed)
Refill already [facility] to [facility] 10/15/20 #60, 5 refills. Refills should be on file

## 2020-11-13 NOTE — Telephone Encounter (Signed)
Pt request refill rOPINIRole (REQUIP) 0.5 MG tablet at [facility]

## 2020-11-13 NOTE — Telephone Encounter (Signed)
Can you please follow up with pt to see if she wants to take 12/03/20 appt? Dr. Felecia Shelling can evaluate/further discuss treatment options with her then. Thank you

## 2020-11-14 NOTE — Telephone Encounter (Signed)
Took call from phone staff and spoke w/ pt. She wanted to know update on referral. Thought something went wrong. Advised they felt they did not have much more to offer her at this time. Dr. Felecia Shelling would like to see her in office for appt. Scheduled appt for 11/19/20 at 11am w/ Dr. Felecia Shelling. Advised her to check in by 1030am. She is going to call today to set up [facility] transportation to get to appt.

## 2020-11-19 ENCOUNTER — Ambulatory Visit: Payer: Medicare Other | Admitting: Neurology

## 2020-11-19 ENCOUNTER — Telehealth: Payer: Self-pay | Admitting: Neurology

## 2020-11-19 NOTE — Telephone Encounter (Signed)
Called the patient back and was able to schedule her for the original planned 9/12/ apt at 1:30 pm. Advised the pt to check in at 1 pm. Pt verbalized understanding.

## 2020-11-19 NOTE — Telephone Encounter (Signed)
Pt called stating she cancelled her appt for today however she wants to know should she reschedule that appt. Pt requesting a call back.

## 2020-12-03 ENCOUNTER — Encounter: Payer: Self-pay | Admitting: Neurology

## 2020-12-03 ENCOUNTER — Ambulatory Visit (INDEPENDENT_AMBULATORY_CARE_PROVIDER_SITE_OTHER): Payer: Self-pay | Admitting: Neurology

## 2020-12-03 ENCOUNTER — Other Ambulatory Visit: Payer: Self-pay | Admitting: Neurology

## 2020-12-03 VITALS — BP 137/82 | HR 67 | Ht 61.0 in | Wt 134.0 lb

## 2020-12-03 DIAGNOSIS — G35 Multiple sclerosis: Secondary | ICD-10-CM

## 2020-12-03 DIAGNOSIS — R269 Unspecified abnormalities of gait and mobility: Secondary | ICD-10-CM

## 2020-12-04 NOTE — Progress Notes (Signed)
The patient checked in for her visit but needed to leave before the appointment time

## 2020-12-10 ENCOUNTER — Telehealth: Payer: Self-pay | Admitting: Neurology

## 2020-12-10 DIAGNOSIS — Z23 Encounter for immunization: Secondary | ICD-10-CM | POA: Diagnosis not present

## 2020-12-10 NOTE — Telephone Encounter (Signed)
Pt called stating the rOPINIRole (REQUIP) 0.5 MG tablet is not helping with her restless leg and she is having trouble sleeping at night. Pt requesting a call back.

## 2020-12-11 NOTE — Telephone Encounter (Signed)
Called and spoke with patient. Offered work in appt today at [employer] . She declined, does not have transportation. Scheduled work in appt for tomorrow at 2pm, check in 130p. She will schedule [facility]

## 2020-12-12 ENCOUNTER — Other Ambulatory Visit: Payer: Self-pay

## 2020-12-12 ENCOUNTER — Encounter: Payer: Self-pay | Admitting: Neurology

## 2020-12-12 ENCOUNTER — Ambulatory Visit (INDEPENDENT_AMBULATORY_CARE_PROVIDER_SITE_OTHER): Payer: Medicare Other | Admitting: Neurology

## 2020-12-12 VITALS — BP 129/74 | HR 59 | Ht 61.0 in | Wt 132.5 lb

## 2020-12-12 DIAGNOSIS — M5432 Sciatica, left side: Secondary | ICD-10-CM

## 2020-12-12 DIAGNOSIS — R5383 Other fatigue: Secondary | ICD-10-CM

## 2020-12-12 DIAGNOSIS — F418 Other specified anxiety disorders: Secondary | ICD-10-CM

## 2020-12-12 DIAGNOSIS — R269 Unspecified abnormalities of gait and mobility: Secondary | ICD-10-CM

## 2020-12-12 DIAGNOSIS — R3915 Urgency of urination: Secondary | ICD-10-CM | POA: Diagnosis not present

## 2020-12-12 DIAGNOSIS — G35 Multiple sclerosis: Secondary | ICD-10-CM

## 2020-12-12 MED ORDER — ROPINIROLE HCL 0.5 MG PO TABS: ORAL_TABLET | ORAL | 11 refills | Status: DC

## 2020-12-12 NOTE — Progress Notes (Signed)
[facility]  PATIENT: [name] DOB: 07/19/1955  REFERRING DOCTOR OR PCP:  Dr. Leta Baptist  SOURCE: patient, MRI images on PACS, imaging reports, notes in EPIC and labs in EPIC  _________________________________   HISTORICAL  CHIEF COMPLAINT:  Chief Complaint  Patient presents with   Follow-up    Rm 2, alone. Here to discuss ongoing RLS/sleep issues.     HISTORY OF PRESENT ILLNESS:  [name] is a 65 y.o. woman with relapsing remitting multiple sclerosis diagnosed in 1983  Update 12/12/2020: She has been of Betaseron since 2021.    She has had no exacerbation or major change in symptoms.    She had an MRI early 2021 that shows extensive T2/FLAIR hyperintense foci in the hemispheres and a few foci in the pons and cerebellum in a pattern and configuration consistent with chronic demyelinating plaque associated with multiple sclerosis.  There could also be superimposed foci of chronic microvascular ischemic change.  She uses a walker mostly outside but nothing or a cane indoors.  .   Balance is also off.  She has had a fall when legs just gave out.        She has some spasticity and takes baclofen.   She has urinary frequency/urgency.   Solifenacin only helps a little bit.   There has RLS and takes gabapentin and ropinirole with some benefit.    She feels the ropinirole has not helped as much lately later in the day.   Due to insomnia she also takes trazodone at bedtime..   She has reduced short term memory and reduced attention and some word finding ererors.    She feels her memory is a little worse and she sometimes has trouble coming up with the right words.  Cognitive issues have slowly progress prior to discontinuing the Betaseron and all progressing at about the same rate.   She has depression and anxiety and is on combination of sertraline and buspar.    She has had some weight loss (10-15 pounds).  She is not sure why as appetite seems the same.   She had a  recent colonoscopy and had 9 polyps.     She notes left hip and buttock pain.  It is not as bad as before the piriformis injection but it was much better a few months after the TPI   MS History:    In 1983, while at work, she was having difficulty with her gait and was unsteady and falling. She R doctor and was referred to Dr. Morrell Riddle of [facility] neurology who diagnosed her with multiple sclerosis. She was in the Betaseron drug study (was on placebo) with Dr. Terie Purser in [place]. When Betaseron became available in 1992 or 1993 she started the medication and was seen Dr. Erling Cruz. Over the next few years she had a couple exacerbations that involved leg function, gait or balance. She had some treatments with IV steroids. She also has had some slow worsening with her gait more recently.   MRI Review:   MRI of the brain and cervical spine from 07/25/2013. In the brain, there are extensive T2/FLAIR hyperintense foci involving the cerebellum, left posterior medulla, there was a normal enhancement pattern. The cervical spine showed hyperintense foci adjacent to C3, C5-C6, C6, and T1. Right posterior pons, bilateral hemispheres, predominantly in the periventricular cortical white matter.   None of these enhanced.  MRI of the brain March 2020 showed Extensive T2/FLAIR hyperintense foci in the hemispheres and a few  foci in the pons and cerebellum in a pattern and configuration consistent with chronic demyelinating plaque associated with multiple sclerosis.  There could also be superimposed foci of chronic microvascular ischemic change.  The MRI is essentially unchanged compared to 2011    REVIEW OF SYSTEMS: Constitutional: No fevers, chills, sweats, or change in appetite.  Fatigue and sleepiness.  She has maintenance insomnia Eyes: No visual changes, double vision, eye pain.   Some fatigue. She has insomnia helped by trazodone. Ear, nose and throat: No hearing loss, ear pain, nasal congestion, sore  throat Cardiovascular: No chest pain, palpitations Respiratory:  No shortness of breath at rest or with exertion.   No wheezes.   She snores GastrointestinaI: No nausea, vomiting, diarrhea, abdominal pain, fecal incontinence Genitourinary:  as above Musculoskeletal:  No neck pain, back pain Integumentary: No rash, pruritus, skin lesions Neurological: as above Psychiatric: No depression at this time.  No anxiety Endocrine: No palpitations, diaphoresis, change in appetite, change in weigh or increased thirst Hematologic/Lymphatic:  No anemia, purpura, petechiae. Allergic/Immunologic: No itchy/runny eyes, nasal congestion, recent allergic reactions, rashes  ALLERGIES: Allergies  Allergen Reactions   Other Other (See Comments)    Pt states she took a medication 2-3 yrs ago that may have been for inflammation and it caused her to be weak.   Myrbetriq [Mirabegron]     Caused her to dry out/nose bleeds    HOME MEDICATIONS:  Current Outpatient Medications:    acetaminophen (TYLENOL) 500 MG tablet, Take 500 mg by mouth every 6 (six) hours as needed., Disp: , Rfl:    amLODipine (NORVASC) 10 MG tablet, Take 10 mg by mouth daily., Disp: , Rfl:    atorvastatin (LIPITOR) 20 MG tablet, Take 20 mg by mouth daily., Disp: , Rfl:    baclofen (LIORESAL) 10 MG tablet, Take 1 tablet (10 mg total) by mouth 3 (three) times daily., Disp: 90 tablet, Rfl: 11   benzonatate (TESSALON) 200 MG capsule, Take 200 mg by mouth 3 (three) times daily as needed., Disp: , Rfl:    busPIRone (BUSPAR) 15 MG tablet, TAKE 1 TABLET BY MOUTH TWICE DAILY, Disp: 180 tablet, Rfl: 3   esomeprazole (NEXIUM) 40 MG capsule, Take 40 mg by mouth daily., Disp: , Rfl:    famotidine (PEPCID) 20 MG tablet, Take 20 mg by mouth at bedtime., Disp: , Rfl:    fluticasone (FLONASE) 50 MCG/ACT nasal spray, Place 2 sprays into the nose as needed. For stuffy nose., Disp: , Rfl:    gabapentin (NEURONTIN) 100 MG capsule, Take 1-2 capsules by mouth at  bedtime, Disp: 180 capsule, Rfl: 1   hydrochlorothiazide (HYDRODIURIL) 25 MG tablet, Take 1 tablet by mouth daily., Disp: , Rfl:    ibuprofen (ADVIL,MOTRIN) 600 MG tablet, Take 1 tablet by mouth every 8 (eight) hours as needed for mild pain or moderate pain. , Disp: , Rfl:    Loperamide HCl (ANTI-DIARRHEAL PO), Take 2 mg by mouth as needed., Disp: , Rfl:    losartan (COZAAR) 100 MG tablet, Take 1 tablet by mouth daily., Disp: , Rfl:    losartan-hydrochlorothiazide (HYZAAR) 100-25 MG tablet, Take 1 tablet by mouth daily., Disp: , Rfl:    meclizine (ANTIVERT) 25 MG tablet, Take 25 mg by mouth as needed for dizziness., Disp: , Rfl:    metoprolol succinate (TOPROL-XL) 25 MG 24 hr tablet, Take 1 tablet (25 mg total) by mouth daily as needed. As needed for palpitations., Disp: 90 tablet, Rfl: 1   montelukast (SINGULAIR) 10  MG tablet, Take 10 mg by mouth at bedtime., Disp: , Rfl:    mupirocin ointment (BACTROBAN) 2 %, mupirocin 2 % topical ointment  APPLY A SMALL AMOUNT TO THE AFFECTED AREA BY TOPICAL ROUTE 3 TIMES PER DAY, Disp: , Rfl:    nystatin (MYCOSTATIN/NYSTOP) powder, Apply topically 4 (four) times daily., Disp: , Rfl:    nystatin cream (MYCOSTATIN), Apply 1 application topically 2 (two) times daily., Disp: , Rfl:    ondansetron (ZOFRAN) 4 MG tablet, Take 4-8 mg by mouth every 6 (six) hours as needed for nausea or vomiting., Disp: , Rfl:    potassium chloride (K-DUR) 10 MEQ tablet, Take 10 mEq by mouth daily., Disp: , Rfl:    RESTASIS 0.05 % ophthalmic emulsion, 1 drop 2 (two) times daily., Disp: , Rfl:    sertraline (ZOLOFT) 100 MG tablet, Take 1.5 tablets (150 mg total) by mouth daily., Disp: 135 tablet, Rfl: 3   solifenacin (VESICARE) 10 MG tablet, Take 1 tablet (10 mg total) by mouth daily., Disp: 90 tablet, Rfl: 0   SUDOGEST 30 MG tablet, Take 30 mg by mouth., Disp: , Rfl:    traMADol (ULTRAM) 50 MG tablet, Take 1 tablet (50 mg total) by mouth daily., Disp: 30 tablet, Rfl: 0   traZODone  (DESYREL) 100 MG tablet, Take 100 mg by mouth at bedtime., Disp: , Rfl:    Vibegron (GEMTESA) 75 MG TABS, Take 1 tablet by mouth daily., Disp: 30 tablet, Rfl: 5   rOPINIRole (REQUIP) 0.5 MG tablet, Take 1 tablet in the morning, 1 tablet in the evening  and 1 tablet at bedtime, Disp: 90 tablet, Rfl: 11  PAST MEDICAL HISTORY: Past Medical History:  Diagnosis Date   Anemia    HX   Anxiety    Arthritis    Chronic kidney disease    UTI'S   Complication of anesthesia    BP RISES WHEN AWAKENING   Complication of anesthesia    "difficult waking up and can become combative"    GERD (gastroesophageal reflux disease)    H/O hiatal hernia    Headache(784.0)    Heart murmur    Hypertension    Lumbar spinal stenosis    Lumbar spondylolysis    MS (multiple sclerosis) (HCC)    Neuromuscular disorder ([place])    MS   Urinary frequency    Wears glasses     PAST SURGICAL HISTORY: Past Surgical History:  Procedure Laterality Date   CHOLECYSTECTOMY     EYE SURGERY     BLEPHAROPLASTY   LUMBAR FUSION  05/10/2015   POSTERIOR FUSION LUMBAR SPINE  04/16/11   L4-5   TRANSFORAMINAL LUMBAR INTERBODY FUSION (TLIF) WITH PEDICLE SCREW FIXATION 1 LEVEL N/A 05/10/2015   Procedure: TRANSFORAMINAL LUMBAR INTERBODY FUSION (TLIF) WITH PEDICLE SCREW FIXATION L3-4;  Surgeon: Eustace Moore, MD;  Location: [facility];  Service: Neurosurgery;  Laterality: N/A;  TRANSFORAMINAL LUMBAR INTERBODY FUSION (TLIF) WITH PEDICLE SCREW FIXATION L3-4   UTERINE FIBROID EMBOLIZATION      FAMILY HISTORY: Family History  Problem Relation Age of Onset   Lung cancer Mother    Emphysema Father    Heart disease Other     SOCIAL HISTORY:  Social History   Socioeconomic History   Marital status: Single    Spouse name: Not on file   Number of children: 0   Years of education: 14   Highest education level: Not on file  Occupational History    Comment: Does not work -  Disabled  Tobacco Use   Smoking status: Former     Packs/day: 2.00    Types: Cigarettes    Quit date: 04/08/1996    Years since quitting: 24.6   Smokeless tobacco: Never   Tobacco comments:    ALCOHOL IN PAST  Substance and Sexual Activity   Alcohol use: No   Drug use: No   Sexual activity: Yes    Birth control/protection: Post-menopausal  Other Topics Concern   Not on file  Social History Narrative   Patient does not work she is disabled. and lives at home. Patient is single.   Caffeine: 1-2 cups daily depending on the day   Right handed.   Social Determinants of Health   Financial Resource Strain: Not on file  Food Insecurity: Not on file  Transportation Needs: Not on file  Physical Activity: Not on file  Stress: Not on file  Social Connections: Not on file  Intimate Partner Violence: Not on file     PHYSICAL EXAM  Vitals:   12/12/20 1306  BP: 129/74  Pulse: (!) 59  Weight: 132 lb 8 oz (60.1 kg)  Height: 5\' 1"  (1.549 m)    Body mass index is 25.04 kg/m.    General: The patient is well-developed and well-nourished and in no acute distress.  She is tender over the left piriformis muscle > trochanteric bursa.   Right side ok.    Skin: Extremities are without rash or edema.  Neurologic Exam  Mental status: The patient is alert and oriented x 3 at the time of the examination. The patient has apparent normal recent and remote memory, with an apparently normal attention span and concentration ability.   Speech is normal.  Cranial nerves: Extraocular movements are full.  Facial strength and sensation was normal.  Trapezius strength is normal.  No obvious hearing deficits are noted.  Motor:  Muscle bulk is normal.  Muscle tone is increased in the legs.  Strength was 5/5  Sensory: Normal touch and vibration sensation in limbs.    Coordination: She has good finger-nose-finger bilaterally.  Heel-to-shin is mildly reduced  Gait and station: Station is normal.  Gait is mildly wide.  She can walk without support, off  balanced turn.  She cannot do a tandem walk.   Romberg is borderline.   Reflexes: Deep tendon reflexes are symmetric and normal bilaterally.       ASSESSMENT AND PLAN  Multiple sclerosis (HCC)  Urinary urgency  Gait disturbance  Other fatigue  Depression with anxiety  1.   She will remain off of a disease modifying therapy for MS.  I will check an MRI of the brain and we will consider reinitiation of treatment if she has significant change. 2.   Left piriformis trigger point injection with 60 mg Depo-Medrol in 3 cc Marcaine.  She tolerated procedure well and there were no complications.   3.   Stay active.  Exercise as tolerated 4,   Requip for RLS. 5.   Possible UTI, check UA and Cx 6.    She will return in 6 months or sooner if there are new or worsening neurologic symptoms.  Channel Papandrea A. Felecia Shelling, MD, PhD 7/78/2423, 5:36 PM Certified in Neurology, Clinical Neurophysiology, Sleep Medicine, Pain Medicine and Neuroimaging  [facility] [address], [place], [place] [address] [phone]

## 2020-12-13 LAB — URINALYSIS, ROUTINE W REFLEX MICROSCOPIC
Bilirubin, UA: NEGATIVE
Glucose, UA: NEGATIVE
Ketones, UA: NEGATIVE
Nitrite, UA: NEGATIVE
Protein,UA: NEGATIVE
RBC, UA: NEGATIVE
Specific Gravity, UA: 1.023 (ref 1.005–1.030)
Urobilinogen, Ur: 0.2 mg/dL (ref 0.2–1.0)
pH, UA: 6.5 (ref 5.0–7.5)

## 2020-12-13 LAB — MICROSCOPIC EXAMINATION
Bacteria, UA: NONE SEEN
Casts: NONE SEEN /lpf
Epithelial Cells (non renal): NONE SEEN /hpf (ref 0–10)
RBC, Urine: NONE SEEN /hpf (ref 0–2)
WBC, UA: NONE SEEN /hpf (ref 0–5)

## 2020-12-14 ENCOUNTER — Telehealth: Payer: Self-pay | Admitting: Cardiovascular Disease

## 2020-12-14 DIAGNOSIS — K219 Gastro-esophageal reflux disease without esophagitis: Secondary | ICD-10-CM | POA: Diagnosis not present

## 2020-12-14 DIAGNOSIS — G4700 Insomnia, unspecified: Secondary | ICD-10-CM | POA: Diagnosis not present

## 2020-12-14 DIAGNOSIS — I10 Essential (primary) hypertension: Secondary | ICD-10-CM | POA: Diagnosis not present

## 2020-12-14 DIAGNOSIS — E7800 Pure hypercholesterolemia, unspecified: Secondary | ICD-10-CM | POA: Diagnosis not present

## 2020-12-14 MED ORDER — METOPROLOL SUCCINATE ER 25 MG PO TB24: 25.0000 mg | ORAL_TABLET | Freq: Every day | ORAL | 2 refills | Status: DC | PRN

## 2020-12-14 NOTE — Telephone Encounter (Signed)
*  STAT* If patient is at the pharmacy, call can be transferred to refill team.   1. Which medications need to be refilled? (please list name of each medication and dose if known)  metoprolol succinate (TOPROL-XL) 25 MG 24 hr tablet  2. Which pharmacy/location (including street and city if local pharmacy) is medication to be sent to? [facility] - [place], [place] - [address]  3. Do they need a 30 day or 90 day supply? 30 days   Patient gets pill packs from [facility]. This medication does not have any refills left. The next shipment is going out Thurs 12/20/20.  The patient is scheduled to see Dr. Loletha Grayer 12/21/20

## 2020-12-14 NOTE — Telephone Encounter (Signed)
Pt's medication was sent to pt's pharmacy as requested. Confirmation received.  °

## 2020-12-16 LAB — URINE CULTURE

## 2020-12-17 ENCOUNTER — Telehealth: Payer: Self-pay | Admitting: *Deleted

## 2020-12-17 DIAGNOSIS — M25552 Pain in left hip: Secondary | ICD-10-CM

## 2020-12-17 DIAGNOSIS — R269 Unspecified abnormalities of gait and mobility: Secondary | ICD-10-CM

## 2020-12-17 DIAGNOSIS — G35 Multiple sclerosis: Secondary | ICD-10-CM

## 2020-12-17 DIAGNOSIS — M5432 Sciatica, left side: Secondary | ICD-10-CM

## 2020-12-17 NOTE — Telephone Encounter (Signed)
Called and spoke w/ pt about results per Dr. Garth Bigness note. Pt verbalized understanding. Pt asked about PT referral. Advised she should hear something this week or next week to get scheduled.

## 2020-12-17 NOTE — Telephone Encounter (Signed)
-----   Message from Britt Bottom, MD sent at 12/16/2020  9:32 AM EDT ----- Please let her know the urine culture did not show any infection

## 2020-12-18 ENCOUNTER — Telehealth: Payer: Self-pay | Admitting: Cardiovascular Disease

## 2020-12-18 ENCOUNTER — Other Ambulatory Visit: Payer: Self-pay

## 2020-12-18 ENCOUNTER — Ambulatory Visit
Admission: RE | Admit: 2020-12-18 | Discharge: 2020-12-18 | Disposition: A | Discharge status: Home / Self Care | Payer: Medicare Other | Source: Ambulatory Visit | Attending: Family Medicine | Admitting: Family Medicine

## 2020-12-18 DIAGNOSIS — Z1231 Encounter for screening mammogram for malignant neoplasm of breast: Secondary | ICD-10-CM | POA: Diagnosis not present

## 2020-12-18 NOTE — Telephone Encounter (Signed)
   Pt c/o medication issue:  1. Name of Medication: metoprolol succinate (TOPROL-XL) 25 MG 24 hr tablet  2. How are you currently taking this medication (dosage and times per day)? As needed  3. Are you having a reaction (difficulty breathing--STAT)?   4. What is your medication issue? Patient said she still feels her heart very deep in her chest. She is not sure how often she needs to be taking this medication.   The patient states she is going to another appointment  this morning, so she won't be back until about 2:00 pm

## 2020-12-18 NOTE — Telephone Encounter (Signed)
Patient will have to do outpatient physical therapy. She has already been discharged from [facility] and I have not found another agency can accept her insurance. Many if not all home health agencies will not take [facility] Medicare as it does not pay well for home health.

## 2020-12-18 NOTE — Telephone Encounter (Addendum)
I called patient and let her know that PT would have to be outpatient due to insurance. She states she does not want to proceed with outpatient PT d/t lack of transportation. I advised that [facility] Medicare has a very low acceptance rate with [facility] agencies, let patient know that she is able to call the number on the back on her insurance card at any time and switch plans ([place] [employer] are both good). She states she will continue to do exercises by herself at home. Patient was appreciative of call.

## 2020-12-18 NOTE — Telephone Encounter (Addendum)
Spoke to pt. She report she's been experiencing episodes where she feels like her heart is racing. She state symptoms happens daily depending on what she's doing but more noticeable after drinking coffee, chocolate, or feeling anxious.   Pt report she has only been taking Metoprolol 25 mg daily as needed as instructed by D. C on 10/02/20 Will forward for recommendations.

## 2020-12-18 NOTE — Addendum Note (Signed)
Addended by: Wyvonnia Lora on: 12/18/2020 07:57 AM   Modules accepted: Orders

## 2020-12-19 NOTE — Telephone Encounter (Signed)
Called the patient concerning her increased palpitations. She stated that she normally takes the Metoprolol 2-3 times a week. She took one yesterday and has been advised that she can take it today as well if she has the palpitations. She denies any other symptoms besides the palpitations.   She has been advised to stop drinking caffeine because this can make it worse. She also stated that she has been stressed lately and feels like this has been making her palpitations worse. She has been advised to try and think of things that are calming to her and that she may find relaxing.   She has been advised that Dr. Sallyanne Kuster is currently out of office. We will call back when he returns if he has any other recommendations.

## 2020-12-21 ENCOUNTER — Ambulatory Visit: Payer: Medicare Other | Admitting: Cardiovascular Disease

## 2020-12-24 ENCOUNTER — Telehealth: Payer: Self-pay | Admitting: Neurology

## 2020-12-24 NOTE — Telephone Encounter (Signed)
Called pt back. Her sx have improved since taking the medication. However, I did educate her that trazodone is for her to take at bedtime for her sleep. This is not for muscle spasms. It would be more appropriate for her to take baclofen that she has prescribed as needed for muscle spasms. She verbalized understanding and appreciation.

## 2020-12-24 NOTE — Telephone Encounter (Signed)
Pt called stating that her toes curled up and made it hard to walk and is wanting to discuss with RN. Pt states that she just took a half pill of her traZODone (DESYREL) 100 MG tablet and a full pill of her  rOPINIRole (REQUIP) 0.5 MG tablet. Please advise.

## 2020-12-26 ENCOUNTER — Ambulatory Visit: Payer: Medicare Other | Admitting: Family Medicine

## 2020-12-29 DIAGNOSIS — B356 Tinea cruris: Secondary | ICD-10-CM | POA: Diagnosis not present

## 2020-12-29 DIAGNOSIS — L299 Pruritus, unspecified: Secondary | ICD-10-CM | POA: Diagnosis not present

## 2021-01-08 ENCOUNTER — Telehealth: Payer: Self-pay | Admitting: Neurology

## 2021-01-08 NOTE — Telephone Encounter (Signed)
Called the patient back. She recently saw her gynecologist and they prescribed 2 creams for her to use and she just wanted to update her medication list to reflect those additional medications.  I have added the 2 creams that she is taking to her med list.  Patient was appreciative for the call back.

## 2021-01-08 NOTE — Telephone Encounter (Addendum)
Pt called needing to discuss a vaginal creme that was prescribed to her recently. Pt was not able to give any more information. Pt was very rude and irate and just demanded to be called back. Please advise.

## 2021-01-11 ENCOUNTER — Telehealth: Payer: Self-pay | Admitting: Neurology

## 2021-01-11 NOTE — Telephone Encounter (Signed)
Bretlyn from [facility] called stating pt is requesting a refill for the solifenacin (VESICARE) 10 MG tablet and Vibegron (GEMTESA) 75 MG TABS. Pharmacy [facility] - [place], [place] - [address].

## 2021-01-14 ENCOUNTER — Telehealth: Payer: Self-pay | Admitting: Neurology

## 2021-01-14 ENCOUNTER — Other Ambulatory Visit: Payer: Self-pay

## 2021-01-14 DIAGNOSIS — R3915 Urgency of urination: Secondary | ICD-10-CM

## 2021-01-14 DIAGNOSIS — R350 Frequency of micturition: Secondary | ICD-10-CM

## 2021-01-14 MED ORDER — SOLIFENACIN SUCCINATE 10 MG PO TABS: 10.0000 mg | ORAL_TABLET | Freq: Every day | ORAL | 1 refills | Status: DC

## 2021-01-14 MED ORDER — GEMTESA 75 MG PO TABS: 1.0000 | ORAL_TABLET | Freq: Every day | ORAL | 5 refills | Status: DC

## 2021-01-14 NOTE — Telephone Encounter (Signed)
Called and spoke w/ pharmacy. Confirmed medication for overactive bladder. They verbalized understanding, nothing further needed.

## 2021-01-14 NOTE — Telephone Encounter (Signed)
Refills sent to requested pharmacy. 

## 2021-01-14 NOTE — Telephone Encounter (Signed)
Ally from [facility] called stating that the pt is requesting that Indication Labels are put on her medication bottles. Pt is wanting her medication bottles to state why or what the pt is taking that medication for. Ally also requested the RN to call her back to confirm if the pt's solifenacin (VESICARE) 10 MG tablet Is for her overactive bladder. Please advise.

## 2021-01-15 ENCOUNTER — Telehealth: Payer: Self-pay | Admitting: Neurology

## 2021-01-15 DIAGNOSIS — I10 Essential (primary) hypertension: Secondary | ICD-10-CM | POA: Diagnosis not present

## 2021-01-15 DIAGNOSIS — E7800 Pure hypercholesterolemia, unspecified: Secondary | ICD-10-CM | POA: Diagnosis not present

## 2021-01-15 DIAGNOSIS — G4700 Insomnia, unspecified: Secondary | ICD-10-CM | POA: Diagnosis not present

## 2021-01-15 DIAGNOSIS — K219 Gastro-esophageal reflux disease without esophagitis: Secondary | ICD-10-CM | POA: Diagnosis not present

## 2021-01-15 NOTE — Telephone Encounter (Signed)
[name]) callled, Pt taking Gemtesa 75 mg but she was on Vesicare back in August. Pt had been getting dizzy, we thought Vesicare was making Pt dizzy.  Since August she has been off Vesicare. Pt requesting to start back on Vesicare because she is urinating a lot also has a severe yeast infection. Pt asking if she can start back on the Vesicare to help with her urinating a lot. Would like a call from the nurse.

## 2021-01-15 NOTE — Telephone Encounter (Signed)
Called pt back. She has had more shaking in hands/arms for last 2-3 months. This is intermittent. Worse this morning while laying down on sofa. Having OB issues (burning pain). OB placed her on estradiol vaginal cream. Started this 01/02/21. Has not started any other new meds. Denies any signs of infection. Feels she is drinking plenty of fluids/not dehydrated. Has nauseated stomach (chronic issue for the last 27months-46yr). PCP planning on doing physical on her 02/07/21. She will discuss sx further at this appt.   I recommended if she feels this is urgent, to to to UC. She declined. States she cannot get there. Does not have transportation. Asked how she was getting to PCP office on 02/07/21. She scheduled SCAT. Recommended she call to schedule [facility] if need be for UC. She declined. She will wait until PCP appt.

## 2021-01-15 NOTE — Telephone Encounter (Signed)
Pt called stating she is shaking really bad. Pt requesting a call back.

## 2021-01-15 NOTE — Telephone Encounter (Signed)
LVM for [name] letting her know it is ok for her to restart Vesicare. Rx refill [facility] to [facility] 01/14/[facility]. Advised her to call back if any further questions.

## 2021-01-15 NOTE — Telephone Encounter (Signed)
Pt called, wanted [name] to know I apologize for interrupting her.

## 2021-01-28 ENCOUNTER — Telehealth: Payer: Self-pay | Admitting: Neurology

## 2021-01-28 MED ORDER — GABAPENTIN 100 MG PO CAPS: ORAL_CAPSULE | ORAL | 1 refills | Status: DC

## 2021-01-28 NOTE — Telephone Encounter (Signed)
Pt called states she's been having really bad headaches for the pas few weeks and nothing seems to be helping. Pt requesting a call back.

## 2021-01-28 NOTE — Addendum Note (Signed)
Addended by: Darleen Crocker on: 01/28/2021 05:44 PM   Modules accepted: Orders

## 2021-01-28 NOTE — Telephone Encounter (Signed)
The extensive amount of medications that [name] is taking, Dr. Felecia Shelling recommends increasing the gabapentin.  The gabapentin will be increased to 4 pills a day taking 1 in the morning 1 midday and 2 at bedtime. I will advise the patient of this

## 2021-01-28 NOTE — Telephone Encounter (Signed)
Called the patient and made her aware of the increase in the gabapentin.  Patient is asking I [facility].  Patient was appreciative for the call back.

## 2021-01-28 NOTE — Telephone Encounter (Signed)
Called the pt back.  Patient states that she has been having headaches intermittently for several months.  She states that they are becoming more frequent and worsening.  In September at her last visit Dr. Felecia Shelling did trigger point injections 60 mg Depr-medrol with 3 cc marcaine.  She states that she did get some relief at first but they have not started back up.  She is waking up with these headaches most days and when she lays down at night they seem to get worse as well.  Other than the trigger point injections, she has taken Tylenol over-the-counter with no success in treating the headaches.  Advised the patient I would discuss with Dr. Felecia Shelling and we will have to look over her medication list as well.  Advised I will contact the patient back after discussing with Dr. Felecia Shelling.  Patient was appreciative and verbalized understanding.  Patient requested not to call back until after 2 PM.

## 2021-02-07 DIAGNOSIS — G35 Multiple sclerosis: Secondary | ICD-10-CM | POA: Diagnosis not present

## 2021-02-07 DIAGNOSIS — K219 Gastro-esophageal reflux disease without esophagitis: Secondary | ICD-10-CM | POA: Diagnosis not present

## 2021-02-07 DIAGNOSIS — I10 Essential (primary) hypertension: Secondary | ICD-10-CM | POA: Diagnosis not present

## 2021-02-07 DIAGNOSIS — R7303 Prediabetes: Secondary | ICD-10-CM | POA: Diagnosis not present

## 2021-02-07 DIAGNOSIS — Z1389 Encounter for screening for other disorder: Secondary | ICD-10-CM | POA: Diagnosis not present

## 2021-02-07 DIAGNOSIS — Z0000 Encounter for general adult medical examination without abnormal findings: Secondary | ICD-10-CM | POA: Diagnosis not present

## 2021-02-07 DIAGNOSIS — E7800 Pure hypercholesterolemia, unspecified: Secondary | ICD-10-CM | POA: Diagnosis not present

## 2021-02-11 ENCOUNTER — Telehealth: Payer: Self-pay | Admitting: Neurology

## 2021-02-11 NOTE — Telephone Encounter (Signed)
Called the pt back. She states that the headaches intermittently continue to come and go. She has increased the gabapentin as prescribed earlier in the month. Some days are worse than other days and she doesn't understand why that is. She states that it is worse when she lays down. She has had to get out of the bed and walk around until it goes away at time. She states that Advised the pt that sometimes weather changes can cause increase in headaches. She takes tylenol as needed and states that its not helping much. I asked the pain has she tried alternating heat and ice to see if that provides relief. She states she had not but she will try. Advised that when we last spoke, Dr Felecia Shelling was limited on what medications could be prescribed and she states she is not looking to add another medication. Advised I would still make sure he is aware and see what he thinks.

## 2021-02-11 NOTE — Telephone Encounter (Signed)
Pt called states she is having a severe head in her temple area and also in the back of her head. Pt requesting a call back.

## 2021-02-11 NOTE — Telephone Encounter (Signed)
Called the pt back and was able to inform her that discussed with Dr Felecia Shelling and he agrees that for now we should continue current treatment medication.  He does not have anything additional that he can add for the patient.  He agrees to continue to use ice packs to help as well as journal when the intensity is at its worst to see if we can pinpoint what is causing the headaches to be worse.  Patient was agreeable to the plan and states she will give [place] a call if she has any questions

## 2021-02-20 ENCOUNTER — Telehealth: Payer: Self-pay | Admitting: Neurology

## 2021-02-20 DIAGNOSIS — G4700 Insomnia, unspecified: Secondary | ICD-10-CM | POA: Diagnosis not present

## 2021-02-20 DIAGNOSIS — I10 Essential (primary) hypertension: Secondary | ICD-10-CM | POA: Diagnosis not present

## 2021-02-20 DIAGNOSIS — E7800 Pure hypercholesterolemia, unspecified: Secondary | ICD-10-CM | POA: Diagnosis not present

## 2021-02-20 DIAGNOSIS — K219 Gastro-esophageal reflux disease without esophagitis: Secondary | ICD-10-CM | POA: Diagnosis not present

## 2021-02-20 NOTE — Telephone Encounter (Signed)
Courtney @ [employer] pt called them about swelling in her feet and ankles around lunch time.  Loma Sousa said pt asked them to contact Dr Felecia Shelling on this to see if there needs to be a change to her Gabapentin.  Loma Sousa has suggested RN just calls pt instead of calling them back since this is not what they would generally handle for her.

## 2021-02-21 NOTE — Telephone Encounter (Signed)
Called the patient. There was no answer. Left a detailed message advising that we received a call from [facility]. Advised the message we received was she had swelling in feet and legs. The message was left for [place] because they think this is r/t to increase in the gabapentin. Pt has been on gabapentin for some time and the increase was made in early November. While there are reported side effects with swelling in gabapentin, Dr Felecia Shelling does not feel it is related to the gabapentin. He wanted me to ask the patient if/when she noticed the swelling, but that occurred after making the increase in the medication.  If it did then we may need to consider backing the dose back down. Advised the patient to call back

## 2021-02-27 ENCOUNTER — Telehealth: Payer: Self-pay | Admitting: Neurology

## 2021-02-27 NOTE — Telephone Encounter (Signed)
Called pt back. She feels she is on too much medication. She wakes feeling drunk. Has been going on for the past 3-4 days. Does not feel she can make decisions for herself. Very concerned about this. Wants to know if she can come off of anything or go on any lower doses for anything that Dr. Felecia Shelling prescribes.

## 2021-02-27 NOTE — Telephone Encounter (Signed)
Pt called wanting an RN or provider to call her back to discuss her medications. Pt states that she feels she is taking to many medications and does not like feeling groggy all the time. Please advise.

## 2021-02-28 NOTE — Telephone Encounter (Signed)
LVM for pt to call office

## 2021-02-28 NOTE — Telephone Encounter (Signed)
Pt called back. Took call from phone staff. She will take trazodone 100mg  (1/2 po qhs) and baclofen 10mg  po BID (instead of TID-she will cut out midday dose). She will call back if she continue to experience drowsiness.

## 2021-03-05 DIAGNOSIS — E7800 Pure hypercholesterolemia, unspecified: Secondary | ICD-10-CM | POA: Diagnosis not present

## 2021-03-05 DIAGNOSIS — I10 Essential (primary) hypertension: Secondary | ICD-10-CM | POA: Diagnosis not present

## 2021-03-21 ENCOUNTER — Telehealth: Payer: Self-pay | Admitting: Neurology

## 2021-03-21 NOTE — Telephone Encounter (Signed)
Pt called needing to speak to the RN regard her feeling wobbly constantly now and has to even be careful when she making something to eat because it can be dangerous. Please advise.

## 2021-03-21 NOTE — Telephone Encounter (Signed)
LVM returning pt call. 

## 2021-03-21 NOTE — Telephone Encounter (Signed)
Took call from phone staff and spoke w/ pt. Using walker most of the time in the house. Gets dizzy intermittently. Worsened about a week to 10 days ago. Does not sound like she lowered her baclofen or trazodone dose as previously discussed on 02/28/21: "Pt called back. Took call from phone staff. She will take trazodone 100mg  (1/2 po qhs) and baclofen 10mg  po BID (instead of TID-she will cut out midday dose). She will call back if she continue to experience drowsiness."  She will try to make these adjustments now and see if this helps. She will call back if ineffective.  I had her write down directions and had her place by medications as a reminder.

## 2021-03-26 ENCOUNTER — Telehealth: Payer: Self-pay | Admitting: Neurology

## 2021-03-26 DIAGNOSIS — R269 Unspecified abnormalities of gait and mobility: Secondary | ICD-10-CM

## 2021-03-26 DIAGNOSIS — R5383 Other fatigue: Secondary | ICD-10-CM

## 2021-03-26 DIAGNOSIS — R531 Weakness: Secondary | ICD-10-CM

## 2021-03-26 DIAGNOSIS — M5432 Sciatica, left side: Secondary | ICD-10-CM

## 2021-03-26 DIAGNOSIS — M25552 Pain in left hip: Secondary | ICD-10-CM

## 2021-03-26 DIAGNOSIS — G35 Multiple sclerosis: Secondary | ICD-10-CM

## 2021-03-26 NOTE — Telephone Encounter (Signed)
Pt called wanting to know if she can restart PT. Please advise.

## 2021-03-26 NOTE — Telephone Encounter (Signed)
Called pt back. She would like to restart in home PT. Aware we will place referral for her. She verbalized understanding.

## 2021-03-27 NOTE — Telephone Encounter (Addendum)
Patient has been previously discharged from [facility] as documented in 12/17/20 phone note. Patient has made no changes to her insurance, she still has [facility] Medicare. I have previously explained to patient that her insurance has a very low acceptance rate for home health, patient verbalized understanding of that in September 2022 when last referral was placed. I have reached out to [facility] with [facility] to see if they can possibly see patient again even though she was discharged previously as they could not help her. Will update when I receive a response.

## 2021-03-27 NOTE — Telephone Encounter (Signed)
Pt called tried offering appts in march with [facility] but pt did not want them, I then told her his next avail was in July and she said that was not fair. Does not want to speak with any of [place] I assume cause she is not getting her way. Pt requesting a call back from Nurse Terrence Dupont.

## 2021-03-27 NOTE — Telephone Encounter (Signed)
Per Meg with [facility]:  It looks like nothing has really changed and she hasn't been seen since September.  In order for [place] to see her she would need to be seen by Dr. Felecia Shelling again in person and have a note that describes what's changed / worsened.

## 2021-03-27 NOTE — Telephone Encounter (Signed)
Called pt back. Explained that phone staff is only trying to help the best they can to get her scheduled for appt.  Scheduled work in appt for 04/16/21 at 2p w/ Dr. Felecia Shelling. Pt verbalized understanding.

## 2021-03-27 NOTE — Telephone Encounter (Signed)
Called and LVM for pt relaying below info. Asked if she wanted to proceed, she can call our office back to schedule an appt. Please schedule appt if she calls back.

## 2021-04-02 ENCOUNTER — Telehealth: Payer: Self-pay | Admitting: Neurology

## 2021-04-02 NOTE — Telephone Encounter (Signed)
Called the pt back. There was no answer. Left a message advising that there doesn't appear to be any new medications that were started from what I can tell. Advised the pt can call us back but I would recommend having the name of the medication that she thinks could be causing the dry.cotton mouth.

## 2021-04-02 NOTE — Telephone Encounter (Signed)
Called pt back. She reports dry mouth started a couple of nights ago. She is breathing through her mouth and not her nose. Advised this could large part of her sx. Advised some patients have taped mouth shut while sleeping to help w/ this issue. She could look into this. She will continue to drink plenty of water.

## 2021-04-02 NOTE — Telephone Encounter (Signed)
Pt called, experiencing cotton mouth form taking a medication. Would like a call from the nurse.  Pt did not know the name of the medication.

## 2021-04-02 NOTE — Telephone Encounter (Signed)
Pt called back, phone rep asked pt if she knew what medication it was and she said no.  Please call pt back

## 2021-04-16 ENCOUNTER — Encounter: Payer: Self-pay | Admitting: Neurology

## 2021-04-16 ENCOUNTER — Ambulatory Visit (INDEPENDENT_AMBULATORY_CARE_PROVIDER_SITE_OTHER): Payer: Medicare Other | Admitting: Neurology

## 2021-04-16 ENCOUNTER — Other Ambulatory Visit: Payer: Self-pay

## 2021-04-16 VITALS — BP 140/84 | HR 64 | Ht 61.0 in | Wt 134.5 lb

## 2021-04-16 DIAGNOSIS — G35 Multiple sclerosis: Secondary | ICD-10-CM

## 2021-04-16 DIAGNOSIS — G43009 Migraine without aura, not intractable, without status migrainosus: Secondary | ICD-10-CM | POA: Diagnosis not present

## 2021-04-16 DIAGNOSIS — M5432 Sciatica, left side: Secondary | ICD-10-CM | POA: Diagnosis not present

## 2021-04-16 DIAGNOSIS — R269 Unspecified abnormalities of gait and mobility: Secondary | ICD-10-CM | POA: Diagnosis not present

## 2021-04-16 DIAGNOSIS — R5383 Other fatigue: Secondary | ICD-10-CM

## 2021-04-16 DIAGNOSIS — G35D Multiple sclerosis, unspecified: Secondary | ICD-10-CM

## 2021-04-16 MED ORDER — DALFAMPRIDINE ER 10 MG PO TB12: ORAL_TABLET | ORAL | 11 refills | Status: DC

## 2021-04-16 NOTE — Progress Notes (Signed)
[facility]  PATIENT: [name] DOB: 24-Aug-1955  REFERRING DOCTOR OR PCP:  Dr. Leta Baptist  SOURCE: patient, MRI images on PACS, imaging reports, notes in EPIC and labs in EPIC  _________________________________   HISTORICAL  CHIEF COMPLAINT:  Chief Complaint  Patient presents with   Follow-up    Rm 1, alone. Here for RLS/sleep and MS f/u, off DMT for MS. Using a rolling walker to ambulate. Has slipped and banged herself on her L side.     HISTORY OF PRESENT ILLNESS:  [name] is a 66 y.o. woman with relapsing remitting multiple sclerosis diagnosed in 1983  Update  04/16/2021 She is feeling weaker with her gait.   She feels her gait is slower and less steady.  She stopped Betaseron in 2021.    MRIs have been stable.   She has had no exacerbation but feels gait is worse.   The MRI of the brain 06/30/2020 shows extensive T2/FLAIR hyperintense foci in the hemispheres and a few foci in the pons and cerebellum in a pattern and configuration consistent with chronic demyelinating plaque associated with multiple sclerosis.  It was unchanged compared to the MRI from 06/18/2019 there could also be superimposed foci of chronic microvascular ischemic change.  She uses a walker mostly outside but a cane indoors.    Balance is also off.  She has had a fall when legs just gave out.        She has some spasticity and takes baclofen.   She has urinary frequency/urgency.   Solifenacin helps a little bit.    There has RLS and takes gabapentin and ropinirole with some benefit.    She still has insomnia helped by trazodone...   She has reduced short term memory and reduced attention and some word finding ererors.    She feels her memory is a little worse and she sometimes has trouble coming up with the right words.  Cognitive issues have slowly progress prior to discontinuing the Betaseron and all progressing at about the same rate.   She has depression and anxiety and is on combination  of sertraline and buspar.    She notes left hip and buttock pain.  It is not as bad as before the piriformis injection but it was much better a few months after the TPI.   She takes one half of the tramadol (25 mg) at night which helps her fall asleep   MS History:    In 1983, while at work, she was having difficulty with her gait and was unsteady and falling. She R doctor and was referred to Dr. Morrell Riddle of [facility] neurology who diagnosed her with multiple sclerosis. She was in the Betaseron drug study (was on placebo) with Dr. Terie Purser in [place]. When Betaseron became available in 1992 or 1993 she started the medication and was seen Dr. Erling Cruz. Over the next few years she had a couple exacerbations that involved leg function, gait or balance. She had some treatments with IV steroids. She also has had some slow worsening with her gait more recently.   MRI Review:   MRI of the brain and cervical spine from 07/25/2013. In the brain, there are extensive T2/FLAIR hyperintense foci involving the cerebellum, left posterior medulla, there was a normal enhancement pattern. The cervical spine showed hyperintense foci adjacent to C3, C5-C6, C6, and T1. Right posterior pons, bilateral hemispheres, predominantly in the periventricular cortical white matter.   None of these enhanced.  MRI of the brain  March 2020 showed Extensive T2/FLAIR hyperintense foci in the hemispheres and a few foci in the pons and cerebellum in a pattern and configuration consistent with chronic demyelinating plaque associated with multiple sclerosis.  There could also be superimposed foci of chronic microvascular ischemic change.  The MRI is essentially unchanged compared to 2011  MRI of the brain 06/18/2019 shows Extensive T2/FLAIR hyperintense foci in the hemispheres and a few foci in the pons and cerebellum in a pattern and configuration consistent with chronic demyelinating plaque associated with multiple sclerosis.  There could also be  superimposed foci of chronic microvascular ischemic change.  There were no changes compared to the MRI dated 10/28/2016  MRI of the brain 06/30/2020 shows Multiple T2/FLAIR hyperintense foci in the hemispheres, brainstem and cerebellum in a pattern and configuration consistent with chronic demyelinating plaque associated with multiple sclerosis.  None of the foci appear to be acute.  They do not enhance.  Compared to the MRI from 06/18/2019, there do not appear to be any new lesions.    REVIEW OF SYSTEMS: Constitutional: No fevers, chills, sweats, or change in appetite.  Fatigue and sleepiness.  She has maintenance insomnia Eyes: No visual changes, double vision, eye pain.   Some fatigue. She has insomnia helped by trazodone. Ear, nose and throat: No hearing loss, ear pain, nasal congestion, sore throat Cardiovascular: No chest pain, palpitations Respiratory:  No shortness of breath at rest or with exertion.   No wheezes.   She snores GastrointestinaI: No nausea, vomiting, diarrhea, abdominal pain, fecal incontinence Genitourinary:  as above Musculoskeletal:  No neck pain, back pain Integumentary: No rash, pruritus, skin lesions Neurological: as above Psychiatric: No depression at this time.  No anxiety Endocrine: No palpitations, diaphoresis, change in appetite, change in weigh or increased thirst Hematologic/Lymphatic:  No anemia, purpura, petechiae. Allergic/Immunologic: No itchy/runny eyes, nasal congestion, recent allergic reactions, rashes  ALLERGIES: Allergies  Allergen Reactions   Other Other (See Comments)    Pt states she took a medication 2-3 yrs ago that may have been for inflammation and it caused her to be weak.   Myrbetriq [Mirabegron]     Caused her to dry out/nose bleeds    HOME MEDICATIONS:  Current Outpatient Medications:    acetaminophen (TYLENOL) 500 MG tablet, Take 500 mg by mouth every 6 (six) hours as needed., Disp: , Rfl:    amLODipine (NORVASC) 10 MG tablet,  Take 10 mg by mouth daily., Disp: , Rfl:    atorvastatin (LIPITOR) 20 MG tablet, Take 20 mg by mouth daily., Disp: , Rfl:    baclofen (LIORESAL) 10 MG tablet, Take 1 tablet (10 mg total) by mouth 3 (three) times daily. (Patient taking differently: Take 10 mg by mouth 2 (two) times daily.), Disp: 90 tablet, Rfl: 11   benzonatate (TESSALON) 200 MG capsule, Take 200 mg by mouth 3 (three) times daily as needed., Disp: , Rfl:    busPIRone (BUSPAR) 15 MG tablet, TAKE 1 TABLET BY MOUTH TWICE DAILY, Disp: 180 tablet, Rfl: 3   dalfampridine 10 MG TB12, One po q12 hours, Disp: 60 tablet, Rfl: 11   esomeprazole (NEXIUM) 40 MG capsule, Take 40 mg by mouth daily., Disp: , Rfl:    estradiol (ESTRACE) 0.1 MG/GM vaginal cream, Place vaginally every evening., Disp: , Rfl:    famotidine (PEPCID) 20 MG tablet, Take 20 mg by mouth at bedtime., Disp: , Rfl:    fluticasone (FLONASE) 50 MCG/ACT nasal spray, Place 2 sprays into the nose as needed. For  stuffy nose., Disp: , Rfl:    gabapentin (NEURONTIN) 100 MG capsule, Take 1 capsule in am, 1 capsule at lunch and 2 at bedtime, Disp: 360 capsule, Rfl: 1   hydrochlorothiazide (HYDRODIURIL) 25 MG tablet, Take 1 tablet by mouth daily., Disp: , Rfl:    ibuprofen (ADVIL,MOTRIN) 600 MG tablet, Take 1 tablet by mouth every 8 (eight) hours as needed for mild pain or moderate pain. , Disp: , Rfl:    Lidocaine, Anorectal, 5 % CREA, Apply topically 3 (three) times daily as needed., Disp: , Rfl:    Loperamide HCl (ANTI-DIARRHEAL PO), Take 2 mg by mouth as needed., Disp: , Rfl:    losartan (COZAAR) 100 MG tablet, Take 1 tablet by mouth daily., Disp: , Rfl:    losartan-hydrochlorothiazide (HYZAAR) 100-25 MG tablet, Take 1 tablet by mouth daily., Disp: , Rfl:    meclizine (ANTIVERT) 25 MG tablet, Take 25 mg by mouth as needed for dizziness., Disp: , Rfl:    metoprolol succinate (TOPROL-XL) 25 MG 24 hr tablet, Take 1 tablet (25 mg total) by mouth daily as needed. As needed for  palpitations., Disp: 90 tablet, Rfl: 2   montelukast (SINGULAIR) 10 MG tablet, Take 10 mg by mouth at bedtime., Disp: , Rfl:    mupirocin ointment (BACTROBAN) 2 %, mupirocin 2 % topical ointment  APPLY A SMALL AMOUNT TO THE AFFECTED AREA BY TOPICAL ROUTE 3 TIMES PER DAY, Disp: , Rfl:    nystatin (MYCOSTATIN/NYSTOP) powder, Apply topically 4 (four) times daily., Disp: , Rfl:    nystatin cream (MYCOSTATIN), Apply 1 application topically 2 (two) times daily., Disp: , Rfl:    ondansetron (ZOFRAN) 4 MG tablet, Take 4-8 mg by mouth every 6 (six) hours as needed for nausea or vomiting., Disp: , Rfl:    potassium chloride (K-DUR) 10 MEQ tablet, Take 10 mEq by mouth daily., Disp: , Rfl:    RESTASIS 0.05 % ophthalmic emulsion, 1 drop 2 (two) times daily., Disp: , Rfl:    rOPINIRole (REQUIP) 0.5 MG tablet, Take 1 tablet in the morning, 1 tablet in the evening  and 1 tablet at bedtime, Disp: 90 tablet, Rfl: 11   sertraline (ZOLOFT) 100 MG tablet, Take 1.5 tablets (150 mg total) by mouth daily., Disp: 135 tablet, Rfl: 3   solifenacin (VESICARE) 10 MG tablet, Take 1 tablet (10 mg total) by mouth daily., Disp: 90 tablet, Rfl: 1   SUDOGEST 30 MG tablet, Take 30 mg by mouth., Disp: , Rfl:    traMADol (ULTRAM) 50 MG tablet, Take 1 tablet (50 mg total) by mouth daily., Disp: 30 tablet, Rfl: 0   traZODone (DESYREL) 100 MG tablet, Take 50 mg by mouth at bedtime., Disp: , Rfl:    Vibegron (GEMTESA) 75 MG TABS, Take 1 tablet by mouth daily., Disp: 30 tablet, Rfl: 5  PAST MEDICAL HISTORY: Past Medical History:  Diagnosis Date   Anemia    HX   Anxiety    Arthritis    Chronic kidney disease    UTI'S   Complication of anesthesia    BP RISES WHEN AWAKENING   Complication of anesthesia    "difficult waking up and can become combative"    GERD (gastroesophageal reflux disease)    H/O hiatal hernia    Headache(784.0)    Heart murmur    Hypertension    Lumbar spinal stenosis    Lumbar spondylolysis    MS  (multiple sclerosis) (HCC)    Neuromuscular disorder ([place])    MS  Urinary frequency    Wears glasses     PAST SURGICAL HISTORY: Past Surgical History:  Procedure Laterality Date   CHOLECYSTECTOMY     EYE SURGERY     BLEPHAROPLASTY   LUMBAR FUSION  05/10/2015   POSTERIOR FUSION LUMBAR SPINE  04/16/11   L4-5   TRANSFORAMINAL LUMBAR INTERBODY FUSION (TLIF) WITH PEDICLE SCREW FIXATION 1 LEVEL N/A 05/10/2015   Procedure: TRANSFORAMINAL LUMBAR INTERBODY FUSION (TLIF) WITH PEDICLE SCREW FIXATION L3-4;  Surgeon: Eustace Moore, MD;  Location: [facility];  Service: Neurosurgery;  Laterality: N/A;  TRANSFORAMINAL LUMBAR INTERBODY FUSION (TLIF) WITH PEDICLE SCREW FIXATION L3-4   UTERINE FIBROID EMBOLIZATION      FAMILY HISTORY: Family History  Problem Relation Age of Onset   Lung cancer Mother    Emphysema Father    Heart disease Other     SOCIAL HISTORY:  Social History   Socioeconomic History   Marital status: Single    Spouse name: Not on file   Number of children: 0   Years of education: 14   Highest education level: Not on file  Occupational History    Comment: Does not work - Disabled  Tobacco Use   Smoking status: Former    Packs/day: 2.00    Types: Cigarettes    Quit date: 04/08/1996    Years since quitting: 25.0   Smokeless tobacco: Never   Tobacco comments:    ALCOHOL IN PAST  Substance and Sexual Activity   Alcohol use: No   Drug use: No   Sexual activity: Yes    Birth control/protection: Post-menopausal  Other Topics Concern   Not on file  Social History Narrative   Patient does not work she is disabled. and lives at home. Patient is single.   Caffeine: 1-2 cups daily depending on the day   Right handed.   Social Determinants of Health   Financial Resource Strain: Not on file  Food Insecurity: Not on file  Transportation Needs: Not on file  Physical Activity: Not on file  Stress: Not on file  Social Connections: Not on file  Intimate Partner  Violence: Not on file     PHYSICAL EXAM  Vitals:   04/16/21 1316  BP: 140/84  Pulse: 64  Weight: 134 lb 8 oz (61 kg)  Height: 5\' 1"  (1.549 m)    Body mass index is 25.41 kg/m.    General: The patient is well-developed and well-nourished and in no acute distress.    Skin: Extremities are without rash or edema.  Neurologic Exam  Mental status: The patient is alert and oriented x 3 at the time of the examination. The patient has apparent normal recent and remote memory, with an apparently normal attention span and concentration ability.   Speech is normal.  Cranial nerves: Extraocular movements are full.  Facial strength and sensation was normal.  Trapezius strength is normal.  No obvious hearing deficits are noted.  Motor:  Muscle bulk is normal.  Muscle tone is increased in the legs.  Strength was 5/5  Sensory: Normal touch and vibration sensation in limbs.    Coordination: She has good finger-nose-finger bilaterally.  Heel-to-shin is mildly reduced  Gait and station: Station is normal.  Gait is mildly wide.  She can walk without support, in the room but is off balanced, especially with turn.  She cannot do a tandem walk.   She walks better with walker.   Romberg is borderline.   Reflexes: Deep tendon reflexes are symmetric  and normal bilaterally.     25 foot timed walk = 15.2 seconds (average of two trials)    ASSESSMENT AND PLAN  Multiple sclerosis ([place]) - Plan: Ambulatory referral to [facility], Comprehensive metabolic panel, CBC with Differential/Platelet  Gait disturbance - Plan: Ambulatory referral to Home Health  Other fatigue - Plan: Comprehensive metabolic panel, CBC with Differential/Platelet  Left sided sciatica  Migraine without aura and without status migrainosus, not intractable   1.   She will remain off of a disease modifying therapy for MS (SPMS).   2.   Dalfampridine for MS gait.    She is on a very low dose of tramadol and advised not to  increase..  We will check renal function. 3.   Stay active.  Exercise as tolerated 4,   Requip for RLS. 5.    She will return in 6 months or sooner if there are new or worsening neurologic symptoms.  I spoke with her sister over the phone, Neftaly Inzunza (sister) who is a Marine scientist during the visit.  We also have permission to speak to Dutch Gray about her care.  40-minute office visit with the majority of the time spent face-to-face for history and physical, discussion/counseling and decision-making.  Additional time with record review and documentation.   Solangel Mcmanaway A. Felecia Shelling, MD, PhD 2/68/3419, 6:22 PM Certified in Neurology, Clinical Neurophysiology, Sleep Medicine, Pain Medicine and Neuroimaging  [facility] [address], [place], [place] [address] [phone]

## 2021-04-16 NOTE — Telephone Encounter (Signed)
Referral has been sent back to [facility] for review now that patient has had an office visit. They will let us know if they can accept.

## 2021-04-17 ENCOUNTER — Telehealth: Payer: Self-pay | Admitting: *Deleted

## 2021-04-17 LAB — CBC WITH DIFFERENTIAL/PLATELET
Basophils Absolute: 0.1 10*3/uL (ref 0.0–0.2)
Basos: 1 %
EOS (ABSOLUTE): 0.1 10*3/uL (ref 0.0–0.4)
Eos: 1 %
Hematocrit: 40.9 % (ref 34.0–46.6)
Hemoglobin: 13.3 g/dL (ref 11.1–15.9)
Immature Grans (Abs): 0 10*3/uL (ref 0.0–0.1)
Immature Granulocytes: 0 %
Lymphocytes Absolute: 1 10*3/uL (ref 0.7–3.1)
Lymphs: 20 %
MCH: 28.9 pg (ref 26.6–33.0)
MCHC: 32.5 g/dL (ref 31.5–35.7)
MCV: 89 fL (ref 79–97)
Monocytes Absolute: 0.4 10*3/uL (ref 0.1–0.9)
Monocytes: 8 %
Neutrophils Absolute: 3.5 10*3/uL (ref 1.4–7.0)
Neutrophils: 70 %
Platelets: 266 10*3/uL (ref 150–450)
RBC: 4.6 x10E6/uL (ref 3.77–5.28)
RDW: 13.1 % (ref 11.7–15.4)
WBC: 5.1 10*3/uL (ref 3.4–10.8)

## 2021-04-17 LAB — COMPREHENSIVE METABOLIC PANEL
ALT: 19 IU/L (ref 0–32)
AST: 17 IU/L (ref 0–40)
Albumin/Globulin Ratio: 2.5 — ABNORMAL HIGH (ref 1.2–2.2)
Albumin: 4.7 g/dL (ref 3.8–4.8)
Alkaline Phosphatase: 108 IU/L (ref 44–121)
BUN/Creatinine Ratio: 25 (ref 12–28)
BUN: 18 mg/dL (ref 8–27)
Bilirubin Total: 0.5 mg/dL (ref 0.0–1.2)
CO2: 24 mmol/L (ref 20–29)
Calcium: 10 mg/dL (ref 8.7–10.3)
Chloride: 101 mmol/L (ref 96–106)
Creatinine, Ser: 0.73 mg/dL (ref 0.57–1.00)
Globulin, Total: 1.9 g/dL (ref 1.5–4.5)
Glucose: 105 mg/dL — ABNORMAL HIGH (ref 70–99)
Potassium: 3.7 mmol/L (ref 3.5–5.2)
Sodium: 141 mmol/L (ref 134–144)
Total Protein: 6.6 g/dL (ref 6.0–8.5)
eGFR: 91 mL/min/{1.73_m2} (ref >59)

## 2021-04-17 NOTE — Telephone Encounter (Signed)
-----   Message from Britt Bottom, MD sent at 04/17/2021  8:34 AM EST ----- Please let the patient know that the lab work is fine.

## 2021-04-17 NOTE — Telephone Encounter (Signed)
Called and spoke with pt about results per Dr. Garth Bigness note. Pt verbalized understanding.

## 2021-04-18 NOTE — Telephone Encounter (Signed)
I spoke with [name] with [phone] and she stated that they will be able to take the patient and will contact the patient to schedule.

## 2021-04-22 NOTE — Telephone Encounter (Signed)
Per Thayer Headings she emailed me and informed me that her start care is 04/25/21.

## 2021-04-25 ENCOUNTER — Ambulatory Visit: Payer: Medicare Other | Admitting: Neurology

## 2021-04-25 ENCOUNTER — Telehealth: Payer: Self-pay | Admitting: Neurology

## 2021-04-25 DIAGNOSIS — M5432 Sciatica, left side: Secondary | ICD-10-CM | POA: Diagnosis not present

## 2021-04-25 DIAGNOSIS — G35 Multiple sclerosis: Secondary | ICD-10-CM | POA: Diagnosis not present

## 2021-04-25 DIAGNOSIS — R269 Unspecified abnormalities of gait and mobility: Secondary | ICD-10-CM | POA: Diagnosis not present

## 2021-04-25 DIAGNOSIS — F32A Depression, unspecified: Secondary | ICD-10-CM | POA: Diagnosis not present

## 2021-04-25 DIAGNOSIS — W19XXXD Unspecified fall, subsequent encounter: Secondary | ICD-10-CM | POA: Diagnosis not present

## 2021-04-25 DIAGNOSIS — I10 Essential (primary) hypertension: Secondary | ICD-10-CM | POA: Diagnosis not present

## 2021-04-25 DIAGNOSIS — R5383 Other fatigue: Secondary | ICD-10-CM | POA: Diagnosis not present

## 2021-04-25 NOTE — Telephone Encounter (Signed)
Called back and LVM providing VO as requested. Asked them to cal back if they have any further questions/concerns.

## 2021-04-25 NOTE — Telephone Encounter (Signed)
Just wanted to return phone call medication, Dalfamprigine is helping me a great deal.

## 2021-04-25 NOTE — Telephone Encounter (Signed)
Betsy from [facility] called needing VO for continuing PT for 1 x 6 weeks Please advise.

## 2021-04-29 DIAGNOSIS — M5432 Sciatica, left side: Secondary | ICD-10-CM | POA: Diagnosis not present

## 2021-04-29 DIAGNOSIS — I10 Essential (primary) hypertension: Secondary | ICD-10-CM | POA: Diagnosis not present

## 2021-04-29 DIAGNOSIS — R5383 Other fatigue: Secondary | ICD-10-CM | POA: Diagnosis not present

## 2021-04-29 DIAGNOSIS — R269 Unspecified abnormalities of gait and mobility: Secondary | ICD-10-CM | POA: Diagnosis not present

## 2021-04-29 DIAGNOSIS — W19XXXD Unspecified fall, subsequent encounter: Secondary | ICD-10-CM | POA: Diagnosis not present

## 2021-04-29 DIAGNOSIS — G35 Multiple sclerosis: Secondary | ICD-10-CM | POA: Diagnosis not present

## 2021-04-29 DIAGNOSIS — F32A Depression, unspecified: Secondary | ICD-10-CM | POA: Diagnosis not present

## 2021-05-06 ENCOUNTER — Ambulatory Visit: Payer: Medicare Other | Admitting: Podiatry

## 2021-05-07 DIAGNOSIS — M797 Fibromyalgia: Secondary | ICD-10-CM | POA: Diagnosis not present

## 2021-05-07 DIAGNOSIS — R7303 Prediabetes: Secondary | ICD-10-CM | POA: Diagnosis not present

## 2021-05-07 DIAGNOSIS — G35 Multiple sclerosis: Secondary | ICD-10-CM | POA: Diagnosis not present

## 2021-05-07 DIAGNOSIS — E7800 Pure hypercholesterolemia, unspecified: Secondary | ICD-10-CM | POA: Diagnosis not present

## 2021-05-07 DIAGNOSIS — G4700 Insomnia, unspecified: Secondary | ICD-10-CM | POA: Diagnosis not present

## 2021-05-07 DIAGNOSIS — K219 Gastro-esophageal reflux disease without esophagitis: Secondary | ICD-10-CM | POA: Diagnosis not present

## 2021-05-07 DIAGNOSIS — I10 Essential (primary) hypertension: Secondary | ICD-10-CM | POA: Diagnosis not present

## 2021-05-08 DIAGNOSIS — I10 Essential (primary) hypertension: Secondary | ICD-10-CM | POA: Diagnosis not present

## 2021-05-08 DIAGNOSIS — E7800 Pure hypercholesterolemia, unspecified: Secondary | ICD-10-CM | POA: Diagnosis not present

## 2021-05-09 ENCOUNTER — Telehealth: Payer: Self-pay | Admitting: Neurology

## 2021-05-09 DIAGNOSIS — W19XXXD Unspecified fall, subsequent encounter: Secondary | ICD-10-CM | POA: Diagnosis not present

## 2021-05-09 DIAGNOSIS — R269 Unspecified abnormalities of gait and mobility: Secondary | ICD-10-CM | POA: Diagnosis not present

## 2021-05-09 DIAGNOSIS — R5383 Other fatigue: Secondary | ICD-10-CM | POA: Diagnosis not present

## 2021-05-09 DIAGNOSIS — G35 Multiple sclerosis: Secondary | ICD-10-CM | POA: Diagnosis not present

## 2021-05-09 DIAGNOSIS — M5432 Sciatica, left side: Secondary | ICD-10-CM | POA: Diagnosis not present

## 2021-05-09 DIAGNOSIS — F32A Depression, unspecified: Secondary | ICD-10-CM | POA: Diagnosis not present

## 2021-05-09 DIAGNOSIS — I10 Essential (primary) hypertension: Secondary | ICD-10-CM | POA: Diagnosis not present

## 2021-05-09 MED ORDER — GABAPENTIN 100 MG PO CAPS: 100.0000 mg | ORAL_CAPSULE | Freq: Three times a day (TID) | ORAL | 1 refills | Status: DC

## 2021-05-09 NOTE — Telephone Encounter (Signed)
Pt called and LVM stating she is needing to change the way she takes her gabapentin (NEURONTIN) 100 MG capsule Please advise.

## 2021-05-09 NOTE — Telephone Encounter (Signed)
Called and spoke with pt. She has been taking gabapentin 100mg  po TID, not Take 1 capsule in am, 1 capsule at lunch and 2 at bedtime as prescribed. Wanting [facility] dosing. Higher dose than this makes her feel drunk. MD approved, I e-scribed updated rx.

## 2021-05-13 DIAGNOSIS — R5383 Other fatigue: Secondary | ICD-10-CM | POA: Diagnosis not present

## 2021-05-13 DIAGNOSIS — F32A Depression, unspecified: Secondary | ICD-10-CM | POA: Diagnosis not present

## 2021-05-13 DIAGNOSIS — G35 Multiple sclerosis: Secondary | ICD-10-CM | POA: Diagnosis not present

## 2021-05-13 DIAGNOSIS — I10 Essential (primary) hypertension: Secondary | ICD-10-CM | POA: Diagnosis not present

## 2021-05-13 DIAGNOSIS — R269 Unspecified abnormalities of gait and mobility: Secondary | ICD-10-CM | POA: Diagnosis not present

## 2021-05-13 DIAGNOSIS — W19XXXD Unspecified fall, subsequent encounter: Secondary | ICD-10-CM | POA: Diagnosis not present

## 2021-05-13 DIAGNOSIS — M5432 Sciatica, left side: Secondary | ICD-10-CM | POA: Diagnosis not present

## 2021-05-23 DIAGNOSIS — R269 Unspecified abnormalities of gait and mobility: Secondary | ICD-10-CM | POA: Diagnosis not present

## 2021-05-23 DIAGNOSIS — G35 Multiple sclerosis: Secondary | ICD-10-CM | POA: Diagnosis not present

## 2021-05-23 DIAGNOSIS — F32A Depression, unspecified: Secondary | ICD-10-CM | POA: Diagnosis not present

## 2021-05-23 DIAGNOSIS — M5432 Sciatica, left side: Secondary | ICD-10-CM | POA: Diagnosis not present

## 2021-05-23 DIAGNOSIS — R5383 Other fatigue: Secondary | ICD-10-CM | POA: Diagnosis not present

## 2021-05-23 DIAGNOSIS — I10 Essential (primary) hypertension: Secondary | ICD-10-CM | POA: Diagnosis not present

## 2021-05-23 DIAGNOSIS — W19XXXD Unspecified fall, subsequent encounter: Secondary | ICD-10-CM | POA: Diagnosis not present

## 2021-05-27 ENCOUNTER — Telehealth: Payer: Self-pay | Admitting: Neurology

## 2021-05-27 NOTE — Telephone Encounter (Signed)
Called the pt back. There was no answer. LVM advising the pt we were returning her call and advised the pt to call back.  ? ? ?

## 2021-05-27 NOTE — Telephone Encounter (Signed)
?  Pt has called to report within the last few weeks the medication rOPINIRole (REQUIP) 0.5 MG tablet does not work like it once did, pt is asking for a call to discuss her options.  ?

## 2021-05-27 NOTE — Telephone Encounter (Signed)
Pt returned call and I was able to discuss her concerns in more detail. Pt states she continues taking the gabapentin TID along with ropinirole TID. ? More recently she is noting that she is having increase in the Rt Leg keeping her up at night and cramping sensation. She states that hard for her to fall asleep to due the discomfort.  ?Ex. She had a meeting this evening and was sitting in the meeting and she had to stand up and move around cause it was bothering her so much.  ?She states she took her afternoon dose of the ropinirole and it takes 15-20 min to kick in but helps. It last less than 6 hours.  ?She is just tired of it bothering her so much.  ?She understands limited on what can be done since there has been so many trials with medications. I advised I would discuss with Dr Felecia Shelling and see what he recommends if anything. Advised I would contact her back with his thoughts/recommendation. ?

## 2021-05-28 DIAGNOSIS — G35 Multiple sclerosis: Secondary | ICD-10-CM | POA: Diagnosis not present

## 2021-05-28 DIAGNOSIS — W19XXXD Unspecified fall, subsequent encounter: Secondary | ICD-10-CM | POA: Diagnosis not present

## 2021-05-28 DIAGNOSIS — M5432 Sciatica, left side: Secondary | ICD-10-CM | POA: Diagnosis not present

## 2021-05-28 DIAGNOSIS — I10 Essential (primary) hypertension: Secondary | ICD-10-CM | POA: Diagnosis not present

## 2021-05-28 DIAGNOSIS — F32A Depression, unspecified: Secondary | ICD-10-CM | POA: Diagnosis not present

## 2021-05-28 DIAGNOSIS — R5383 Other fatigue: Secondary | ICD-10-CM | POA: Diagnosis not present

## 2021-05-28 DIAGNOSIS — R269 Unspecified abnormalities of gait and mobility: Secondary | ICD-10-CM | POA: Diagnosis not present

## 2021-05-28 MED ORDER — ROPINIROLE HCL 0.5 MG PO TABS: ORAL_TABLET | ORAL | 11 refills | Status: DC

## 2021-05-28 NOTE — Telephone Encounter (Signed)
Called the pt to advise that Dr Felecia Shelling would recommend adding an additional tablet with her bedtime dose. I instructed this would mean she would take 1 tablet in morning, 1 tablet in the evening and 2 tablet at bedtime.  ?I have updated the script to reflect this change and will send to Dr Felecia Shelling. Pt verbalized understanding and was appreciative for the call back. ?

## 2021-05-28 NOTE — Addendum Note (Signed)
Addended by: Darleen Crocker on: 05/28/2021 10:38 AM ? ? Modules accepted: Orders ? ?

## 2021-05-31 DIAGNOSIS — L0889 Other specified local infections of the skin and subcutaneous tissue: Secondary | ICD-10-CM | POA: Diagnosis not present

## 2021-05-31 DIAGNOSIS — L400 Psoriasis vulgaris: Secondary | ICD-10-CM | POA: Diagnosis not present

## 2021-06-04 DIAGNOSIS — E7800 Pure hypercholesterolemia, unspecified: Secondary | ICD-10-CM | POA: Diagnosis not present

## 2021-06-04 DIAGNOSIS — I10 Essential (primary) hypertension: Secondary | ICD-10-CM | POA: Diagnosis not present

## 2021-06-05 DIAGNOSIS — G35 Multiple sclerosis: Secondary | ICD-10-CM | POA: Diagnosis not present

## 2021-06-05 DIAGNOSIS — R5383 Other fatigue: Secondary | ICD-10-CM | POA: Diagnosis not present

## 2021-06-05 DIAGNOSIS — F32A Depression, unspecified: Secondary | ICD-10-CM | POA: Diagnosis not present

## 2021-06-05 DIAGNOSIS — I10 Essential (primary) hypertension: Secondary | ICD-10-CM | POA: Diagnosis not present

## 2021-06-05 DIAGNOSIS — W19XXXD Unspecified fall, subsequent encounter: Secondary | ICD-10-CM | POA: Diagnosis not present

## 2021-06-05 DIAGNOSIS — M5432 Sciatica, left side: Secondary | ICD-10-CM | POA: Diagnosis not present

## 2021-06-05 DIAGNOSIS — R269 Unspecified abnormalities of gait and mobility: Secondary | ICD-10-CM | POA: Diagnosis not present

## 2021-06-11 DIAGNOSIS — F32A Depression, unspecified: Secondary | ICD-10-CM | POA: Diagnosis not present

## 2021-06-11 DIAGNOSIS — R5383 Other fatigue: Secondary | ICD-10-CM | POA: Diagnosis not present

## 2021-06-11 DIAGNOSIS — G35 Multiple sclerosis: Secondary | ICD-10-CM | POA: Diagnosis not present

## 2021-06-11 DIAGNOSIS — R269 Unspecified abnormalities of gait and mobility: Secondary | ICD-10-CM | POA: Diagnosis not present

## 2021-06-11 DIAGNOSIS — I10 Essential (primary) hypertension: Secondary | ICD-10-CM | POA: Diagnosis not present

## 2021-06-11 DIAGNOSIS — W19XXXD Unspecified fall, subsequent encounter: Secondary | ICD-10-CM | POA: Diagnosis not present

## 2021-06-11 DIAGNOSIS — M5432 Sciatica, left side: Secondary | ICD-10-CM | POA: Diagnosis not present

## 2021-06-12 ENCOUNTER — Ambulatory Visit: Payer: Medicare Other | Admitting: Neurology

## 2021-06-18 ENCOUNTER — Ambulatory Visit: Payer: Medicare Other | Admitting: Neurology

## 2021-06-19 DIAGNOSIS — R269 Unspecified abnormalities of gait and mobility: Secondary | ICD-10-CM | POA: Diagnosis not present

## 2021-06-19 DIAGNOSIS — F32A Depression, unspecified: Secondary | ICD-10-CM | POA: Diagnosis not present

## 2021-06-19 DIAGNOSIS — R5383 Other fatigue: Secondary | ICD-10-CM | POA: Diagnosis not present

## 2021-06-19 DIAGNOSIS — G35 Multiple sclerosis: Secondary | ICD-10-CM | POA: Diagnosis not present

## 2021-06-19 DIAGNOSIS — M5432 Sciatica, left side: Secondary | ICD-10-CM | POA: Diagnosis not present

## 2021-06-19 DIAGNOSIS — W19XXXD Unspecified fall, subsequent encounter: Secondary | ICD-10-CM | POA: Diagnosis not present

## 2021-06-19 DIAGNOSIS — I10 Essential (primary) hypertension: Secondary | ICD-10-CM | POA: Diagnosis not present

## 2021-06-25 ENCOUNTER — Telehealth: Payer: Self-pay | Admitting: Neurology

## 2021-06-25 DIAGNOSIS — G35 Multiple sclerosis: Secondary | ICD-10-CM

## 2021-06-25 DIAGNOSIS — R531 Weakness: Secondary | ICD-10-CM

## 2021-06-25 MED ORDER — PREDNISONE 50 MG PO TABS: 600.0000 mg | ORAL_TABLET | Freq: Every day | ORAL | 0 refills | Status: DC

## 2021-06-25 NOTE — Telephone Encounter (Signed)
Pt said for a couple of days legs feel like pieces of noodles, but not in any pain. Afraid may fall if walk anywhere. Would like a call from the nurse. ?

## 2021-06-25 NOTE — Addendum Note (Signed)
Addended by: Wyvonnia Lora on: 06/25/2021 04:10 PM ? ? Modules accepted: Orders ? ?

## 2021-06-25 NOTE — Telephone Encounter (Signed)
[employer] medicare order sent to [facility], [facility] they will reach out to the patient to schedule.  

## 2021-06-25 NOTE — Telephone Encounter (Signed)
Pt called stating that she spoke to her PCP and she has decided to go ahead and start on the Prednisone. Pt would like to have it called in to the [facility]  ?

## 2021-06-25 NOTE — Telephone Encounter (Signed)
Called pt back. Relayed Dr. Garth Bigness message. He did not have any openings this week. Scheduled appt for 07/03/21 at 3:00pm with Dr. Felecia Shelling. Pt preferred to come in for appt. She was not comfortable with high dose steroids, declined. She agreed to imaging. Verified no allergies to contrast dye and ok to completed at [facility]. I placed orders. Made her aware they will need to get insurance auth first and then will call to get her scheduled. ? ?I already spoke w/ her about possible infection like UTI/illness. She reported declined any signs of either.  ? ?She will proceed to ER if symptoms become severe. She will call 911 as she does not have a car if she does feel she needs to go. ?

## 2021-06-25 NOTE — Telephone Encounter (Signed)
E-scribed rx to pharmacy.  

## 2021-06-25 NOTE — Telephone Encounter (Signed)
Called pt.  Pt reports she has been doing ok until yesterday morning and this morning, where she feels like there are huge rubber band around thighs/legs. Cannot walk very well with walker. Reports she has been taking medications as prescribed. No signs of infection or illness right now. She went through PT previously which helped. Balance issues have continued intermittently. She has to turn around slowly to avoid falling. Aware I will speak with MD and call her back w/ recommendation. ? ?Pt last seen 04/16/21. Next f/u scheduled for 10/15/21. Off DMT for MS. ?

## 2021-06-26 ENCOUNTER — Other Ambulatory Visit: Payer: Self-pay | Admitting: *Deleted

## 2021-06-26 NOTE — Telephone Encounter (Signed)
Pt would like a call back to discuss MRI.  ?

## 2021-06-26 NOTE — Telephone Encounter (Signed)
Took call from phone staff and spoke w/ pt. She is feeling better today. Rubberband feeling around legs/thighs has resolved. She would refer to keep appt 07/03/21 w/ Dr. Felecia Shelling for further evaluation/determine if MRI's still needed. If [facility] calls, she will let them know to hold off on scheduling and will call back to schedule after seeing MD if needed. Nothing further needed. ?

## 2021-06-26 NOTE — Telephone Encounter (Signed)
Called and LVM for pt. Explained d/t new sx she is reporting, Dr. Felecia Shelling wants to check MRI brain and cervical (neck) to make sure there are no new developments that could explain her symptoms. Recommended she call [facility] to schedule MRI's. ? ?If she calls back, please send teams message to see if we can pick up call. We have called three times this morning and it goes to her VM.  ?

## 2021-06-26 NOTE — Telephone Encounter (Signed)
LVM returning pt call. 

## 2021-06-26 NOTE — Telephone Encounter (Signed)
Pt said she will not taking Prednisone, get side effects from it. Would like a call from the nurse. ?

## 2021-06-26 NOTE — Telephone Encounter (Signed)
Called pt back. Advised I spoke w/ Hilda Blades who informed me that she will not be taking prednisone. I cx prednisone sent yesterday at [facility]/spoke w/ Hailey. Relayed she should still proceed w/ MRI's ordered. Orders sent to [facility]. She can call them at [phone] to schedule these at her earliest convenience and we will see her next week for her appt. Asked her to call back if she has any further questions/concerns. ?

## 2021-06-27 ENCOUNTER — Other Ambulatory Visit: Payer: Self-pay | Admitting: Neurology

## 2021-07-03 ENCOUNTER — Ambulatory Visit (INDEPENDENT_AMBULATORY_CARE_PROVIDER_SITE_OTHER): Payer: Medicare Other | Admitting: Neurology

## 2021-07-03 ENCOUNTER — Encounter: Payer: Self-pay | Admitting: Neurology

## 2021-07-03 VITALS — BP 126/99 | HR 66 | Ht 61.0 in | Wt 139.5 lb

## 2021-07-03 DIAGNOSIS — R3915 Urgency of urination: Secondary | ICD-10-CM

## 2021-07-03 DIAGNOSIS — G35 Multiple sclerosis: Secondary | ICD-10-CM | POA: Diagnosis not present

## 2021-07-03 DIAGNOSIS — R5383 Other fatigue: Secondary | ICD-10-CM | POA: Diagnosis not present

## 2021-07-03 DIAGNOSIS — R269 Unspecified abnormalities of gait and mobility: Secondary | ICD-10-CM

## 2021-07-03 DIAGNOSIS — G2581 Restless legs syndrome: Secondary | ICD-10-CM | POA: Diagnosis not present

## 2021-07-03 DIAGNOSIS — R413 Other amnesia: Secondary | ICD-10-CM | POA: Diagnosis not present

## 2021-07-03 NOTE — Progress Notes (Signed)
? ?[facility] ? ?PATIENT: [name] ?DOB: 09/22/1955 ? ?REFERRING DOCTOR OR PCP:  Dr. Leta Baptist  ?SOURCE: patient, MRI images on PACS, imaging reports, notes in EPIC and labs in EPIC ? ?_________________________________ ? ? ?HISTORICAL ? ?CHIEF COMPLAINT:  ?Chief Complaint  ?Patient presents with  ? Follow-up  ?  RM 1, alone. Ambulates with walker. Feels RLS medication only works sometimes.   ? ? ?HISTORY OF PRESENT ILLNESS:  ?[name] is a 66 y.o. woman with relapsing remitting multiple sclerosis diagnosed in 1983 ? ?Update  07/03/2021 ?She stopped Betaseron in 2021 and remains off of any disease modifying therapy.  She has not had any exacerbations though she does feel her gait is gradually doing worse..    The MRI of the brain 06/30/2020 shows extensive T2/FLAIR hyperintense foci in the hemispheres and a few foci in the pons and cerebellum in a pattern and configuration consistent with chronic demyelinating plaque associated with multiple sclerosis.  It was unchanged compared to the MRI from 06/18/2019 there could also be superimposed foci of chronic microvascular ischemic change. ? ?She notes her legs seem to vibrate when she sits down.  This is not painful.    She has a reduced gait.  The left leg is mildly weaker and stiffer than the right.   .   She uses a walker.   She notes lightheadedness, spinning and dizziness and has fallen some.   She uses a walker mostly outside but a cane indoors.    Balance is off.   She feels the dalfampridine has helped her gait.    She does take concurrent tramadol but just 25 mg po qHS and we discussed risk of seizures.     She has some spasticity and takes baclofen.   Solifenacin helps her urinary frequency/urgency.  ? ?There has RLS and takes gabapentin and ropinirole with some benefit.    She is also on trazodone due to insomnia.   She has reduced short term memory and reduced attention and some word finding ererors.  This has also gradually worsened over  the years.   She has depression and anxiety and is on combination of sertraline and buspar.   ? ?She notes left hip and buttock pain.  It is not as bad as before the piriformis injection but it was much better a few months after the TPI.   She takes one half of the tramadol (25 mg) at night which helps her fall asleep ? ? ?MS History:    In 1983, while at work, she was having difficulty with her gait and was unsteady and falling. She R doctor and was referred to Dr. Morrell Riddle of [facility] neurology who diagnosed her with multiple sclerosis. She was in the Betaseron drug study (was on placebo) with Dr. Terie Purser in [place]. When Betaseron became available in 1992 or 1993 she started the medication and was seen Dr. Erling Cruz. Over the next few years she had a couple exacerbations that involved leg function, gait or balance. She had some treatments with IV steroids. She also has had some slow worsening with her gait more recently.  ? ?MRI Review:   ?MRI of the brain and cervical spine from 07/25/2013. In the brain, there are extensive T2/FLAIR hyperintense foci involving the cerebellum, left posterior medulla, there was a normal enhancement pattern. The cervical spine showed hyperintense foci adjacent to C3, C5-C6, C6, and T1. Right posterior pons, bilateral hemispheres, predominantly in the periventricular cortical white matter.   None  of these enhanced. ? ?MRI of the brain March 2020 showed Extensive T2/FLAIR hyperintense foci in the hemispheres and a few foci in the pons and cerebellum in a pattern and configuration consistent with chronic demyelinating plaque associated with multiple sclerosis.  There could also be superimposed foci of chronic microvascular ischemic change.  The MRI is essentially unchanged compared to 2011 ? ?MRI of the brain 06/18/2019 shows Extensive T2/FLAIR hyperintense foci in the hemispheres and a few foci in the pons and cerebellum in a pattern and configuration consistent with chronic demyelinating  plaque associated with multiple sclerosis.  There could also be superimposed foci of chronic microvascular ischemic change.  There were no changes compared to the MRI dated 10/28/2016 ? ?MRI of the brain 06/30/2020 shows Multiple T2/FLAIR hyperintense foci in the hemispheres, brainstem and cerebellum in a pattern and configuration consistent with chronic demyelinating plaque associated with multiple sclerosis.  None of the foci appear to be acute.  They do not enhance.  Compared to the MRI from 06/18/2019, there do not appear to be any new lesions. ? ? ? ?REVIEW OF SYSTEMS: ?Constitutional: No fevers, chills, sweats, or change in appetite.  Fatigue and sleepiness.  She has maintenance insomnia ?Eyes: No visual changes, double vision, eye pain.   Some fatigue. She has insomnia helped by trazodone. ?Ear, nose and throat: No hearing loss, ear pain, nasal congestion, sore throat ?Cardiovascular: No chest pain, palpitations ?Respiratory:  No shortness of breath at rest or with exertion.   No wheezes.   She snores ?GastrointestinaI: No nausea, vomiting, diarrhea, abdominal pain, fecal incontinence ?Genitourinary:  as above ?Musculoskeletal:  No neck pain, back pain ?Integumentary: No rash, pruritus, skin lesions ?Neurological: as above ?Psychiatric: No depression at this time.  No anxiety ?Endocrine: No palpitations, diaphoresis, change in appetite, change in weigh or increased thirst ?Hematologic/Lymphatic:  No anemia, purpura, petechiae. ?Allergic/Immunologic: No itchy/runny eyes, nasal congestion, recent allergic reactions, rashes ? ?ALLERGIES: ?Allergies  ?Allergen Reactions  ? Other Other (See Comments)  ?  Pt states she took a medication 2-3 yrs ago that may have been for inflammation and it caused her to be weak.  ? Myrbetriq [Mirabegron]   ?  Caused her to dry out/nose bleeds  ? ? ?HOME MEDICATIONS: ? ?Current Outpatient Medications:  ?  acetaminophen (TYLENOL) 500 MG tablet, Take 500 mg by mouth every 6 (six) hours as  needed., Disp: , Rfl:  ?  amLODipine (NORVASC) 10 MG tablet, Take 10 mg by mouth daily., Disp: , Rfl:  ?  atorvastatin (LIPITOR) 20 MG tablet, Take 20 mg by mouth daily., Disp: , Rfl:  ?  baclofen (LIORESAL) 10 MG tablet, Take 1 tablet (10 mg total) by mouth 3 (three) times daily. (Patient taking differently: Take 10 mg by mouth 2 (two) times daily.), Disp: 90 tablet, Rfl: 11 ?  benzonatate (TESSALON) 200 MG capsule, Take 200 mg by mouth 3 (three) times daily as needed., Disp: , Rfl:  ?  busPIRone (BUSPAR) 15 MG tablet, TAKE 1 TABLET BY MOUTH TWICE DAILY, Disp: 180 tablet, Rfl: 3 ?  dalfampridine 10 MG TB12, One po q12 hours, Disp: 60 tablet, Rfl: 11 ?  esomeprazole (NEXIUM) 40 MG capsule, Take 40 mg by mouth daily., Disp: , Rfl:  ?  estradiol (ESTRACE) 0.1 MG/GM vaginal cream, Place vaginally every evening., Disp: , Rfl:  ?  famotidine (PEPCID) 20 MG tablet, Take 20 mg by mouth at bedtime., Disp: , Rfl:  ?  fluticasone (FLONASE) 50 MCG/ACT nasal spray, Place  2 sprays into the nose as needed. For stuffy nose., Disp: , Rfl:  ?  gabapentin (NEURONTIN) 100 MG capsule, Take 1 capsule (100 mg total) by mouth 3 (three) times daily., Disp: 270 capsule, Rfl: 1 ?  hydrochlorothiazide (HYDRODIURIL) 25 MG tablet, Take 1 tablet by mouth daily., Disp: , Rfl:  ?  ibuprofen (ADVIL,MOTRIN) 600 MG tablet, Take 1 tablet by mouth every 8 (eight) hours as needed for mild pain or moderate pain. , Disp: , Rfl:  ?  Lidocaine, Anorectal, 5 % CREA, Apply topically 3 (three) times daily as needed., Disp: , Rfl:  ?  Loperamide HCl (ANTI-DIARRHEAL PO), Take 2 mg by mouth as needed., Disp: , Rfl:  ?  losartan (COZAAR) 100 MG tablet, Take 1 tablet by mouth daily., Disp: , Rfl:  ?  losartan-hydrochlorothiazide (HYZAAR) 100-25 MG tablet, Take 1 tablet by mouth daily., Disp: , Rfl:  ?  meclizine (ANTIVERT) 25 MG tablet, Take 25 mg by mouth as needed for dizziness., Disp: , Rfl:  ?  metoprolol succinate (TOPROL-XL) 25 MG 24 hr tablet, Take 1 tablet  (25 mg total) by mouth daily as needed. As needed for palpitations., Disp: 90 tablet, Rfl: 2 ?  montelukast (SINGULAIR) 10 MG tablet, Take 10 mg by mouth at bedtime., Disp: , Rfl:  ?  mupirocin oint

## 2021-07-04 DIAGNOSIS — E7800 Pure hypercholesterolemia, unspecified: Secondary | ICD-10-CM | POA: Diagnosis not present

## 2021-07-04 DIAGNOSIS — K219 Gastro-esophageal reflux disease without esophagitis: Secondary | ICD-10-CM | POA: Diagnosis not present

## 2021-07-04 DIAGNOSIS — I10 Essential (primary) hypertension: Secondary | ICD-10-CM | POA: Diagnosis not present

## 2021-07-09 DIAGNOSIS — Z961 Presence of intraocular lens: Secondary | ICD-10-CM | POA: Diagnosis not present

## 2021-07-09 DIAGNOSIS — H16223 Keratoconjunctivitis sicca, not specified as Sjogren's, bilateral: Secondary | ICD-10-CM | POA: Diagnosis not present

## 2021-07-09 DIAGNOSIS — H4720 Unspecified optic atrophy: Secondary | ICD-10-CM | POA: Diagnosis not present

## 2021-07-09 DIAGNOSIS — G35 Multiple sclerosis: Secondary | ICD-10-CM | POA: Diagnosis not present

## 2021-07-18 ENCOUNTER — Telehealth: Payer: Self-pay | Admitting: *Deleted

## 2021-07-18 NOTE — Telephone Encounter (Signed)
Gave completed/signed personal care services form back to medical records to process for pt. ?

## 2021-07-22 ENCOUNTER — Other Ambulatory Visit: Payer: Medicare Other

## 2021-07-26 ENCOUNTER — Other Ambulatory Visit: Payer: Medicare Other

## 2021-07-29 ENCOUNTER — Other Ambulatory Visit: Payer: Self-pay | Admitting: Neurology

## 2021-07-29 DIAGNOSIS — R3915 Urgency of urination: Secondary | ICD-10-CM

## 2021-07-29 DIAGNOSIS — R350 Frequency of micturition: Secondary | ICD-10-CM

## 2021-08-03 ENCOUNTER — Other Ambulatory Visit: Payer: Medicare Other

## 2021-08-05 DIAGNOSIS — S76012A Strain of muscle, fascia and tendon of left hip, initial encounter: Secondary | ICD-10-CM | POA: Diagnosis not present

## 2021-08-05 DIAGNOSIS — I10 Essential (primary) hypertension: Secondary | ICD-10-CM | POA: Diagnosis not present

## 2021-08-05 DIAGNOSIS — S76011A Strain of muscle, fascia and tendon of right hip, initial encounter: Secondary | ICD-10-CM | POA: Diagnosis not present

## 2021-08-05 DIAGNOSIS — E7800 Pure hypercholesterolemia, unspecified: Secondary | ICD-10-CM | POA: Diagnosis not present

## 2021-08-05 DIAGNOSIS — S76311A Strain of muscle, fascia and tendon of the posterior muscle group at thigh level, right thigh, initial encounter: Secondary | ICD-10-CM | POA: Diagnosis not present

## 2021-08-05 DIAGNOSIS — K219 Gastro-esophageal reflux disease without esophagitis: Secondary | ICD-10-CM | POA: Diagnosis not present

## 2021-08-07 ENCOUNTER — Ambulatory Visit: Payer: Medicare Other | Admitting: Neurology

## 2021-08-09 ENCOUNTER — Other Ambulatory Visit: Payer: Self-pay | Admitting: Neurology

## 2021-08-09 MED ORDER — ALPRAZOLAM 1 MG PO TABS: 1.0000 mg | ORAL_TABLET | ORAL | 0 refills | Status: DC | PRN

## 2021-08-10 ENCOUNTER — Other Ambulatory Visit: Payer: Medicare Other

## 2021-08-16 DIAGNOSIS — K5730 Diverticulosis of large intestine without perforation or abscess without bleeding: Secondary | ICD-10-CM | POA: Diagnosis not present

## 2021-08-16 DIAGNOSIS — D120 Benign neoplasm of cecum: Secondary | ICD-10-CM | POA: Diagnosis not present

## 2021-08-16 DIAGNOSIS — Z09 Encounter for follow-up examination after completed treatment for conditions other than malignant neoplasm: Secondary | ICD-10-CM | POA: Diagnosis not present

## 2021-08-16 DIAGNOSIS — D123 Benign neoplasm of transverse colon: Secondary | ICD-10-CM | POA: Diagnosis not present

## 2021-08-16 DIAGNOSIS — K648 Other hemorrhoids: Secondary | ICD-10-CM | POA: Diagnosis not present

## 2021-08-16 DIAGNOSIS — Z86010 Personal history of colonic polyps: Secondary | ICD-10-CM | POA: Diagnosis not present

## 2021-08-16 DIAGNOSIS — D125 Benign neoplasm of sigmoid colon: Secondary | ICD-10-CM | POA: Diagnosis not present

## 2021-08-21 DIAGNOSIS — D125 Benign neoplasm of sigmoid colon: Secondary | ICD-10-CM | POA: Diagnosis not present

## 2021-08-25 DIAGNOSIS — S76011D Strain of muscle, fascia and tendon of right hip, subsequent encounter: Secondary | ICD-10-CM | POA: Diagnosis not present

## 2021-08-25 DIAGNOSIS — Z87891 Personal history of nicotine dependence: Secondary | ICD-10-CM | POA: Diagnosis not present

## 2021-08-25 DIAGNOSIS — F32A Depression, unspecified: Secondary | ICD-10-CM | POA: Diagnosis not present

## 2021-08-25 DIAGNOSIS — Z791 Long term (current) use of non-steroidal anti-inflammatories (NSAID): Secondary | ICD-10-CM | POA: Diagnosis not present

## 2021-08-25 DIAGNOSIS — M1990 Unspecified osteoarthritis, unspecified site: Secondary | ICD-10-CM | POA: Diagnosis not present

## 2021-08-25 DIAGNOSIS — K5790 Diverticulosis of intestine, part unspecified, without perforation or abscess without bleeding: Secondary | ICD-10-CM | POA: Diagnosis not present

## 2021-08-25 DIAGNOSIS — I350 Nonrheumatic aortic (valve) stenosis: Secondary | ICD-10-CM | POA: Diagnosis not present

## 2021-08-25 DIAGNOSIS — S76012D Strain of muscle, fascia and tendon of left hip, subsequent encounter: Secondary | ICD-10-CM | POA: Diagnosis not present

## 2021-08-25 DIAGNOSIS — S76311D Strain of muscle, fascia and tendon of the posterior muscle group at thigh level, right thigh, subsequent encounter: Secondary | ICD-10-CM | POA: Diagnosis not present

## 2021-08-25 DIAGNOSIS — Z7951 Long term (current) use of inhaled steroids: Secondary | ICD-10-CM | POA: Diagnosis not present

## 2021-08-25 DIAGNOSIS — H9190 Unspecified hearing loss, unspecified ear: Secondary | ICD-10-CM | POA: Diagnosis not present

## 2021-08-25 DIAGNOSIS — I119 Hypertensive heart disease without heart failure: Secondary | ICD-10-CM | POA: Diagnosis not present

## 2021-08-25 DIAGNOSIS — L409 Psoriasis, unspecified: Secondary | ICD-10-CM | POA: Diagnosis not present

## 2021-08-25 DIAGNOSIS — G4700 Insomnia, unspecified: Secondary | ICD-10-CM | POA: Diagnosis not present

## 2021-08-25 DIAGNOSIS — G35 Multiple sclerosis: Secondary | ICD-10-CM | POA: Diagnosis not present

## 2021-08-25 DIAGNOSIS — I341 Nonrheumatic mitral (valve) prolapse: Secondary | ICD-10-CM | POA: Diagnosis not present

## 2021-08-25 DIAGNOSIS — Z9181 History of falling: Secondary | ICD-10-CM | POA: Diagnosis not present

## 2021-08-25 DIAGNOSIS — E785 Hyperlipidemia, unspecified: Secondary | ICD-10-CM | POA: Diagnosis not present

## 2021-08-25 DIAGNOSIS — R413 Other amnesia: Secondary | ICD-10-CM | POA: Diagnosis not present

## 2021-08-26 ENCOUNTER — Telehealth: Payer: Self-pay | Admitting: Neurology

## 2021-08-26 NOTE — Telephone Encounter (Signed)
I called patient.  She reports that last weekend she was in the kitchen and her legs gave out.  She called EMS.  EMS came and helped her get off the floor.  EMS checked her vital signs and felt like she was fine to stay at home.  However, EMS personnel wrapped their arms around her upper body, around her chest, and lifted her up from the floor.  Since then, she reports that her chest is sore with activity.  Tylenol does not seem to help.  She called her primary care who recommended that she call our office to give [place] this update. She does not feel that this pain is MS related. The pain is improving.   While this pain does not sound cardiac related, I did advise her that chest pain ought to be emergently evaluated by ER to be on the safe side. Patient does not feel that this cardiac pain and declined ER or UC. She just wants to let Dr. Felecia Shelling know as an FYI and if he has any further recommendations to let her know.

## 2021-08-26 NOTE — Telephone Encounter (Signed)
Pt states last weekend someone had to get her off the floor and she has a lot of pain in her upper chest. Pt states she took tylenol which has not helped much.  Pt states she spoke with her PCP who suggested maybe the pain is having is related to her MS.  Phone rep asked if she went to urgent care or ED.  Pt has no one to take her to ED or urgent care and she doesn't really want to call 911 because the pain is not as bad as it was yesterday.  Pt is asking for a call to discuss this.

## 2021-08-31 ENCOUNTER — Ambulatory Visit
Admission: RE | Admit: 2021-08-31 | Discharge: 2021-08-31 | Disposition: A | Discharge status: Home / Self Care | Payer: Medicare Other | Source: Ambulatory Visit | Attending: Neurology | Admitting: Neurology

## 2021-08-31 DIAGNOSIS — R531 Weakness: Secondary | ICD-10-CM

## 2021-08-31 DIAGNOSIS — G35 Multiple sclerosis: Secondary | ICD-10-CM

## 2021-08-31 MED ORDER — GADOBENATE DIMEGLUMINE 529 MG/ML IV SOLN
15.0000 mL | Freq: Once | INTRAVENOUS | Status: AC | PRN
  Administered 2021-08-31: 15 mL via INTRAVENOUS

## 2021-09-02 DIAGNOSIS — S76012D Strain of muscle, fascia and tendon of left hip, subsequent encounter: Secondary | ICD-10-CM | POA: Diagnosis not present

## 2021-09-02 DIAGNOSIS — H9190 Unspecified hearing loss, unspecified ear: Secondary | ICD-10-CM | POA: Diagnosis not present

## 2021-09-02 DIAGNOSIS — G4700 Insomnia, unspecified: Secondary | ICD-10-CM | POA: Diagnosis not present

## 2021-09-02 DIAGNOSIS — S76311D Strain of muscle, fascia and tendon of the posterior muscle group at thigh level, right thigh, subsequent encounter: Secondary | ICD-10-CM | POA: Diagnosis not present

## 2021-09-02 DIAGNOSIS — I350 Nonrheumatic aortic (valve) stenosis: Secondary | ICD-10-CM | POA: Diagnosis not present

## 2021-09-02 DIAGNOSIS — R413 Other amnesia: Secondary | ICD-10-CM | POA: Diagnosis not present

## 2021-09-02 DIAGNOSIS — L409 Psoriasis, unspecified: Secondary | ICD-10-CM | POA: Diagnosis not present

## 2021-09-02 DIAGNOSIS — S76011D Strain of muscle, fascia and tendon of right hip, subsequent encounter: Secondary | ICD-10-CM | POA: Diagnosis not present

## 2021-09-02 DIAGNOSIS — I341 Nonrheumatic mitral (valve) prolapse: Secondary | ICD-10-CM | POA: Diagnosis not present

## 2021-09-02 DIAGNOSIS — Z87891 Personal history of nicotine dependence: Secondary | ICD-10-CM | POA: Diagnosis not present

## 2021-09-02 DIAGNOSIS — I119 Hypertensive heart disease without heart failure: Secondary | ICD-10-CM | POA: Diagnosis not present

## 2021-09-02 DIAGNOSIS — M1990 Unspecified osteoarthritis, unspecified site: Secondary | ICD-10-CM | POA: Diagnosis not present

## 2021-09-02 DIAGNOSIS — Z9181 History of falling: Secondary | ICD-10-CM | POA: Diagnosis not present

## 2021-09-02 DIAGNOSIS — E785 Hyperlipidemia, unspecified: Secondary | ICD-10-CM | POA: Diagnosis not present

## 2021-09-02 DIAGNOSIS — Z791 Long term (current) use of non-steroidal anti-inflammatories (NSAID): Secondary | ICD-10-CM | POA: Diagnosis not present

## 2021-09-02 DIAGNOSIS — F32A Depression, unspecified: Secondary | ICD-10-CM | POA: Diagnosis not present

## 2021-09-02 DIAGNOSIS — Z7951 Long term (current) use of inhaled steroids: Secondary | ICD-10-CM | POA: Diagnosis not present

## 2021-09-02 DIAGNOSIS — K5790 Diverticulosis of intestine, part unspecified, without perforation or abscess without bleeding: Secondary | ICD-10-CM | POA: Diagnosis not present

## 2021-09-02 DIAGNOSIS — G35 Multiple sclerosis: Secondary | ICD-10-CM | POA: Diagnosis not present

## 2021-09-05 DIAGNOSIS — H9190 Unspecified hearing loss, unspecified ear: Secondary | ICD-10-CM | POA: Diagnosis not present

## 2021-09-05 DIAGNOSIS — E785 Hyperlipidemia, unspecified: Secondary | ICD-10-CM | POA: Diagnosis not present

## 2021-09-05 DIAGNOSIS — M1990 Unspecified osteoarthritis, unspecified site: Secondary | ICD-10-CM | POA: Diagnosis not present

## 2021-09-05 DIAGNOSIS — S76012D Strain of muscle, fascia and tendon of left hip, subsequent encounter: Secondary | ICD-10-CM | POA: Diagnosis not present

## 2021-09-05 DIAGNOSIS — S76311D Strain of muscle, fascia and tendon of the posterior muscle group at thigh level, right thigh, subsequent encounter: Secondary | ICD-10-CM | POA: Diagnosis not present

## 2021-09-05 DIAGNOSIS — Z7951 Long term (current) use of inhaled steroids: Secondary | ICD-10-CM | POA: Diagnosis not present

## 2021-09-05 DIAGNOSIS — Z791 Long term (current) use of non-steroidal anti-inflammatories (NSAID): Secondary | ICD-10-CM | POA: Diagnosis not present

## 2021-09-05 DIAGNOSIS — R413 Other amnesia: Secondary | ICD-10-CM | POA: Diagnosis not present

## 2021-09-05 DIAGNOSIS — I341 Nonrheumatic mitral (valve) prolapse: Secondary | ICD-10-CM | POA: Diagnosis not present

## 2021-09-05 DIAGNOSIS — I350 Nonrheumatic aortic (valve) stenosis: Secondary | ICD-10-CM | POA: Diagnosis not present

## 2021-09-05 DIAGNOSIS — G4700 Insomnia, unspecified: Secondary | ICD-10-CM | POA: Diagnosis not present

## 2021-09-05 DIAGNOSIS — F32A Depression, unspecified: Secondary | ICD-10-CM | POA: Diagnosis not present

## 2021-09-05 DIAGNOSIS — G35 Multiple sclerosis: Secondary | ICD-10-CM | POA: Diagnosis not present

## 2021-09-05 DIAGNOSIS — Z9181 History of falling: Secondary | ICD-10-CM | POA: Diagnosis not present

## 2021-09-05 DIAGNOSIS — Z87891 Personal history of nicotine dependence: Secondary | ICD-10-CM | POA: Diagnosis not present

## 2021-09-05 DIAGNOSIS — S76011D Strain of muscle, fascia and tendon of right hip, subsequent encounter: Secondary | ICD-10-CM | POA: Diagnosis not present

## 2021-09-05 DIAGNOSIS — K5790 Diverticulosis of intestine, part unspecified, without perforation or abscess without bleeding: Secondary | ICD-10-CM | POA: Diagnosis not present

## 2021-09-05 DIAGNOSIS — I119 Hypertensive heart disease without heart failure: Secondary | ICD-10-CM | POA: Diagnosis not present

## 2021-09-05 DIAGNOSIS — L409 Psoriasis, unspecified: Secondary | ICD-10-CM | POA: Diagnosis not present

## 2021-09-09 DIAGNOSIS — S76311D Strain of muscle, fascia and tendon of the posterior muscle group at thigh level, right thigh, subsequent encounter: Secondary | ICD-10-CM | POA: Diagnosis not present

## 2021-09-09 DIAGNOSIS — E785 Hyperlipidemia, unspecified: Secondary | ICD-10-CM | POA: Diagnosis not present

## 2021-09-09 DIAGNOSIS — S76012D Strain of muscle, fascia and tendon of left hip, subsequent encounter: Secondary | ICD-10-CM | POA: Diagnosis not present

## 2021-09-09 DIAGNOSIS — I341 Nonrheumatic mitral (valve) prolapse: Secondary | ICD-10-CM | POA: Diagnosis not present

## 2021-09-09 DIAGNOSIS — S76011D Strain of muscle, fascia and tendon of right hip, subsequent encounter: Secondary | ICD-10-CM | POA: Diagnosis not present

## 2021-09-09 DIAGNOSIS — F32A Depression, unspecified: Secondary | ICD-10-CM | POA: Diagnosis not present

## 2021-09-09 DIAGNOSIS — I119 Hypertensive heart disease without heart failure: Secondary | ICD-10-CM | POA: Diagnosis not present

## 2021-09-09 DIAGNOSIS — R413 Other amnesia: Secondary | ICD-10-CM | POA: Diagnosis not present

## 2021-09-09 DIAGNOSIS — M1990 Unspecified osteoarthritis, unspecified site: Secondary | ICD-10-CM | POA: Diagnosis not present

## 2021-09-09 DIAGNOSIS — K5790 Diverticulosis of intestine, part unspecified, without perforation or abscess without bleeding: Secondary | ICD-10-CM | POA: Diagnosis not present

## 2021-09-09 DIAGNOSIS — Z791 Long term (current) use of non-steroidal anti-inflammatories (NSAID): Secondary | ICD-10-CM | POA: Diagnosis not present

## 2021-09-09 DIAGNOSIS — L409 Psoriasis, unspecified: Secondary | ICD-10-CM | POA: Diagnosis not present

## 2021-09-09 DIAGNOSIS — G35 Multiple sclerosis: Secondary | ICD-10-CM | POA: Diagnosis not present

## 2021-09-09 DIAGNOSIS — Z7951 Long term (current) use of inhaled steroids: Secondary | ICD-10-CM | POA: Diagnosis not present

## 2021-09-09 DIAGNOSIS — I350 Nonrheumatic aortic (valve) stenosis: Secondary | ICD-10-CM | POA: Diagnosis not present

## 2021-09-09 DIAGNOSIS — H9190 Unspecified hearing loss, unspecified ear: Secondary | ICD-10-CM | POA: Diagnosis not present

## 2021-09-09 DIAGNOSIS — Z9181 History of falling: Secondary | ICD-10-CM | POA: Diagnosis not present

## 2021-09-09 DIAGNOSIS — G4700 Insomnia, unspecified: Secondary | ICD-10-CM | POA: Diagnosis not present

## 2021-09-09 DIAGNOSIS — Z87891 Personal history of nicotine dependence: Secondary | ICD-10-CM | POA: Diagnosis not present

## 2021-09-12 ENCOUNTER — Telehealth: Payer: Self-pay | Admitting: Neurology

## 2021-09-12 NOTE — Telephone Encounter (Signed)
Pt asking to be called with MRI results as soon as they are available

## 2021-09-12 NOTE — Telephone Encounter (Addendum)
Reviewed pt chart. She completed MRI brain/cervical on 08/31/2021. Dr. Felecia Shelling sent her the following mychart message about results on 09/01/21: "The MRI of the brain and cervical spine look okay.  They show the old MS lesions but there were no new ones.  She also has disc degenerative changes and bone spurs at C5-C6 and C6-C7 in the neck.  C5-C6 is a little bit worse than it was in 2015 while C6-C7 looks about the same."  I called back and LVM relaying above results. Advised her to call back if she has any more question/concerns.

## 2021-09-13 DIAGNOSIS — I350 Nonrheumatic aortic (valve) stenosis: Secondary | ICD-10-CM | POA: Diagnosis not present

## 2021-09-13 DIAGNOSIS — I119 Hypertensive heart disease without heart failure: Secondary | ICD-10-CM | POA: Diagnosis not present

## 2021-09-13 DIAGNOSIS — I341 Nonrheumatic mitral (valve) prolapse: Secondary | ICD-10-CM | POA: Diagnosis not present

## 2021-09-13 DIAGNOSIS — S76012D Strain of muscle, fascia and tendon of left hip, subsequent encounter: Secondary | ICD-10-CM | POA: Diagnosis not present

## 2021-09-13 DIAGNOSIS — S76311D Strain of muscle, fascia and tendon of the posterior muscle group at thigh level, right thigh, subsequent encounter: Secondary | ICD-10-CM | POA: Diagnosis not present

## 2021-09-13 DIAGNOSIS — M1990 Unspecified osteoarthritis, unspecified site: Secondary | ICD-10-CM | POA: Diagnosis not present

## 2021-09-13 DIAGNOSIS — Z7951 Long term (current) use of inhaled steroids: Secondary | ICD-10-CM | POA: Diagnosis not present

## 2021-09-13 DIAGNOSIS — Z9181 History of falling: Secondary | ICD-10-CM | POA: Diagnosis not present

## 2021-09-13 DIAGNOSIS — G35 Multiple sclerosis: Secondary | ICD-10-CM | POA: Diagnosis not present

## 2021-09-13 DIAGNOSIS — Z87891 Personal history of nicotine dependence: Secondary | ICD-10-CM | POA: Diagnosis not present

## 2021-09-13 DIAGNOSIS — E785 Hyperlipidemia, unspecified: Secondary | ICD-10-CM | POA: Diagnosis not present

## 2021-09-13 DIAGNOSIS — R413 Other amnesia: Secondary | ICD-10-CM | POA: Diagnosis not present

## 2021-09-13 DIAGNOSIS — F32A Depression, unspecified: Secondary | ICD-10-CM | POA: Diagnosis not present

## 2021-09-13 DIAGNOSIS — S76011D Strain of muscle, fascia and tendon of right hip, subsequent encounter: Secondary | ICD-10-CM | POA: Diagnosis not present

## 2021-09-13 DIAGNOSIS — K5790 Diverticulosis of intestine, part unspecified, without perforation or abscess without bleeding: Secondary | ICD-10-CM | POA: Diagnosis not present

## 2021-09-13 DIAGNOSIS — L409 Psoriasis, unspecified: Secondary | ICD-10-CM | POA: Diagnosis not present

## 2021-09-13 DIAGNOSIS — H9190 Unspecified hearing loss, unspecified ear: Secondary | ICD-10-CM | POA: Diagnosis not present

## 2021-09-13 DIAGNOSIS — Z791 Long term (current) use of non-steroidal anti-inflammatories (NSAID): Secondary | ICD-10-CM | POA: Diagnosis not present

## 2021-09-13 DIAGNOSIS — G4700 Insomnia, unspecified: Secondary | ICD-10-CM | POA: Diagnosis not present

## 2021-09-19 DIAGNOSIS — F32A Depression, unspecified: Secondary | ICD-10-CM | POA: Diagnosis not present

## 2021-09-19 DIAGNOSIS — S76011D Strain of muscle, fascia and tendon of right hip, subsequent encounter: Secondary | ICD-10-CM | POA: Diagnosis not present

## 2021-09-19 DIAGNOSIS — Z87891 Personal history of nicotine dependence: Secondary | ICD-10-CM | POA: Diagnosis not present

## 2021-09-19 DIAGNOSIS — Z9181 History of falling: Secondary | ICD-10-CM | POA: Diagnosis not present

## 2021-09-19 DIAGNOSIS — K5790 Diverticulosis of intestine, part unspecified, without perforation or abscess without bleeding: Secondary | ICD-10-CM | POA: Diagnosis not present

## 2021-09-19 DIAGNOSIS — R413 Other amnesia: Secondary | ICD-10-CM | POA: Diagnosis not present

## 2021-09-19 DIAGNOSIS — Z7951 Long term (current) use of inhaled steroids: Secondary | ICD-10-CM | POA: Diagnosis not present

## 2021-09-19 DIAGNOSIS — G4700 Insomnia, unspecified: Secondary | ICD-10-CM | POA: Diagnosis not present

## 2021-09-19 DIAGNOSIS — I350 Nonrheumatic aortic (valve) stenosis: Secondary | ICD-10-CM | POA: Diagnosis not present

## 2021-09-19 DIAGNOSIS — H9190 Unspecified hearing loss, unspecified ear: Secondary | ICD-10-CM | POA: Diagnosis not present

## 2021-09-19 DIAGNOSIS — L409 Psoriasis, unspecified: Secondary | ICD-10-CM | POA: Diagnosis not present

## 2021-09-19 DIAGNOSIS — I341 Nonrheumatic mitral (valve) prolapse: Secondary | ICD-10-CM | POA: Diagnosis not present

## 2021-09-19 DIAGNOSIS — G35 Multiple sclerosis: Secondary | ICD-10-CM | POA: Diagnosis not present

## 2021-09-19 DIAGNOSIS — E785 Hyperlipidemia, unspecified: Secondary | ICD-10-CM | POA: Diagnosis not present

## 2021-09-19 DIAGNOSIS — M1990 Unspecified osteoarthritis, unspecified site: Secondary | ICD-10-CM | POA: Diagnosis not present

## 2021-09-19 DIAGNOSIS — I119 Hypertensive heart disease without heart failure: Secondary | ICD-10-CM | POA: Diagnosis not present

## 2021-09-19 DIAGNOSIS — Z791 Long term (current) use of non-steroidal anti-inflammatories (NSAID): Secondary | ICD-10-CM | POA: Diagnosis not present

## 2021-09-19 DIAGNOSIS — S76012D Strain of muscle, fascia and tendon of left hip, subsequent encounter: Secondary | ICD-10-CM | POA: Diagnosis not present

## 2021-09-19 DIAGNOSIS — S76311D Strain of muscle, fascia and tendon of the posterior muscle group at thigh level, right thigh, subsequent encounter: Secondary | ICD-10-CM | POA: Diagnosis not present

## 2021-09-30 ENCOUNTER — Other Ambulatory Visit: Payer: Self-pay | Admitting: Neurology

## 2021-09-30 DIAGNOSIS — F32A Depression, unspecified: Secondary | ICD-10-CM | POA: Diagnosis not present

## 2021-09-30 DIAGNOSIS — G4700 Insomnia, unspecified: Secondary | ICD-10-CM | POA: Diagnosis not present

## 2021-09-30 DIAGNOSIS — S76311D Strain of muscle, fascia and tendon of the posterior muscle group at thigh level, right thigh, subsequent encounter: Secondary | ICD-10-CM | POA: Diagnosis not present

## 2021-09-30 DIAGNOSIS — K5790 Diverticulosis of intestine, part unspecified, without perforation or abscess without bleeding: Secondary | ICD-10-CM | POA: Diagnosis not present

## 2021-09-30 DIAGNOSIS — I350 Nonrheumatic aortic (valve) stenosis: Secondary | ICD-10-CM | POA: Diagnosis not present

## 2021-09-30 DIAGNOSIS — M1990 Unspecified osteoarthritis, unspecified site: Secondary | ICD-10-CM | POA: Diagnosis not present

## 2021-09-30 DIAGNOSIS — Z87891 Personal history of nicotine dependence: Secondary | ICD-10-CM | POA: Diagnosis not present

## 2021-09-30 DIAGNOSIS — S76012D Strain of muscle, fascia and tendon of left hip, subsequent encounter: Secondary | ICD-10-CM | POA: Diagnosis not present

## 2021-09-30 DIAGNOSIS — Z7951 Long term (current) use of inhaled steroids: Secondary | ICD-10-CM | POA: Diagnosis not present

## 2021-09-30 DIAGNOSIS — S76011D Strain of muscle, fascia and tendon of right hip, subsequent encounter: Secondary | ICD-10-CM | POA: Diagnosis not present

## 2021-09-30 DIAGNOSIS — I341 Nonrheumatic mitral (valve) prolapse: Secondary | ICD-10-CM | POA: Diagnosis not present

## 2021-09-30 DIAGNOSIS — Z9181 History of falling: Secondary | ICD-10-CM | POA: Diagnosis not present

## 2021-09-30 DIAGNOSIS — H9190 Unspecified hearing loss, unspecified ear: Secondary | ICD-10-CM | POA: Diagnosis not present

## 2021-09-30 DIAGNOSIS — L409 Psoriasis, unspecified: Secondary | ICD-10-CM | POA: Diagnosis not present

## 2021-09-30 DIAGNOSIS — I119 Hypertensive heart disease without heart failure: Secondary | ICD-10-CM | POA: Diagnosis not present

## 2021-09-30 DIAGNOSIS — E785 Hyperlipidemia, unspecified: Secondary | ICD-10-CM | POA: Diagnosis not present

## 2021-09-30 DIAGNOSIS — G35 Multiple sclerosis: Secondary | ICD-10-CM | POA: Diagnosis not present

## 2021-09-30 DIAGNOSIS — R413 Other amnesia: Secondary | ICD-10-CM | POA: Diagnosis not present

## 2021-09-30 DIAGNOSIS — Z791 Long term (current) use of non-steroidal anti-inflammatories (NSAID): Secondary | ICD-10-CM | POA: Diagnosis not present

## 2021-10-02 ENCOUNTER — Other Ambulatory Visit: Payer: Self-pay | Admitting: Neurology

## 2021-10-07 ENCOUNTER — Telehealth: Payer: Self-pay | Admitting: Neurology

## 2021-10-07 DIAGNOSIS — S76311D Strain of muscle, fascia and tendon of the posterior muscle group at thigh level, right thigh, subsequent encounter: Secondary | ICD-10-CM | POA: Diagnosis not present

## 2021-10-07 DIAGNOSIS — S76011D Strain of muscle, fascia and tendon of right hip, subsequent encounter: Secondary | ICD-10-CM | POA: Diagnosis not present

## 2021-10-07 DIAGNOSIS — I341 Nonrheumatic mitral (valve) prolapse: Secondary | ICD-10-CM | POA: Diagnosis not present

## 2021-10-07 DIAGNOSIS — M1990 Unspecified osteoarthritis, unspecified site: Secondary | ICD-10-CM | POA: Diagnosis not present

## 2021-10-07 DIAGNOSIS — I119 Hypertensive heart disease without heart failure: Secondary | ICD-10-CM | POA: Diagnosis not present

## 2021-10-07 DIAGNOSIS — F32A Depression, unspecified: Secondary | ICD-10-CM | POA: Diagnosis not present

## 2021-10-07 DIAGNOSIS — E785 Hyperlipidemia, unspecified: Secondary | ICD-10-CM | POA: Diagnosis not present

## 2021-10-07 DIAGNOSIS — I350 Nonrheumatic aortic (valve) stenosis: Secondary | ICD-10-CM | POA: Diagnosis not present

## 2021-10-07 DIAGNOSIS — Z9181 History of falling: Secondary | ICD-10-CM | POA: Diagnosis not present

## 2021-10-07 DIAGNOSIS — L409 Psoriasis, unspecified: Secondary | ICD-10-CM | POA: Diagnosis not present

## 2021-10-07 DIAGNOSIS — G4700 Insomnia, unspecified: Secondary | ICD-10-CM | POA: Diagnosis not present

## 2021-10-07 DIAGNOSIS — R413 Other amnesia: Secondary | ICD-10-CM | POA: Diagnosis not present

## 2021-10-07 DIAGNOSIS — Z87891 Personal history of nicotine dependence: Secondary | ICD-10-CM | POA: Diagnosis not present

## 2021-10-07 DIAGNOSIS — K5790 Diverticulosis of intestine, part unspecified, without perforation or abscess without bleeding: Secondary | ICD-10-CM | POA: Diagnosis not present

## 2021-10-07 DIAGNOSIS — G35 Multiple sclerosis: Secondary | ICD-10-CM | POA: Diagnosis not present

## 2021-10-07 DIAGNOSIS — Z7951 Long term (current) use of inhaled steroids: Secondary | ICD-10-CM | POA: Diagnosis not present

## 2021-10-07 DIAGNOSIS — Z791 Long term (current) use of non-steroidal anti-inflammatories (NSAID): Secondary | ICD-10-CM | POA: Diagnosis not present

## 2021-10-07 DIAGNOSIS — H9190 Unspecified hearing loss, unspecified ear: Secondary | ICD-10-CM | POA: Diagnosis not present

## 2021-10-07 DIAGNOSIS — S76012D Strain of muscle, fascia and tendon of left hip, subsequent encounter: Secondary | ICD-10-CM | POA: Diagnosis not present

## 2021-10-07 NOTE — Telephone Encounter (Signed)
Called the patient back. She states some nights she can sleep just fine but the last couple nights she has had increase in the RLS symptoms. We reviewed the medications that are prescribed. Pt is taking the requip as ordered 1 tablet in the am, 1 in the evening and 2 at bedtime. When I asked about her gabapentin, it is prescribed 100 mg TID. Pt states she has only been taking that 1 time at bedtime. She thinks it is because when she woke up she felt to groggy. Advised she could try taking one capsule earlier in the evening and the one at bedtime to see if adding that additional capsule in the daily routine may help. She verbalized understanding. Advised her to contact the pharmacy to advise of this since we already have it ordered tid we shouldn't have to. Pt verbalized understanding. Pt had no questions at this time but was encouraged to call back if questions arise.

## 2021-10-07 NOTE — Telephone Encounter (Signed)
Pt said, have not slept the last couple of nights, because of restless leg. Would like a call from the nurse.

## 2021-10-14 DIAGNOSIS — I10 Essential (primary) hypertension: Secondary | ICD-10-CM | POA: Diagnosis not present

## 2021-10-14 DIAGNOSIS — K219 Gastro-esophageal reflux disease without esophagitis: Secondary | ICD-10-CM | POA: Diagnosis not present

## 2021-10-14 DIAGNOSIS — G35 Multiple sclerosis: Secondary | ICD-10-CM | POA: Diagnosis not present

## 2021-10-14 DIAGNOSIS — F5101 Primary insomnia: Secondary | ICD-10-CM | POA: Diagnosis not present

## 2021-10-14 DIAGNOSIS — E7800 Pure hypercholesterolemia, unspecified: Secondary | ICD-10-CM | POA: Diagnosis not present

## 2021-10-15 ENCOUNTER — Ambulatory Visit: Payer: Medicare Other | Admitting: Family Medicine

## 2021-10-16 DIAGNOSIS — K5790 Diverticulosis of intestine, part unspecified, without perforation or abscess without bleeding: Secondary | ICD-10-CM | POA: Diagnosis not present

## 2021-10-16 DIAGNOSIS — I350 Nonrheumatic aortic (valve) stenosis: Secondary | ICD-10-CM | POA: Diagnosis not present

## 2021-10-16 DIAGNOSIS — L409 Psoriasis, unspecified: Secondary | ICD-10-CM | POA: Diagnosis not present

## 2021-10-16 DIAGNOSIS — M1990 Unspecified osteoarthritis, unspecified site: Secondary | ICD-10-CM | POA: Diagnosis not present

## 2021-10-16 DIAGNOSIS — G4700 Insomnia, unspecified: Secondary | ICD-10-CM | POA: Diagnosis not present

## 2021-10-16 DIAGNOSIS — E785 Hyperlipidemia, unspecified: Secondary | ICD-10-CM | POA: Diagnosis not present

## 2021-10-16 DIAGNOSIS — S76012D Strain of muscle, fascia and tendon of left hip, subsequent encounter: Secondary | ICD-10-CM | POA: Diagnosis not present

## 2021-10-16 DIAGNOSIS — F32A Depression, unspecified: Secondary | ICD-10-CM | POA: Diagnosis not present

## 2021-10-16 DIAGNOSIS — Z87891 Personal history of nicotine dependence: Secondary | ICD-10-CM | POA: Diagnosis not present

## 2021-10-16 DIAGNOSIS — S76311D Strain of muscle, fascia and tendon of the posterior muscle group at thigh level, right thigh, subsequent encounter: Secondary | ICD-10-CM | POA: Diagnosis not present

## 2021-10-16 DIAGNOSIS — Z7951 Long term (current) use of inhaled steroids: Secondary | ICD-10-CM | POA: Diagnosis not present

## 2021-10-16 DIAGNOSIS — Z9181 History of falling: Secondary | ICD-10-CM | POA: Diagnosis not present

## 2021-10-16 DIAGNOSIS — I119 Hypertensive heart disease without heart failure: Secondary | ICD-10-CM | POA: Diagnosis not present

## 2021-10-16 DIAGNOSIS — I341 Nonrheumatic mitral (valve) prolapse: Secondary | ICD-10-CM | POA: Diagnosis not present

## 2021-10-16 DIAGNOSIS — S76011D Strain of muscle, fascia and tendon of right hip, subsequent encounter: Secondary | ICD-10-CM | POA: Diagnosis not present

## 2021-10-16 DIAGNOSIS — G35 Multiple sclerosis: Secondary | ICD-10-CM | POA: Diagnosis not present

## 2021-10-16 DIAGNOSIS — H9190 Unspecified hearing loss, unspecified ear: Secondary | ICD-10-CM | POA: Diagnosis not present

## 2021-10-16 DIAGNOSIS — R413 Other amnesia: Secondary | ICD-10-CM | POA: Diagnosis not present

## 2021-10-16 DIAGNOSIS — Z791 Long term (current) use of non-steroidal anti-inflammatories (NSAID): Secondary | ICD-10-CM | POA: Diagnosis not present

## 2021-10-21 ENCOUNTER — Telehealth: Payer: Self-pay | Admitting: Neurology

## 2021-10-21 NOTE — Telephone Encounter (Signed)
Called pt back. Sx started a few weeks ago. Has not done anything outside her normal routine before sx started.  Had same sx when first dx w/ MS years ago. Numbness is constant. Having trouble holding items, dropping items.  Taking gabapentin '100mg'$  po TID.  Has not started any other new meds . No infection/illness currently. Aware I will speak with MD and call her back. She is currently not on DMT for MS.  Having increased pain in hips. Has appt with [facility] 10/30/21.    Note from Dr. Felecia Shelling re: MRI brain/cervical completed on 08/31/21: "The MRI of the brain and cervical spine look okay.  They show the old MS lesions but there were no new ones.  He also have disc degenerative changes and bone spurs at C5-C6 and C6-C7 in the neck.  C5-C6 is a little bit worse than it was in 2015 while C6-C7 looks about the same."

## 2021-10-21 NOTE — Telephone Encounter (Signed)
Pt having numbness in fingers for several weeks. Would like a call from the nurse to discuss if related to my MS.

## 2021-10-21 NOTE — Telephone Encounter (Signed)
Called pt back. Advised that Dr. Felecia Shelling said sx unlikely MS since MRI's last month showed no new lesions. She should continue to monitor. She could also discuss with Ortho at upcoming appt. May be able to offer EMG/NCS if warranted. She verbalized understanding.   Confirmed she is taking requip 0.'5mg'$ , 1 in the am, 1 in the evening and 2 at bedtime. She is insure if she is taking baclofen '10mg'$ . Dr. Felecia Shelling wrote for her to take 1 po TID but appears she reports she takes 1 po BID. She will double check when she gets home to make sure she is taking. If not, she will restart to see if this helps.

## 2021-10-22 ENCOUNTER — Telehealth: Payer: Self-pay | Admitting: Neurology

## 2021-10-22 DIAGNOSIS — F32A Depression, unspecified: Secondary | ICD-10-CM | POA: Diagnosis not present

## 2021-10-22 DIAGNOSIS — G4700 Insomnia, unspecified: Secondary | ICD-10-CM | POA: Diagnosis not present

## 2021-10-22 DIAGNOSIS — K5790 Diverticulosis of intestine, part unspecified, without perforation or abscess without bleeding: Secondary | ICD-10-CM | POA: Diagnosis not present

## 2021-10-22 DIAGNOSIS — R413 Other amnesia: Secondary | ICD-10-CM | POA: Diagnosis not present

## 2021-10-22 DIAGNOSIS — I119 Hypertensive heart disease without heart failure: Secondary | ICD-10-CM | POA: Diagnosis not present

## 2021-10-22 DIAGNOSIS — E785 Hyperlipidemia, unspecified: Secondary | ICD-10-CM | POA: Diagnosis not present

## 2021-10-22 DIAGNOSIS — Z791 Long term (current) use of non-steroidal anti-inflammatories (NSAID): Secondary | ICD-10-CM | POA: Diagnosis not present

## 2021-10-22 DIAGNOSIS — I350 Nonrheumatic aortic (valve) stenosis: Secondary | ICD-10-CM | POA: Diagnosis not present

## 2021-10-22 DIAGNOSIS — M1990 Unspecified osteoarthritis, unspecified site: Secondary | ICD-10-CM | POA: Diagnosis not present

## 2021-10-22 DIAGNOSIS — Z9181 History of falling: Secondary | ICD-10-CM | POA: Diagnosis not present

## 2021-10-22 DIAGNOSIS — I341 Nonrheumatic mitral (valve) prolapse: Secondary | ICD-10-CM | POA: Diagnosis not present

## 2021-10-22 DIAGNOSIS — S76012D Strain of muscle, fascia and tendon of left hip, subsequent encounter: Secondary | ICD-10-CM | POA: Diagnosis not present

## 2021-10-22 DIAGNOSIS — Z87891 Personal history of nicotine dependence: Secondary | ICD-10-CM | POA: Diagnosis not present

## 2021-10-22 DIAGNOSIS — Z7951 Long term (current) use of inhaled steroids: Secondary | ICD-10-CM | POA: Diagnosis not present

## 2021-10-22 DIAGNOSIS — L409 Psoriasis, unspecified: Secondary | ICD-10-CM | POA: Diagnosis not present

## 2021-10-22 DIAGNOSIS — H9190 Unspecified hearing loss, unspecified ear: Secondary | ICD-10-CM | POA: Diagnosis not present

## 2021-10-22 DIAGNOSIS — G35 Multiple sclerosis: Secondary | ICD-10-CM | POA: Diagnosis not present

## 2021-10-22 DIAGNOSIS — S76011D Strain of muscle, fascia and tendon of right hip, subsequent encounter: Secondary | ICD-10-CM | POA: Diagnosis not present

## 2021-10-22 DIAGNOSIS — S76311D Strain of muscle, fascia and tendon of the posterior muscle group at thigh level, right thigh, subsequent encounter: Secondary | ICD-10-CM | POA: Diagnosis not present

## 2021-10-22 NOTE — Telephone Encounter (Signed)
[name], can you f/u with pt?  I see where back on 07/18/21 we documented: "Gave completed/signed personal care services form back to medical records to process for pt."

## 2021-10-22 NOTE — Telephone Encounter (Signed)
Pt is calling and wants to know if the Provider have received any forms from her social worker. Pt said its been several weeks and she needs someone to give her a call to follow-up.

## 2021-10-23 NOTE — Telephone Encounter (Signed)
Pt request assessment form faxed  2 time to [facility] @ [phone]. I will re fax to [place] on 10/22/21 and I will update the patient.

## 2021-10-24 DIAGNOSIS — S76011D Strain of muscle, fascia and tendon of right hip, subsequent encounter: Secondary | ICD-10-CM | POA: Diagnosis not present

## 2021-10-24 DIAGNOSIS — L409 Psoriasis, unspecified: Secondary | ICD-10-CM | POA: Diagnosis not present

## 2021-10-24 DIAGNOSIS — K5790 Diverticulosis of intestine, part unspecified, without perforation or abscess without bleeding: Secondary | ICD-10-CM | POA: Diagnosis not present

## 2021-10-24 DIAGNOSIS — S76311D Strain of muscle, fascia and tendon of the posterior muscle group at thigh level, right thigh, subsequent encounter: Secondary | ICD-10-CM | POA: Diagnosis not present

## 2021-10-24 DIAGNOSIS — I119 Hypertensive heart disease without heart failure: Secondary | ICD-10-CM | POA: Diagnosis not present

## 2021-10-24 DIAGNOSIS — I350 Nonrheumatic aortic (valve) stenosis: Secondary | ICD-10-CM | POA: Diagnosis not present

## 2021-10-24 DIAGNOSIS — S76012D Strain of muscle, fascia and tendon of left hip, subsequent encounter: Secondary | ICD-10-CM | POA: Diagnosis not present

## 2021-10-24 DIAGNOSIS — H9190 Unspecified hearing loss, unspecified ear: Secondary | ICD-10-CM | POA: Diagnosis not present

## 2021-10-24 DIAGNOSIS — G35 Multiple sclerosis: Secondary | ICD-10-CM | POA: Diagnosis not present

## 2021-10-24 DIAGNOSIS — E785 Hyperlipidemia, unspecified: Secondary | ICD-10-CM | POA: Diagnosis not present

## 2021-10-24 DIAGNOSIS — I341 Nonrheumatic mitral (valve) prolapse: Secondary | ICD-10-CM | POA: Diagnosis not present

## 2021-10-24 DIAGNOSIS — M1990 Unspecified osteoarthritis, unspecified site: Secondary | ICD-10-CM | POA: Diagnosis not present

## 2021-10-29 ENCOUNTER — Other Ambulatory Visit: Payer: Self-pay | Admitting: Neurology

## 2021-11-01 DIAGNOSIS — I350 Nonrheumatic aortic (valve) stenosis: Secondary | ICD-10-CM | POA: Diagnosis not present

## 2021-11-01 DIAGNOSIS — L409 Psoriasis, unspecified: Secondary | ICD-10-CM | POA: Diagnosis not present

## 2021-11-01 DIAGNOSIS — R413 Other amnesia: Secondary | ICD-10-CM | POA: Diagnosis not present

## 2021-11-01 DIAGNOSIS — Z79899 Other long term (current) drug therapy: Secondary | ICD-10-CM | POA: Diagnosis not present

## 2021-11-01 DIAGNOSIS — G35 Multiple sclerosis: Secondary | ICD-10-CM | POA: Diagnosis not present

## 2021-11-01 DIAGNOSIS — Z9181 History of falling: Secondary | ICD-10-CM | POA: Diagnosis not present

## 2021-11-01 DIAGNOSIS — S76311D Strain of muscle, fascia and tendon of the posterior muscle group at thigh level, right thigh, subsequent encounter: Secondary | ICD-10-CM | POA: Diagnosis not present

## 2021-11-01 DIAGNOSIS — F32A Depression, unspecified: Secondary | ICD-10-CM | POA: Diagnosis not present

## 2021-11-01 DIAGNOSIS — M1990 Unspecified osteoarthritis, unspecified site: Secondary | ICD-10-CM | POA: Diagnosis not present

## 2021-11-01 DIAGNOSIS — G4700 Insomnia, unspecified: Secondary | ICD-10-CM | POA: Diagnosis not present

## 2021-11-01 DIAGNOSIS — S76011D Strain of muscle, fascia and tendon of right hip, subsequent encounter: Secondary | ICD-10-CM | POA: Diagnosis not present

## 2021-11-01 DIAGNOSIS — I119 Hypertensive heart disease without heart failure: Secondary | ICD-10-CM | POA: Diagnosis not present

## 2021-11-01 DIAGNOSIS — E785 Hyperlipidemia, unspecified: Secondary | ICD-10-CM | POA: Diagnosis not present

## 2021-11-01 DIAGNOSIS — S76012D Strain of muscle, fascia and tendon of left hip, subsequent encounter: Secondary | ICD-10-CM | POA: Diagnosis not present

## 2021-11-01 DIAGNOSIS — I341 Nonrheumatic mitral (valve) prolapse: Secondary | ICD-10-CM | POA: Diagnosis not present

## 2021-11-01 DIAGNOSIS — Z791 Long term (current) use of non-steroidal anti-inflammatories (NSAID): Secondary | ICD-10-CM | POA: Diagnosis not present

## 2021-11-01 DIAGNOSIS — Z87891 Personal history of nicotine dependence: Secondary | ICD-10-CM | POA: Diagnosis not present

## 2021-11-01 DIAGNOSIS — K5790 Diverticulosis of intestine, part unspecified, without perforation or abscess without bleeding: Secondary | ICD-10-CM | POA: Diagnosis not present

## 2021-11-01 DIAGNOSIS — H9190 Unspecified hearing loss, unspecified ear: Secondary | ICD-10-CM | POA: Diagnosis not present

## 2021-11-07 DIAGNOSIS — F32A Depression, unspecified: Secondary | ICD-10-CM | POA: Diagnosis not present

## 2021-11-07 DIAGNOSIS — G4700 Insomnia, unspecified: Secondary | ICD-10-CM | POA: Diagnosis not present

## 2021-11-07 DIAGNOSIS — Z79899 Other long term (current) drug therapy: Secondary | ICD-10-CM | POA: Diagnosis not present

## 2021-11-07 DIAGNOSIS — Z791 Long term (current) use of non-steroidal anti-inflammatories (NSAID): Secondary | ICD-10-CM | POA: Diagnosis not present

## 2021-11-07 DIAGNOSIS — M1990 Unspecified osteoarthritis, unspecified site: Secondary | ICD-10-CM | POA: Diagnosis not present

## 2021-11-07 DIAGNOSIS — S76011D Strain of muscle, fascia and tendon of right hip, subsequent encounter: Secondary | ICD-10-CM | POA: Diagnosis not present

## 2021-11-07 DIAGNOSIS — I119 Hypertensive heart disease without heart failure: Secondary | ICD-10-CM | POA: Diagnosis not present

## 2021-11-07 DIAGNOSIS — G35 Multiple sclerosis: Secondary | ICD-10-CM | POA: Diagnosis not present

## 2021-11-07 DIAGNOSIS — I350 Nonrheumatic aortic (valve) stenosis: Secondary | ICD-10-CM | POA: Diagnosis not present

## 2021-11-07 DIAGNOSIS — S76012D Strain of muscle, fascia and tendon of left hip, subsequent encounter: Secondary | ICD-10-CM | POA: Diagnosis not present

## 2021-11-07 DIAGNOSIS — Z9181 History of falling: Secondary | ICD-10-CM | POA: Diagnosis not present

## 2021-11-07 DIAGNOSIS — E785 Hyperlipidemia, unspecified: Secondary | ICD-10-CM | POA: Diagnosis not present

## 2021-11-07 DIAGNOSIS — L409 Psoriasis, unspecified: Secondary | ICD-10-CM | POA: Diagnosis not present

## 2021-11-07 DIAGNOSIS — R413 Other amnesia: Secondary | ICD-10-CM | POA: Diagnosis not present

## 2021-11-07 DIAGNOSIS — H9190 Unspecified hearing loss, unspecified ear: Secondary | ICD-10-CM | POA: Diagnosis not present

## 2021-11-07 DIAGNOSIS — S76311D Strain of muscle, fascia and tendon of the posterior muscle group at thigh level, right thigh, subsequent encounter: Secondary | ICD-10-CM | POA: Diagnosis not present

## 2021-11-07 DIAGNOSIS — K5790 Diverticulosis of intestine, part unspecified, without perforation or abscess without bleeding: Secondary | ICD-10-CM | POA: Diagnosis not present

## 2021-11-07 DIAGNOSIS — Z87891 Personal history of nicotine dependence: Secondary | ICD-10-CM | POA: Diagnosis not present

## 2021-11-07 DIAGNOSIS — I341 Nonrheumatic mitral (valve) prolapse: Secondary | ICD-10-CM | POA: Diagnosis not present

## 2021-11-08 ENCOUNTER — Other Ambulatory Visit: Payer: Self-pay | Admitting: Neurology

## 2021-11-11 ENCOUNTER — Other Ambulatory Visit: Payer: Self-pay | Admitting: Neurology

## 2021-11-11 MED ORDER — BACLOFEN 10 MG PO TABS: 10.0000 mg | ORAL_TABLET | Freq: Three times a day (TID) | ORAL | 11 refills | Status: DC

## 2021-11-11 NOTE — Telephone Encounter (Signed)
Pt states for about a month now she feels that her hands are going to sleep, pt asking if this has to do with her MS, please call.

## 2021-11-11 NOTE — Telephone Encounter (Signed)
Pt asked to be called to have it confirmed that enough medication would be called in for her to take the Baclofen 3 times a day.  Phone rep confirmed with Myriam Jacobson, RN that yes 90 tablets for 30 days will be called in.  Pt had no other questions or concerns.  This is FYI for POD1

## 2021-11-11 NOTE — Telephone Encounter (Signed)
Called the patient. There was no answer. Left a detailed message advising that appears that she had called about 3 weeks on these concerns reminded in the message that Dr Felecia Shelling doesn't feel this is related to the MS.  At that time when Terrence Dupont spoke with her the patient had a upcoming orthropedic apt and recommended that she discuss with orthropedic in case they would like to order EMG testing (too look for carpal tunnel) She wanted her to also confirm how she is taking the baclofen. Advised for her to make sure he is doing these things and encouraged the patient to call back if she had questions.   ** If patient calls back. Can you ask if she has had her orthopedic apt yet? If not, please advise she should wait until that appt and discuss with them.  Refresh her memory that Dr Felecia Shelling doesn't feel this is MS related. Can also ask if she has increased the baclofen to three times a day vs the two times a day that was reported.  If not she should try that too. Please reiterate that Dr Felecia Shelling doesn't feel this is MS related.

## 2021-11-11 NOTE — Telephone Encounter (Signed)
Pt has  only been taking Baclofen when she thinks about it. Informed pt of previous message. Pt verbalized understand.

## 2021-11-12 DIAGNOSIS — Z791 Long term (current) use of non-steroidal anti-inflammatories (NSAID): Secondary | ICD-10-CM | POA: Diagnosis not present

## 2021-11-12 DIAGNOSIS — Z79899 Other long term (current) drug therapy: Secondary | ICD-10-CM | POA: Diagnosis not present

## 2021-11-12 DIAGNOSIS — E785 Hyperlipidemia, unspecified: Secondary | ICD-10-CM | POA: Diagnosis not present

## 2021-11-12 DIAGNOSIS — H9190 Unspecified hearing loss, unspecified ear: Secondary | ICD-10-CM | POA: Diagnosis not present

## 2021-11-12 DIAGNOSIS — Z87891 Personal history of nicotine dependence: Secondary | ICD-10-CM | POA: Diagnosis not present

## 2021-11-12 DIAGNOSIS — F32A Depression, unspecified: Secondary | ICD-10-CM | POA: Diagnosis not present

## 2021-11-12 DIAGNOSIS — S76012D Strain of muscle, fascia and tendon of left hip, subsequent encounter: Secondary | ICD-10-CM | POA: Diagnosis not present

## 2021-11-12 DIAGNOSIS — Z9181 History of falling: Secondary | ICD-10-CM | POA: Diagnosis not present

## 2021-11-12 DIAGNOSIS — G35 Multiple sclerosis: Secondary | ICD-10-CM | POA: Diagnosis not present

## 2021-11-12 DIAGNOSIS — G4700 Insomnia, unspecified: Secondary | ICD-10-CM | POA: Diagnosis not present

## 2021-11-12 DIAGNOSIS — M1990 Unspecified osteoarthritis, unspecified site: Secondary | ICD-10-CM | POA: Diagnosis not present

## 2021-11-12 DIAGNOSIS — R413 Other amnesia: Secondary | ICD-10-CM | POA: Diagnosis not present

## 2021-11-12 DIAGNOSIS — L409 Psoriasis, unspecified: Secondary | ICD-10-CM | POA: Diagnosis not present

## 2021-11-12 DIAGNOSIS — K5790 Diverticulosis of intestine, part unspecified, without perforation or abscess without bleeding: Secondary | ICD-10-CM | POA: Diagnosis not present

## 2021-11-12 DIAGNOSIS — S76011D Strain of muscle, fascia and tendon of right hip, subsequent encounter: Secondary | ICD-10-CM | POA: Diagnosis not present

## 2021-11-12 DIAGNOSIS — S76311D Strain of muscle, fascia and tendon of the posterior muscle group at thigh level, right thigh, subsequent encounter: Secondary | ICD-10-CM | POA: Diagnosis not present

## 2021-11-12 DIAGNOSIS — I341 Nonrheumatic mitral (valve) prolapse: Secondary | ICD-10-CM | POA: Diagnosis not present

## 2021-11-12 DIAGNOSIS — I119 Hypertensive heart disease without heart failure: Secondary | ICD-10-CM | POA: Diagnosis not present

## 2021-11-12 DIAGNOSIS — I350 Nonrheumatic aortic (valve) stenosis: Secondary | ICD-10-CM | POA: Diagnosis not present

## 2021-11-18 ENCOUNTER — Telehealth: Payer: Self-pay | Admitting: *Deleted

## 2021-11-18 ENCOUNTER — Other Ambulatory Visit: Payer: Self-pay | Admitting: Family Medicine

## 2021-11-18 DIAGNOSIS — Z9181 History of falling: Secondary | ICD-10-CM | POA: Diagnosis not present

## 2021-11-18 DIAGNOSIS — M1990 Unspecified osteoarthritis, unspecified site: Secondary | ICD-10-CM | POA: Diagnosis not present

## 2021-11-18 DIAGNOSIS — S76011D Strain of muscle, fascia and tendon of right hip, subsequent encounter: Secondary | ICD-10-CM | POA: Diagnosis not present

## 2021-11-18 DIAGNOSIS — I119 Hypertensive heart disease without heart failure: Secondary | ICD-10-CM | POA: Diagnosis not present

## 2021-11-18 DIAGNOSIS — S76311D Strain of muscle, fascia and tendon of the posterior muscle group at thigh level, right thigh, subsequent encounter: Secondary | ICD-10-CM | POA: Diagnosis not present

## 2021-11-18 DIAGNOSIS — L409 Psoriasis, unspecified: Secondary | ICD-10-CM | POA: Diagnosis not present

## 2021-11-18 DIAGNOSIS — R413 Other amnesia: Secondary | ICD-10-CM | POA: Diagnosis not present

## 2021-11-18 DIAGNOSIS — Z87891 Personal history of nicotine dependence: Secondary | ICD-10-CM | POA: Diagnosis not present

## 2021-11-18 DIAGNOSIS — E785 Hyperlipidemia, unspecified: Secondary | ICD-10-CM | POA: Diagnosis not present

## 2021-11-18 DIAGNOSIS — G35 Multiple sclerosis: Secondary | ICD-10-CM | POA: Diagnosis not present

## 2021-11-18 DIAGNOSIS — F32A Depression, unspecified: Secondary | ICD-10-CM | POA: Diagnosis not present

## 2021-11-18 DIAGNOSIS — S76012D Strain of muscle, fascia and tendon of left hip, subsequent encounter: Secondary | ICD-10-CM | POA: Diagnosis not present

## 2021-11-18 DIAGNOSIS — K5790 Diverticulosis of intestine, part unspecified, without perforation or abscess without bleeding: Secondary | ICD-10-CM | POA: Diagnosis not present

## 2021-11-18 DIAGNOSIS — Z79899 Other long term (current) drug therapy: Secondary | ICD-10-CM | POA: Diagnosis not present

## 2021-11-18 DIAGNOSIS — I350 Nonrheumatic aortic (valve) stenosis: Secondary | ICD-10-CM | POA: Diagnosis not present

## 2021-11-18 DIAGNOSIS — H9190 Unspecified hearing loss, unspecified ear: Secondary | ICD-10-CM | POA: Diagnosis not present

## 2021-11-18 DIAGNOSIS — I341 Nonrheumatic mitral (valve) prolapse: Secondary | ICD-10-CM | POA: Diagnosis not present

## 2021-11-18 DIAGNOSIS — G4700 Insomnia, unspecified: Secondary | ICD-10-CM | POA: Diagnosis not present

## 2021-11-18 DIAGNOSIS — Z791 Long term (current) use of non-steroidal anti-inflammatories (NSAID): Secondary | ICD-10-CM | POA: Diagnosis not present

## 2021-11-18 DIAGNOSIS — Z1231 Encounter for screening mammogram for malignant neoplasm of breast: Secondary | ICD-10-CM

## 2021-11-18 NOTE — Telephone Encounter (Signed)
Please call patient has medication question. [phone]

## 2021-11-18 NOTE — Telephone Encounter (Signed)
Called pharmacy and advised they should fill baclofen '10mg'$  1 po TID sent on 11/11/21. Spoke w/ Ariel. She verbalized understanding and will get ready for pt. Nothing further needed.

## 2021-11-18 NOTE — Telephone Encounter (Signed)
Pt said, [facility] does not have the prescription for baclofen (LIORESAL) 10 MG tablet. [facility] Would like a call from the nurse

## 2021-12-02 DIAGNOSIS — Z23 Encounter for immunization: Secondary | ICD-10-CM | POA: Diagnosis not present

## 2021-12-03 DIAGNOSIS — M25552 Pain in left hip: Secondary | ICD-10-CM | POA: Diagnosis not present

## 2021-12-03 DIAGNOSIS — M25551 Pain in right hip: Secondary | ICD-10-CM | POA: Diagnosis not present

## 2021-12-05 DIAGNOSIS — S76012D Strain of muscle, fascia and tendon of left hip, subsequent encounter: Secondary | ICD-10-CM | POA: Diagnosis not present

## 2021-12-05 DIAGNOSIS — M1990 Unspecified osteoarthritis, unspecified site: Secondary | ICD-10-CM | POA: Diagnosis not present

## 2021-12-05 DIAGNOSIS — G35 Multiple sclerosis: Secondary | ICD-10-CM | POA: Diagnosis not present

## 2021-12-05 DIAGNOSIS — Z9181 History of falling: Secondary | ICD-10-CM | POA: Diagnosis not present

## 2021-12-05 DIAGNOSIS — R413 Other amnesia: Secondary | ICD-10-CM | POA: Diagnosis not present

## 2021-12-05 DIAGNOSIS — H9190 Unspecified hearing loss, unspecified ear: Secondary | ICD-10-CM | POA: Diagnosis not present

## 2021-12-05 DIAGNOSIS — K5790 Diverticulosis of intestine, part unspecified, without perforation or abscess without bleeding: Secondary | ICD-10-CM | POA: Diagnosis not present

## 2021-12-05 DIAGNOSIS — F32A Depression, unspecified: Secondary | ICD-10-CM | POA: Diagnosis not present

## 2021-12-05 DIAGNOSIS — Z791 Long term (current) use of non-steroidal anti-inflammatories (NSAID): Secondary | ICD-10-CM | POA: Diagnosis not present

## 2021-12-05 DIAGNOSIS — S76011D Strain of muscle, fascia and tendon of right hip, subsequent encounter: Secondary | ICD-10-CM | POA: Diagnosis not present

## 2021-12-05 DIAGNOSIS — S76311D Strain of muscle, fascia and tendon of the posterior muscle group at thigh level, right thigh, subsequent encounter: Secondary | ICD-10-CM | POA: Diagnosis not present

## 2021-12-05 DIAGNOSIS — I350 Nonrheumatic aortic (valve) stenosis: Secondary | ICD-10-CM | POA: Diagnosis not present

## 2021-12-05 DIAGNOSIS — Z79899 Other long term (current) drug therapy: Secondary | ICD-10-CM | POA: Diagnosis not present

## 2021-12-05 DIAGNOSIS — G4700 Insomnia, unspecified: Secondary | ICD-10-CM | POA: Diagnosis not present

## 2021-12-05 DIAGNOSIS — I341 Nonrheumatic mitral (valve) prolapse: Secondary | ICD-10-CM | POA: Diagnosis not present

## 2021-12-05 DIAGNOSIS — L409 Psoriasis, unspecified: Secondary | ICD-10-CM | POA: Diagnosis not present

## 2021-12-05 DIAGNOSIS — I119 Hypertensive heart disease without heart failure: Secondary | ICD-10-CM | POA: Diagnosis not present

## 2021-12-05 DIAGNOSIS — E785 Hyperlipidemia, unspecified: Secondary | ICD-10-CM | POA: Diagnosis not present

## 2021-12-05 DIAGNOSIS — Z87891 Personal history of nicotine dependence: Secondary | ICD-10-CM | POA: Diagnosis not present

## 2021-12-09 DIAGNOSIS — K5790 Diverticulosis of intestine, part unspecified, without perforation or abscess without bleeding: Secondary | ICD-10-CM | POA: Diagnosis not present

## 2021-12-09 DIAGNOSIS — S76311D Strain of muscle, fascia and tendon of the posterior muscle group at thigh level, right thigh, subsequent encounter: Secondary | ICD-10-CM | POA: Diagnosis not present

## 2021-12-09 DIAGNOSIS — E785 Hyperlipidemia, unspecified: Secondary | ICD-10-CM | POA: Diagnosis not present

## 2021-12-09 DIAGNOSIS — Z87891 Personal history of nicotine dependence: Secondary | ICD-10-CM | POA: Diagnosis not present

## 2021-12-09 DIAGNOSIS — S76012D Strain of muscle, fascia and tendon of left hip, subsequent encounter: Secondary | ICD-10-CM | POA: Diagnosis not present

## 2021-12-09 DIAGNOSIS — M1990 Unspecified osteoarthritis, unspecified site: Secondary | ICD-10-CM | POA: Diagnosis not present

## 2021-12-09 DIAGNOSIS — L409 Psoriasis, unspecified: Secondary | ICD-10-CM | POA: Diagnosis not present

## 2021-12-09 DIAGNOSIS — H9190 Unspecified hearing loss, unspecified ear: Secondary | ICD-10-CM | POA: Diagnosis not present

## 2021-12-09 DIAGNOSIS — I341 Nonrheumatic mitral (valve) prolapse: Secondary | ICD-10-CM | POA: Diagnosis not present

## 2021-12-09 DIAGNOSIS — Z79899 Other long term (current) drug therapy: Secondary | ICD-10-CM | POA: Diagnosis not present

## 2021-12-09 DIAGNOSIS — I350 Nonrheumatic aortic (valve) stenosis: Secondary | ICD-10-CM | POA: Diagnosis not present

## 2021-12-09 DIAGNOSIS — F32A Depression, unspecified: Secondary | ICD-10-CM | POA: Diagnosis not present

## 2021-12-09 DIAGNOSIS — G35 Multiple sclerosis: Secondary | ICD-10-CM | POA: Diagnosis not present

## 2021-12-09 DIAGNOSIS — Z791 Long term (current) use of non-steroidal anti-inflammatories (NSAID): Secondary | ICD-10-CM | POA: Diagnosis not present

## 2021-12-09 DIAGNOSIS — S76011D Strain of muscle, fascia and tendon of right hip, subsequent encounter: Secondary | ICD-10-CM | POA: Diagnosis not present

## 2021-12-09 DIAGNOSIS — R413 Other amnesia: Secondary | ICD-10-CM | POA: Diagnosis not present

## 2021-12-09 DIAGNOSIS — G4700 Insomnia, unspecified: Secondary | ICD-10-CM | POA: Diagnosis not present

## 2021-12-09 DIAGNOSIS — I119 Hypertensive heart disease without heart failure: Secondary | ICD-10-CM | POA: Diagnosis not present

## 2021-12-09 DIAGNOSIS — Z9181 History of falling: Secondary | ICD-10-CM | POA: Diagnosis not present

## 2021-12-17 DIAGNOSIS — L409 Psoriasis, unspecified: Secondary | ICD-10-CM | POA: Diagnosis not present

## 2021-12-17 DIAGNOSIS — Z87891 Personal history of nicotine dependence: Secondary | ICD-10-CM | POA: Diagnosis not present

## 2021-12-17 DIAGNOSIS — M1990 Unspecified osteoarthritis, unspecified site: Secondary | ICD-10-CM | POA: Diagnosis not present

## 2021-12-17 DIAGNOSIS — F32A Depression, unspecified: Secondary | ICD-10-CM | POA: Diagnosis not present

## 2021-12-17 DIAGNOSIS — I119 Hypertensive heart disease without heart failure: Secondary | ICD-10-CM | POA: Diagnosis not present

## 2021-12-17 DIAGNOSIS — R413 Other amnesia: Secondary | ICD-10-CM | POA: Diagnosis not present

## 2021-12-17 DIAGNOSIS — Z79899 Other long term (current) drug therapy: Secondary | ICD-10-CM | POA: Diagnosis not present

## 2021-12-17 DIAGNOSIS — S76311D Strain of muscle, fascia and tendon of the posterior muscle group at thigh level, right thigh, subsequent encounter: Secondary | ICD-10-CM | POA: Diagnosis not present

## 2021-12-17 DIAGNOSIS — S76012D Strain of muscle, fascia and tendon of left hip, subsequent encounter: Secondary | ICD-10-CM | POA: Diagnosis not present

## 2021-12-17 DIAGNOSIS — E785 Hyperlipidemia, unspecified: Secondary | ICD-10-CM | POA: Diagnosis not present

## 2021-12-17 DIAGNOSIS — K5790 Diverticulosis of intestine, part unspecified, without perforation or abscess without bleeding: Secondary | ICD-10-CM | POA: Diagnosis not present

## 2021-12-17 DIAGNOSIS — G35 Multiple sclerosis: Secondary | ICD-10-CM | POA: Diagnosis not present

## 2021-12-17 DIAGNOSIS — I341 Nonrheumatic mitral (valve) prolapse: Secondary | ICD-10-CM | POA: Diagnosis not present

## 2021-12-17 DIAGNOSIS — S76011D Strain of muscle, fascia and tendon of right hip, subsequent encounter: Secondary | ICD-10-CM | POA: Diagnosis not present

## 2021-12-17 DIAGNOSIS — G4700 Insomnia, unspecified: Secondary | ICD-10-CM | POA: Diagnosis not present

## 2021-12-17 DIAGNOSIS — Z791 Long term (current) use of non-steroidal anti-inflammatories (NSAID): Secondary | ICD-10-CM | POA: Diagnosis not present

## 2021-12-17 DIAGNOSIS — I350 Nonrheumatic aortic (valve) stenosis: Secondary | ICD-10-CM | POA: Diagnosis not present

## 2021-12-17 DIAGNOSIS — H9190 Unspecified hearing loss, unspecified ear: Secondary | ICD-10-CM | POA: Diagnosis not present

## 2021-12-17 DIAGNOSIS — Z9181 History of falling: Secondary | ICD-10-CM | POA: Diagnosis not present

## 2021-12-20 ENCOUNTER — Ambulatory Visit: Payer: Medicare Other

## 2021-12-29 ENCOUNTER — Other Ambulatory Visit: Payer: Self-pay | Admitting: Neurology

## 2022-01-07 DIAGNOSIS — R7303 Prediabetes: Secondary | ICD-10-CM | POA: Diagnosis not present

## 2022-01-07 DIAGNOSIS — G8929 Other chronic pain: Secondary | ICD-10-CM | POA: Diagnosis not present

## 2022-01-07 DIAGNOSIS — M797 Fibromyalgia: Secondary | ICD-10-CM | POA: Diagnosis not present

## 2022-01-07 DIAGNOSIS — G35 Multiple sclerosis: Secondary | ICD-10-CM | POA: Diagnosis not present

## 2022-01-07 DIAGNOSIS — I10 Essential (primary) hypertension: Secondary | ICD-10-CM | POA: Diagnosis not present

## 2022-01-07 DIAGNOSIS — F5101 Primary insomnia: Secondary | ICD-10-CM | POA: Diagnosis not present

## 2022-01-14 ENCOUNTER — Ambulatory Visit
Admission: RE | Admit: 2022-01-14 | Discharge: 2022-01-14 | Disposition: A | Discharge status: Home / Self Care | Payer: Medicare Other | Source: Ambulatory Visit | Attending: Family Medicine | Admitting: Family Medicine

## 2022-01-14 DIAGNOSIS — Z1231 Encounter for screening mammogram for malignant neoplasm of breast: Secondary | ICD-10-CM

## 2022-01-28 DIAGNOSIS — G959 Disease of spinal cord, unspecified: Secondary | ICD-10-CM | POA: Diagnosis not present

## 2022-01-28 DIAGNOSIS — M545 Low back pain, unspecified: Secondary | ICD-10-CM | POA: Diagnosis not present

## 2022-01-29 ENCOUNTER — Other Ambulatory Visit: Payer: Self-pay | Admitting: Neurology

## 2022-01-29 DIAGNOSIS — E7800 Pure hypercholesterolemia, unspecified: Secondary | ICD-10-CM | POA: Diagnosis not present

## 2022-01-29 DIAGNOSIS — R3915 Urgency of urination: Secondary | ICD-10-CM

## 2022-01-29 DIAGNOSIS — R350 Frequency of micturition: Secondary | ICD-10-CM

## 2022-01-29 DIAGNOSIS — R7303 Prediabetes: Secondary | ICD-10-CM | POA: Diagnosis not present

## 2022-01-29 DIAGNOSIS — I10 Essential (primary) hypertension: Secondary | ICD-10-CM | POA: Diagnosis not present

## 2022-01-30 ENCOUNTER — Telehealth: Payer: Self-pay | Admitting: Neurology

## 2022-01-30 NOTE — Telephone Encounter (Signed)
Pt asking Dr. Garth Bigness thoughts on having surgery recommended by [facility] on hernia discs C5,C6,C7. Would like a call from the nurse after 2 pm.

## 2022-01-30 NOTE — Telephone Encounter (Signed)
Called and spoke w/ pt. Confirmed she has herniated disc at C5-C7. Dr. Lynann Bologna at [facility] is recommending surgery. She saw him yesterday. He is supposed to be faxing his note over to Dr. Felecia Shelling for review. Pt would like Dr. Garth Bigness thoughts on this. Aware we will keep an eye out for note from Dr. Lynann Bologna and will have Dr. Felecia Shelling review and will be back in touch after. She verbalized understanding.

## 2022-02-03 NOTE — Telephone Encounter (Signed)
Called and spoke with pt. Relayed Dr. Garth Bigness message. Pt verbalized understanding and appreciation.

## 2022-02-03 NOTE — Telephone Encounter (Signed)
Received note, gave to MD for review.

## 2022-02-04 DIAGNOSIS — G959 Disease of spinal cord, unspecified: Secondary | ICD-10-CM | POA: Diagnosis not present

## 2022-02-07 ENCOUNTER — Other Ambulatory Visit: Payer: Self-pay | Admitting: Orthopedic Surgery

## 2022-03-04 ENCOUNTER — Telehealth: Payer: Self-pay | Admitting: Neurology

## 2022-03-04 NOTE — Telephone Encounter (Signed)
Pt is calling. Stated she would like to speak with the nurse about PT. Pt is requesting a call back from nurse.

## 2022-03-04 NOTE — Telephone Encounter (Signed)
Called pt back. She decided not to get surgery via Dr. Laure Kidney for herniated disc at C5-C7. It was a high cost and not comfortable proceeding. She is doing ok right now. She spoke with her sister who had friend who went through program for spine support. She called this program (she cannot remember the name) and waiting to hear back to schedule an appt for this.  She also got referral placed for PT via PCP, Dr. Tamala Julian.

## 2022-03-06 ENCOUNTER — Other Ambulatory Visit (HOSPITAL_COMMUNITY): Payer: Medicare Other

## 2022-03-12 ENCOUNTER — Ambulatory Visit: Admit: 2022-03-12 | Payer: Medicare Other | Admitting: Orthopedic Surgery

## 2022-03-12 SURGERY — ANTERIOR CERVICAL DECOMPRESSION/DISCECTOMY FUSION 2 LEVELS
Anesthesia: General

## 2022-03-21 DIAGNOSIS — U071 COVID-19: Secondary | ICD-10-CM | POA: Diagnosis not present

## 2022-03-21 DIAGNOSIS — B349 Viral infection, unspecified: Secondary | ICD-10-CM | POA: Diagnosis not present

## 2022-03-26 ENCOUNTER — Telehealth: Payer: Self-pay | Admitting: Neurology

## 2022-03-26 NOTE — Telephone Encounter (Signed)
Pt called needing to discuss her baclofen (LIORESAL) 10 MG tablet with the RN or Provider

## 2022-03-26 NOTE — Telephone Encounter (Signed)
Called pt back to further discuss. She currently is recovering from Covid.  She reports that when she lays down to go to sleep, legs tense up. This is intermittent, not daily. This started in the last couple weeks. Taking baclofen '10mg'$  po TID. Advised it would be best for her to wait to discuss at her upcoming appt with Dr. Felecia Shelling 04/03/22 at 1pm.  She verbalized understanding and appreciation.

## 2022-03-27 ENCOUNTER — Other Ambulatory Visit: Payer: Self-pay | Admitting: Neurology

## 2022-04-03 ENCOUNTER — Telehealth: Payer: Self-pay | Admitting: Neurology

## 2022-04-03 ENCOUNTER — Encounter: Payer: Self-pay | Admitting: Neurology

## 2022-04-03 ENCOUNTER — Ambulatory Visit (INDEPENDENT_AMBULATORY_CARE_PROVIDER_SITE_OTHER): Payer: 59 | Admitting: Neurology

## 2022-04-03 VITALS — BP 157/87 | HR 64 | Ht 62.0 in | Wt 157.5 lb

## 2022-04-03 DIAGNOSIS — R3915 Urgency of urination: Secondary | ICD-10-CM

## 2022-04-03 DIAGNOSIS — R531 Weakness: Secondary | ICD-10-CM

## 2022-04-03 DIAGNOSIS — G35 Multiple sclerosis: Secondary | ICD-10-CM

## 2022-04-03 DIAGNOSIS — R5383 Other fatigue: Secondary | ICD-10-CM | POA: Diagnosis not present

## 2022-04-03 DIAGNOSIS — R200 Anesthesia of skin: Secondary | ICD-10-CM | POA: Diagnosis not present

## 2022-04-03 DIAGNOSIS — M5432 Sciatica, left side: Secondary | ICD-10-CM

## 2022-04-03 DIAGNOSIS — R269 Unspecified abnormalities of gait and mobility: Secondary | ICD-10-CM

## 2022-04-03 NOTE — Telephone Encounter (Signed)
[facility] is taking the patient.

## 2022-04-03 NOTE — Telephone Encounter (Signed)
Can you please change the PT referral to a home health referral and put PT as the service needed

## 2022-04-03 NOTE — Progress Notes (Addendum)
[facility]  PATIENT: [name] DOB: 03-14-1956  REFERRING DOCTOR OR PCP:  Dr. Leta Baptist  SOURCE: patient, MRI images on PACS, imaging reports, notes in EPIC and labs in EPIC  _________________________________   HISTORICAL  CHIEF COMPLAINT:  Chief Complaint  Patient presents with   Room 10    Pt is here Alone. Pt states that her muscle in her legs will twitch at night making it hard for her to sleep.     HISTORY OF PRESENT ILLNESS:  [name] is a 67 y.o. woman with relapsing remitting multiple sclerosis diagnosed in 1983  Update  04/03/2022 She stopped Betaseron in 2021 and remains off of any disease modifying therapy.  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    Gait is about the same - uses walker mostly but sometimes cane.    She notes her legs seem to vibrate when she sits down.  This is not painful.     The left leg is mildly weaker and stiffer than the right.  Balance is off.   She feels the dalfampridine has helped her gait.    She does take concurrent tramadol but just 25 mg po qHS and we discussed risk of seizures.     She has some spasticity and takes baclofen.     Solifenacin with Logan Bores helps her urinary frequency/urgency better than solifenacin alone.  There has RLS and takes gabapentin and ropinirole with some benefit.    She is also on trazodone due to insomnia.   She has reduced short term memory and reduced attention and some word finding ererors.  This has also gradually worsened over the years.   She has depression and anxiety and is on combination of sertraline and buspar.    She notes left hip and buttock pain.  It is  not as bad as before the piriformis injection but it was much better a few months after the TPI.   She takes one half of the tramadol (25 mg) at night which helps her fall asleep   MS History:    In 1983, while at work, she was having difficulty with her gait and was unsteady and falling. She R doctor and was referred to Dr. Morrell Riddle of [facility] neurology who diagnosed her with multiple sclerosis. She was in the Betaseron drug study (was on placebo) with Dr. Terie Purser in [place]. When Betaseron became available in 1992 or 1993 she started the medication and was seen Dr. Erling Cruz. Over the next few years she had a couple exacerbations that involved leg function, gait or balance. She had some treatments with IV steroids. She also has had some slow worsening with her gait more recently.   MRI Review:   MRI of the brain and cervical spine from 07/25/2013. In the brain, there are extensive T2/FLAIR hyperintense foci involving the cerebellum, left posterior medulla, there was a normal enhancement pattern. The cervical spine showed hyperintense foci adjacent to C3, C5-C6, C6, and T1. Right posterior pons, bilateral hemispheres, predominantly in the periventricular cortical white matter.   None of these  enhanced.  MRI of the brain March 2020 showed Extensive T2/FLAIR hyperintense foci in the hemispheres and a few foci in the pons and cerebellum in a pattern and configuration consistent with chronic demyelinating plaque associated with multiple sclerosis.  There could also be superimposed foci of chronic microvascular ischemic change.  The MRI is essentially unchanged compared to 2011  MRI of the brain 06/18/2019 shows Extensive T2/FLAIR hyperintense foci in the hemispheres and a few foci in the pons and cerebellum in a pattern and configuration consistent with chronic demyelinating plaque associated with multiple sclerosis.  There could also be superimposed foci of chronic microvascular ischemic change.  There were no  changes compared to the MRI dated 10/28/2016  MRI of the brain 06/30/2020 shows Multiple T2/FLAIR hyperintense foci in the hemispheres, brainstem and cerebellum in a pattern and configuration consistent with chronic demyelinating plaque associated with multiple sclerosis.  None of the foci appear to be acute.  They do not enhance.  Compared to the MRI from 06/18/2019, there do not appear to be any new lesions.  MRI brain 08/31/2021 showed T2/FLAIR hyperintense foci in the cerebral hemispheres, brainstem and cerebellum in a pattern consistent with chronic demyelinating plaque associated with multiple sclerosis. None of the foci appear to be acute. They do not enhance. Compared to the MRI dated 06/30/2020 there were no new lesions.   MRI cervical spine showed T2 hyperintense foci within the spinal cord adjacent to C3, C5-C6 and C6-C7.  These are consistent with stable chronic demyelinating plaque associated with multiple sclerosis.  They do not enhance.  They are also present on the MRI from 07/25/2013.      At C5-C6, there is mild spinal stenosis due to central disc protrusion and other degenerative changes.  There is mild foraminal narrowing but no nerve root compression.  These changes have progressed compared to the 2015 MRI.  At C6-C7, there is a stable left disc osteophyte complex causing moderate severe left foraminal narrowing that could lead to left C7 nerve root compression.  The degenerative changes are stable compared to the 2015 MRI.    REVIEW OF SYSTEMS: Constitutional: No fevers, chills, sweats, or change in appetite.  Fatigue and sleepiness.  She has maintenance insomnia Eyes: No visual changes, double vision, eye pain.   Some fatigue. She has insomnia helped by trazodone. Ear, nose and throat: No hearing loss, ear pain, nasal congestion, sore throat Cardiovascular: No chest pain, palpitations Respiratory:  No shortness of breath at rest or with exertion.   No wheezes.   She  snores GastrointestinaI: No nausea, vomiting, diarrhea, abdominal pain, fecal incontinence Genitourinary:  as above Musculoskeletal:  No neck pain, back pain Integumentary: No rash, pruritus, skin lesions Neurological: as above Psychiatric: No depression at this time.  No anxiety Endocrine: No palpitations, diaphoresis, change in appetite, change in weigh or increased thirst Hematologic/Lymphatic:  No anemia, purpura, petechiae. Allergic/Immunologic: No itchy/runny eyes, nasal congestion, recent allergic reactions, rashes  ALLERGIES: Allergies  Allergen Reactions   Other Other (See Comments)    Pt states she took a medication 2-3 yrs ago that may have been for inflammation and it caused her to be weak.   Myrbetriq [Mirabegron]     Caused her to dry out/nose bleeds    HOME MEDICATIONS:  Current Outpatient Medications:    acetaminophen (TYLENOL) 500 MG tablet, Take 500 mg by mouth every 6 (six) hours as needed., Disp: , Rfl:    ALPRAZolam (XANAX) 1 MG tablet, Take 1 tablet (1 mg  total) by mouth as needed for anxiety. To take 30 min prior to MRI., Disp: 2 tablet, Rfl: 0   amLODipine (NORVASC) 10 MG tablet, Take 10 mg by mouth daily., Disp: , Rfl:    atorvastatin (LIPITOR) 20 MG tablet, Take 20 mg by mouth daily., Disp: , Rfl:    baclofen (LIORESAL) 10 MG tablet, Take 1 tablet (10 mg total) by mouth 3 (three) times daily., Disp: 90 tablet, Rfl: 11   benzonatate (TESSALON) 200 MG capsule, Take 200 mg by mouth 3 (three) times daily as needed., Disp: , Rfl:    busPIRone (BUSPAR) 15 MG tablet, TAKE ONE TABLET BY MOUTH EVERY MORNING and TAKE ONE TABLET BY MOUTH EVERYDAY AT BEDTIME, Disp: 180 tablet, Rfl: 2   dalfampridine 10 MG TB12, TAKE ONE TABLET BY MOUTH EVERY MORNING and TAKE ONE TABLET BY MOUTH EVERYDAY AT BEDTIME, Disp: 60 tablet, Rfl: 11   esomeprazole (NEXIUM) 40 MG capsule, Take 40 mg by mouth daily., Disp: , Rfl:    estradiol (ESTRACE) 0.1 MG/GM vaginal cream, Place vaginally every  evening., Disp: , Rfl:    famotidine (PEPCID) 20 MG tablet, Take 20 mg by mouth at bedtime., Disp: , Rfl:    fluticasone (FLONASE) 50 MCG/ACT nasal spray, Place 2 sprays into the nose as needed. For stuffy nose., Disp: , Rfl:    gabapentin (NEURONTIN) 100 MG capsule, TAKE ONE CAPSULE BY MOUTH EVERY MORNING and TAKE ONE CAPSULE AT NOON and TAKE ONE CAPSULE BY MOUTH EVERYDAY AT BEDTIME, Disp: 270 capsule, Rfl: 2   GEMTESA 75 MG TABS, TAKE ONE TABLET BY MOUTH EVERYDAY AT BEDTIME FOR urgency of urination, Disp: 30 tablet, Rfl: 2   hydrochlorothiazide (HYDRODIURIL) 25 MG tablet, Take 1 tablet by mouth daily., Disp: , Rfl:    ibuprofen (ADVIL,MOTRIN) 600 MG tablet, Take 1 tablet by mouth every 8 (eight) hours as needed for mild pain or moderate pain. , Disp: , Rfl:    Lidocaine, Anorectal, 5 % CREA, Apply topically 3 (three) times daily as needed., Disp: , Rfl:    Loperamide HCl (ANTI-DIARRHEAL PO), Take 2 mg by mouth as needed., Disp: , Rfl:    losartan (COZAAR) 100 MG tablet, Take 1 tablet by mouth daily., Disp: , Rfl:    losartan-hydrochlorothiazide (HYZAAR) 100-25 MG tablet, Take 1 tablet by mouth daily., Disp: , Rfl:    meclizine (ANTIVERT) 25 MG tablet, Take 25 mg by mouth as needed for dizziness., Disp: , Rfl:    metoprolol succinate (TOPROL-XL) 25 MG 24 hr tablet, Take 1 tablet (25 mg total) by mouth daily as needed. As needed for palpitations., Disp: 90 tablet, Rfl: 2   montelukast (SINGULAIR) 10 MG tablet, Take 10 mg by mouth at bedtime., Disp: , Rfl:    mupirocin ointment (BACTROBAN) 2 %, mupirocin 2 % topical ointment  APPLY A SMALL AMOUNT TO THE AFFECTED AREA BY TOPICAL ROUTE 3 TIMES PER DAY, Disp: , Rfl:    nystatin (MYCOSTATIN/NYSTOP) powder, Apply topically 4 (four) times daily., Disp: , Rfl:    nystatin cream (MYCOSTATIN), Apply 1 application topically 2 (two) times daily., Disp: , Rfl:    ondansetron (ZOFRAN) 4 MG tablet, Take 4-8 mg by mouth every 6 (six) hours as needed for nausea or  vomiting., Disp: , Rfl:    potassium chloride (K-DUR) 10 MEQ tablet, Take 10 mEq by mouth daily., Disp: , Rfl:    RESTASIS 0.05 % ophthalmic emulsion, 1 drop 2 (two) times daily., Disp: , Rfl:    rOPINIRole (REQUIP)  0.5 MG tablet, Take 1 tablet in the morning, 1 tablet in the evening  and 2 tablet at bedtime, Disp: 120 tablet, Rfl: 11   sertraline (ZOLOFT) 100 MG tablet, TAKE 1 AND 1/2 TABLETS BY MOUTH ONCE DAILY, Disp: 135 tablet, Rfl: 2   solifenacin (VESICARE) 10 MG tablet, TAKE ONE TABLET BY MOUTH ONCE DAILY FOR overactive bladder, Disp: 90 tablet, Rfl: 1   SUDOGEST 30 MG tablet, Take 30 mg by mouth., Disp: , Rfl:    traMADol (ULTRAM) 50 MG tablet, Take 1 tablet (50 mg total) by mouth daily., Disp: 30 tablet, Rfl: 0   traZODone (DESYREL) 100 MG tablet, Take 50 mg by mouth at bedtime., Disp: , Rfl:   PAST MEDICAL HISTORY: Past Medical History:  Diagnosis Date   Anemia    HX   Anxiety    Arthritis    Chronic kidney disease    UTI'S   Complication of anesthesia    BP RISES WHEN AWAKENING   Complication of anesthesia    "difficult waking up and can become combative"    GERD (gastroesophageal reflux disease)    H/O hiatal hernia    Headache(784.0)    Heart murmur    Hypertension    Lumbar spinal stenosis    Lumbar spondylolysis    MS (multiple sclerosis) (HCC)    Neuromuscular disorder (HCC)    MS   Urinary frequency    Wears glasses     PAST SURGICAL HISTORY: Past Surgical History:  Procedure Laterality Date   CHOLECYSTECTOMY     EYE SURGERY     BLEPHAROPLASTY   LUMBAR FUSION  05/10/2015   POSTERIOR FUSION LUMBAR SPINE  04/16/11   L4-5   TRANSFORAMINAL LUMBAR INTERBODY FUSION (TLIF) WITH PEDICLE SCREW FIXATION 1 LEVEL N/A 05/10/2015   Procedure: TRANSFORAMINAL LUMBAR INTERBODY FUSION (TLIF) WITH PEDICLE SCREW FIXATION L3-4;  Surgeon: Eustace Moore, MD;  Location: [facility];  Service: Neurosurgery;  Laterality: N/A;  TRANSFORAMINAL LUMBAR INTERBODY FUSION (TLIF) WITH  PEDICLE SCREW FIXATION L3-4   UTERINE FIBROID EMBOLIZATION      FAMILY HISTORY: Family History  Problem Relation Age of Onset   Lung cancer Mother    Emphysema Father    Heart disease Other     SOCIAL HISTORY:  Social History   Socioeconomic History   Marital status: Single    Spouse name: Not on file   Number of children: 0   Years of education: 14   Highest education level: Not on file  Occupational History    Comment: Does not work - Disabled  Tobacco Use   Smoking status: Former    Packs/day: 2.00    Types: Cigarettes    Quit date: 04/08/1996    Years since quitting: 26.0   Smokeless tobacco: Never   Tobacco comments:    ALCOHOL IN PAST  Substance and Sexual Activity   Alcohol use: No   Drug use: No   Sexual activity: Yes    Birth control/protection: Post-menopausal  Other Topics Concern   Not on file  Social History Narrative   Patient does not work she is disabled. and lives at home. Patient is single.   Caffeine: 1-2 cups daily depending on the day   Right handed.   Social Determinants of Health   Financial Resource Strain: Not on file  Food Insecurity: Not on file  Transportation Needs: Not on file  Physical Activity: Not on file  Stress: Not on file  Social Connections: Not on file  Intimate Partner Violence: Not on file     PHYSICAL EXAM  Vitals:   04/03/22 1255  BP: (!) 157/87  Pulse: 64  Weight: 157 lb 8 oz (71.4 kg)  Height: '5\' 2"'$  (1.575 m)     Body mass index is 28.81 kg/m.    General: The patient is well-developed and well-nourished and in no acute distress.    Skin: Extremities are without rash or edema.  Neurologic Exam  Mental status: The patient is alert and oriented x 3 at the time of the examination. The patient has apparent normal recent and remote memory, with an apparently normal attention span and concentration ability.   Speech is normal.  Cranial nerves: Extraocular movements are full.  Facial strength and  sensation was normal.  Trapezius strength is normal.  No obvious hearing deficits are noted.  Motor:  Muscle bulk is normal.  Muscle tone is increased in the legs.  Strength was 5/5 except 4+/5 left iliopsoas and toe extension  Sensory: Normal touch and vibration sensation in limbs.    Coordination: She has good finger-nose-finger bilaterally.  Heel-to-shin is mildly reduced  Gait and station: Station is normal.  Gait is mildly wide.  She can walk without support, in the room but is off balanced, especially with turn.  Unable to tandem walk  She walks better with walker.   Romberg is borderline.   Reflexes: Deep tendon reflexes are symmetric and normal bilaterally.        ASSESSMENT AND PLAN  Multiple sclerosis ([place]) - Plan: Ambulatory referral to Physical Therapy  Gait disturbance - Plan: Ambulatory referral to Physical Therapy  Other fatigue  Urinary urgency  Left sided sciatica  Numbness   1.   She will remain off of a disease modifying therapy for MS (SPMS).  Consider checking MRI around the time of her next visit.   2.   Dalfampridine for MS gait.    She is on a very low dose of tramadol and we discussed risk of seizure may be higher on the combination.  Advised not to increase.   Renal function is normal 3.   Stay active.  Exercise as tolerated 4,   Requip and gabapentin for RLS. 5.    Due to poor balance and risk of falls, advised to obtain a rollator style walker.   6.  She will return in 9 months or sooner if there are new or worsening neurologic symptoms.    40-minute office visit with the majority of the time spent face-to-face for history and physical, discussion/counseling and decision-making.  Additional time with record review and documentation.   Avis Tirone A. Felecia Shelling, MD, PhD 8/67/5449, 2:01 PM Certified in Neurology, Clinical Neurophysiology, Sleep Medicine, Pain Medicine and Neuroimaging  [facility] [address], [place],  [place] [address] [phone]

## 2022-04-03 NOTE — Addendum Note (Signed)
Addended by: Britt Bottom on: 04/03/2022 04:33 PM   Modules accepted: Level of Service

## 2022-04-03 NOTE — Telephone Encounter (Signed)
Order placed for [facility] PT

## 2022-04-03 NOTE — Addendum Note (Signed)
Addended by: Darleen Crocker on: 04/03/2022 03:32 PM   Modules accepted: Orders

## 2022-04-04 DIAGNOSIS — Z9181 History of falling: Secondary | ICD-10-CM | POA: Diagnosis not present

## 2022-04-04 DIAGNOSIS — Z7951 Long term (current) use of inhaled steroids: Secondary | ICD-10-CM | POA: Diagnosis not present

## 2022-04-04 DIAGNOSIS — M48061 Spinal stenosis, lumbar region without neurogenic claudication: Secondary | ICD-10-CM | POA: Diagnosis not present

## 2022-04-04 DIAGNOSIS — Z791 Long term (current) use of non-steroidal anti-inflammatories (NSAID): Secondary | ICD-10-CM | POA: Diagnosis not present

## 2022-04-04 DIAGNOSIS — M1990 Unspecified osteoarthritis, unspecified site: Secondary | ICD-10-CM | POA: Diagnosis not present

## 2022-04-04 DIAGNOSIS — I10 Essential (primary) hypertension: Secondary | ICD-10-CM | POA: Diagnosis not present

## 2022-04-04 DIAGNOSIS — D649 Anemia, unspecified: Secondary | ICD-10-CM | POA: Diagnosis not present

## 2022-04-04 DIAGNOSIS — M5116 Intervertebral disc disorders with radiculopathy, lumbar region: Secondary | ICD-10-CM | POA: Diagnosis not present

## 2022-04-04 DIAGNOSIS — G35 Multiple sclerosis: Secondary | ICD-10-CM | POA: Diagnosis not present

## 2022-04-04 DIAGNOSIS — M5030 Other cervical disc degeneration, unspecified cervical region: Secondary | ICD-10-CM | POA: Diagnosis not present

## 2022-04-04 DIAGNOSIS — Z87891 Personal history of nicotine dependence: Secondary | ICD-10-CM | POA: Diagnosis not present

## 2022-04-04 DIAGNOSIS — G2581 Restless legs syndrome: Secondary | ICD-10-CM | POA: Diagnosis not present

## 2022-04-04 DIAGNOSIS — K219 Gastro-esophageal reflux disease without esophagitis: Secondary | ICD-10-CM | POA: Diagnosis not present

## 2022-04-04 DIAGNOSIS — F32A Depression, unspecified: Secondary | ICD-10-CM | POA: Diagnosis not present

## 2022-04-04 DIAGNOSIS — G4700 Insomnia, unspecified: Secondary | ICD-10-CM | POA: Diagnosis not present

## 2022-04-04 DIAGNOSIS — M4726 Other spondylosis with radiculopathy, lumbar region: Secondary | ICD-10-CM | POA: Diagnosis not present

## 2022-04-04 DIAGNOSIS — Z87440 Personal history of urinary (tract) infections: Secondary | ICD-10-CM | POA: Diagnosis not present

## 2022-04-11 DIAGNOSIS — M5030 Other cervical disc degeneration, unspecified cervical region: Secondary | ICD-10-CM | POA: Diagnosis not present

## 2022-04-11 DIAGNOSIS — Z87891 Personal history of nicotine dependence: Secondary | ICD-10-CM | POA: Diagnosis not present

## 2022-04-11 DIAGNOSIS — K219 Gastro-esophageal reflux disease without esophagitis: Secondary | ICD-10-CM | POA: Diagnosis not present

## 2022-04-11 DIAGNOSIS — D649 Anemia, unspecified: Secondary | ICD-10-CM | POA: Diagnosis not present

## 2022-04-11 DIAGNOSIS — Z7951 Long term (current) use of inhaled steroids: Secondary | ICD-10-CM | POA: Diagnosis not present

## 2022-04-11 DIAGNOSIS — Z791 Long term (current) use of non-steroidal anti-inflammatories (NSAID): Secondary | ICD-10-CM | POA: Diagnosis not present

## 2022-04-11 DIAGNOSIS — Z9181 History of falling: Secondary | ICD-10-CM | POA: Diagnosis not present

## 2022-04-11 DIAGNOSIS — G35 Multiple sclerosis: Secondary | ICD-10-CM | POA: Diagnosis not present

## 2022-04-11 DIAGNOSIS — M1990 Unspecified osteoarthritis, unspecified site: Secondary | ICD-10-CM | POA: Diagnosis not present

## 2022-04-11 DIAGNOSIS — I10 Essential (primary) hypertension: Secondary | ICD-10-CM | POA: Diagnosis not present

## 2022-04-11 DIAGNOSIS — G4700 Insomnia, unspecified: Secondary | ICD-10-CM | POA: Diagnosis not present

## 2022-04-11 DIAGNOSIS — G2581 Restless legs syndrome: Secondary | ICD-10-CM | POA: Diagnosis not present

## 2022-04-11 DIAGNOSIS — M4726 Other spondylosis with radiculopathy, lumbar region: Secondary | ICD-10-CM | POA: Diagnosis not present

## 2022-04-11 DIAGNOSIS — M5116 Intervertebral disc disorders with radiculopathy, lumbar region: Secondary | ICD-10-CM | POA: Diagnosis not present

## 2022-04-11 DIAGNOSIS — F32A Depression, unspecified: Secondary | ICD-10-CM | POA: Diagnosis not present

## 2022-04-11 DIAGNOSIS — Z87440 Personal history of urinary (tract) infections: Secondary | ICD-10-CM | POA: Diagnosis not present

## 2022-04-11 DIAGNOSIS — M48061 Spinal stenosis, lumbar region without neurogenic claudication: Secondary | ICD-10-CM | POA: Diagnosis not present

## 2022-04-15 DIAGNOSIS — M1990 Unspecified osteoarthritis, unspecified site: Secondary | ICD-10-CM | POA: Diagnosis not present

## 2022-04-15 DIAGNOSIS — D649 Anemia, unspecified: Secondary | ICD-10-CM | POA: Diagnosis not present

## 2022-04-15 DIAGNOSIS — Z87891 Personal history of nicotine dependence: Secondary | ICD-10-CM | POA: Diagnosis not present

## 2022-04-15 DIAGNOSIS — Z9181 History of falling: Secondary | ICD-10-CM | POA: Diagnosis not present

## 2022-04-15 DIAGNOSIS — M4726 Other spondylosis with radiculopathy, lumbar region: Secondary | ICD-10-CM | POA: Diagnosis not present

## 2022-04-15 DIAGNOSIS — K219 Gastro-esophageal reflux disease without esophagitis: Secondary | ICD-10-CM | POA: Diagnosis not present

## 2022-04-15 DIAGNOSIS — M5116 Intervertebral disc disorders with radiculopathy, lumbar region: Secondary | ICD-10-CM | POA: Diagnosis not present

## 2022-04-15 DIAGNOSIS — I10 Essential (primary) hypertension: Secondary | ICD-10-CM | POA: Diagnosis not present

## 2022-04-15 DIAGNOSIS — M48061 Spinal stenosis, lumbar region without neurogenic claudication: Secondary | ICD-10-CM | POA: Diagnosis not present

## 2022-04-15 DIAGNOSIS — G35 Multiple sclerosis: Secondary | ICD-10-CM | POA: Diagnosis not present

## 2022-04-15 DIAGNOSIS — M5030 Other cervical disc degeneration, unspecified cervical region: Secondary | ICD-10-CM | POA: Diagnosis not present

## 2022-04-15 DIAGNOSIS — Z87440 Personal history of urinary (tract) infections: Secondary | ICD-10-CM | POA: Diagnosis not present

## 2022-04-15 DIAGNOSIS — F32A Depression, unspecified: Secondary | ICD-10-CM | POA: Diagnosis not present

## 2022-04-15 DIAGNOSIS — G4700 Insomnia, unspecified: Secondary | ICD-10-CM | POA: Diagnosis not present

## 2022-04-15 DIAGNOSIS — G2581 Restless legs syndrome: Secondary | ICD-10-CM | POA: Diagnosis not present

## 2022-04-15 DIAGNOSIS — Z791 Long term (current) use of non-steroidal anti-inflammatories (NSAID): Secondary | ICD-10-CM | POA: Diagnosis not present

## 2022-04-15 DIAGNOSIS — Z7951 Long term (current) use of inhaled steroids: Secondary | ICD-10-CM | POA: Diagnosis not present

## 2022-04-15 NOTE — Telephone Encounter (Signed)
[name] is calling from [facility]. Wanting to know if Dr. Felecia Shelling will sign a order for pt to get Rollator Walker. If Dr. Felecia Shelling will sign order please fax order to.[place] stated pt walker is falling apart.

## 2022-04-15 NOTE — Addendum Note (Signed)
Addended by: Wyvonnia Lora on: 04/15/2022 10:01 AM   Modules accepted: Orders

## 2022-04-15 NOTE — Telephone Encounter (Signed)
Faxed signed order, received fax confirmation.

## 2022-04-15 NOTE — Telephone Encounter (Signed)
Faxed note to [employer] at [phone]. Received fax confirmation.

## 2022-04-15 NOTE — Telephone Encounter (Signed)
Dr. Felecia Shelling, can you addend most recent office note to mention need for new rollator walker? [employer] requesting this for documentation

## 2022-04-15 NOTE — Telephone Encounter (Addendum)
Per Dr. Felecia Shelling, ok to place order. I placed and waiting on MD signature before faxing.

## 2022-04-16 ENCOUNTER — Telehealth: Payer: Self-pay | Admitting: Neurology

## 2022-04-16 MED ORDER — GABAPENTIN 300 MG PO CAPS: ORAL_CAPSULE | ORAL | 3 refills | Status: DC

## 2022-04-16 NOTE — Addendum Note (Signed)
Addended by: Wyvonnia Lora on: 04/16/2022 01:35 PM   Modules accepted: Orders

## 2022-04-16 NOTE — Telephone Encounter (Signed)
Called pt back. Advised she can start with '300mg'$  capsule at bedtime then take '300mg'$  po TID starting tomorrow. Pt verbalized understanding.

## 2022-04-16 NOTE — Telephone Encounter (Signed)
LVM for pt to call office

## 2022-04-16 NOTE — Telephone Encounter (Signed)
Called pt back. Reports when she sits down, it feels her legs have a tight grip around them. Worse in R leg and worse when she is laying down in bed. Denies any injury prior to sx starting. She does some exercises at night that helps w/ sx. However, sx keep her up at night.   Confirmed she is taking baclofen '10mg'$  po TID, gabapentin '100mg'$  po TID, dalfampridine '10mg'$  po BID, requip 0.'5mg'$  1 in the am, 1 in the afternoon and 2 in the evening.   I did advised she is on quite a bit of medication already and Dr. Felecia Shelling may not feel comfortable increasing her baclofen dose. I will send to him to review and call her back w/ his response.

## 2022-04-16 NOTE — Telephone Encounter (Signed)
Pt states for several weeks when she sits her legs give her a fit, not a painful feeling, but it feels as if someone is grabbing at her.  Pt is asking if she can take more a day of the baclofen (LIORESAL) 10 MG tablet , please call pt to discuss this request.

## 2022-04-16 NOTE — Telephone Encounter (Signed)
Pt has called Emma,RN back, please call.

## 2022-04-16 NOTE — Telephone Encounter (Signed)
Took call from phone staff and spoke w/ pt. Relayed Dr. Garth Bigness recommendation. She is agreeable to try this. I called in updated rx to [facility] per pt request.

## 2022-04-16 NOTE — Telephone Encounter (Signed)
Pt is calling. Stated she just picked up gabapentin (NEURONTIN) 300 MG capsule  from the pharmacy. And she wants to know does she need to start it today or tomorrow. Pt is requesting a call back from nurse.

## 2022-04-17 MED ORDER — GABAPENTIN 100 MG PO CAPS: ORAL_CAPSULE | ORAL | 5 refills | Status: DC

## 2022-04-17 NOTE — Telephone Encounter (Signed)
Called pt back. Relayed Dr. Garth Bigness recommendation. She only took one '300mg'$  capsule at bedtime last night around 8:30am. Woke up at 6am and felt super groggy/drunk. Has not taken another dose.   Wondering if there is a dose in between she can take?

## 2022-04-17 NOTE — Telephone Encounter (Signed)
Pt has called to report that the change made to her medication makes her feel drunk, please call pt to discuss.

## 2022-04-17 NOTE — Telephone Encounter (Signed)
Called pt and relayed Dr. Garth Bigness recommendation. She is agreeable to plan. However, she does not have enough '100mg'$  on hand. Updated rx called into [facility] per pt request

## 2022-04-17 NOTE — Telephone Encounter (Signed)
Dr. Felecia Shelling- any other recommendations?

## 2022-04-17 NOTE — Addendum Note (Signed)
Addended by: Wyvonnia Lora on: 04/17/2022 01:22 PM   Modules accepted: Orders

## 2022-04-22 DIAGNOSIS — M961 Postlaminectomy syndrome, not elsewhere classified: Secondary | ICD-10-CM | POA: Diagnosis not present

## 2022-04-22 DIAGNOSIS — G5601 Carpal tunnel syndrome, right upper limb: Secondary | ICD-10-CM | POA: Diagnosis not present

## 2022-04-22 DIAGNOSIS — G35 Multiple sclerosis: Secondary | ICD-10-CM | POA: Diagnosis not present

## 2022-04-22 DIAGNOSIS — Z981 Arthrodesis status: Secondary | ICD-10-CM | POA: Diagnosis not present

## 2022-04-24 DIAGNOSIS — M5116 Intervertebral disc disorders with radiculopathy, lumbar region: Secondary | ICD-10-CM | POA: Diagnosis not present

## 2022-04-24 DIAGNOSIS — M1990 Unspecified osteoarthritis, unspecified site: Secondary | ICD-10-CM | POA: Diagnosis not present

## 2022-04-24 DIAGNOSIS — Z87891 Personal history of nicotine dependence: Secondary | ICD-10-CM | POA: Diagnosis not present

## 2022-04-24 DIAGNOSIS — K219 Gastro-esophageal reflux disease without esophagitis: Secondary | ICD-10-CM | POA: Diagnosis not present

## 2022-04-24 DIAGNOSIS — G2581 Restless legs syndrome: Secondary | ICD-10-CM | POA: Diagnosis not present

## 2022-04-24 DIAGNOSIS — I10 Essential (primary) hypertension: Secondary | ICD-10-CM | POA: Diagnosis not present

## 2022-04-24 DIAGNOSIS — F32A Depression, unspecified: Secondary | ICD-10-CM | POA: Diagnosis not present

## 2022-04-24 DIAGNOSIS — Z87440 Personal history of urinary (tract) infections: Secondary | ICD-10-CM | POA: Diagnosis not present

## 2022-04-24 DIAGNOSIS — M48061 Spinal stenosis, lumbar region without neurogenic claudication: Secondary | ICD-10-CM | POA: Diagnosis not present

## 2022-04-24 DIAGNOSIS — G35 Multiple sclerosis: Secondary | ICD-10-CM | POA: Diagnosis not present

## 2022-04-24 DIAGNOSIS — Z9181 History of falling: Secondary | ICD-10-CM | POA: Diagnosis not present

## 2022-04-24 DIAGNOSIS — Z7951 Long term (current) use of inhaled steroids: Secondary | ICD-10-CM | POA: Diagnosis not present

## 2022-04-24 DIAGNOSIS — D649 Anemia, unspecified: Secondary | ICD-10-CM | POA: Diagnosis not present

## 2022-04-24 DIAGNOSIS — M5030 Other cervical disc degeneration, unspecified cervical region: Secondary | ICD-10-CM | POA: Diagnosis not present

## 2022-04-24 DIAGNOSIS — M4726 Other spondylosis with radiculopathy, lumbar region: Secondary | ICD-10-CM | POA: Diagnosis not present

## 2022-04-24 DIAGNOSIS — G4700 Insomnia, unspecified: Secondary | ICD-10-CM | POA: Diagnosis not present

## 2022-04-24 DIAGNOSIS — Z791 Long term (current) use of non-steroidal anti-inflammatories (NSAID): Secondary | ICD-10-CM | POA: Diagnosis not present

## 2022-04-28 DIAGNOSIS — K219 Gastro-esophageal reflux disease without esophagitis: Secondary | ICD-10-CM | POA: Diagnosis not present

## 2022-04-28 DIAGNOSIS — R053 Chronic cough: Secondary | ICD-10-CM | POA: Diagnosis not present

## 2022-04-28 DIAGNOSIS — M19041 Primary osteoarthritis, right hand: Secondary | ICD-10-CM | POA: Diagnosis not present

## 2022-04-30 DIAGNOSIS — G35 Multiple sclerosis: Secondary | ICD-10-CM | POA: Diagnosis not present

## 2022-04-30 DIAGNOSIS — G2581 Restless legs syndrome: Secondary | ICD-10-CM | POA: Diagnosis not present

## 2022-04-30 DIAGNOSIS — Z87440 Personal history of urinary (tract) infections: Secondary | ICD-10-CM | POA: Diagnosis not present

## 2022-04-30 DIAGNOSIS — K219 Gastro-esophageal reflux disease without esophagitis: Secondary | ICD-10-CM | POA: Diagnosis not present

## 2022-04-30 DIAGNOSIS — Z9181 History of falling: Secondary | ICD-10-CM | POA: Diagnosis not present

## 2022-04-30 DIAGNOSIS — Z87891 Personal history of nicotine dependence: Secondary | ICD-10-CM | POA: Diagnosis not present

## 2022-04-30 DIAGNOSIS — D649 Anemia, unspecified: Secondary | ICD-10-CM | POA: Diagnosis not present

## 2022-04-30 DIAGNOSIS — F32A Depression, unspecified: Secondary | ICD-10-CM | POA: Diagnosis not present

## 2022-04-30 DIAGNOSIS — M5030 Other cervical disc degeneration, unspecified cervical region: Secondary | ICD-10-CM | POA: Diagnosis not present

## 2022-04-30 DIAGNOSIS — G4700 Insomnia, unspecified: Secondary | ICD-10-CM | POA: Diagnosis not present

## 2022-04-30 DIAGNOSIS — I10 Essential (primary) hypertension: Secondary | ICD-10-CM | POA: Diagnosis not present

## 2022-04-30 DIAGNOSIS — M1990 Unspecified osteoarthritis, unspecified site: Secondary | ICD-10-CM | POA: Diagnosis not present

## 2022-04-30 DIAGNOSIS — M4726 Other spondylosis with radiculopathy, lumbar region: Secondary | ICD-10-CM | POA: Diagnosis not present

## 2022-04-30 DIAGNOSIS — Z7951 Long term (current) use of inhaled steroids: Secondary | ICD-10-CM | POA: Diagnosis not present

## 2022-04-30 DIAGNOSIS — M48061 Spinal stenosis, lumbar region without neurogenic claudication: Secondary | ICD-10-CM | POA: Diagnosis not present

## 2022-04-30 DIAGNOSIS — Z791 Long term (current) use of non-steroidal anti-inflammatories (NSAID): Secondary | ICD-10-CM | POA: Diagnosis not present

## 2022-04-30 DIAGNOSIS — M5116 Intervertebral disc disorders with radiculopathy, lumbar region: Secondary | ICD-10-CM | POA: Diagnosis not present

## 2022-05-07 ENCOUNTER — Other Ambulatory Visit: Payer: Self-pay | Admitting: Neurology

## 2022-05-07 DIAGNOSIS — Z87891 Personal history of nicotine dependence: Secondary | ICD-10-CM | POA: Diagnosis not present

## 2022-05-07 DIAGNOSIS — M48061 Spinal stenosis, lumbar region without neurogenic claudication: Secondary | ICD-10-CM | POA: Diagnosis not present

## 2022-05-07 DIAGNOSIS — F32A Depression, unspecified: Secondary | ICD-10-CM | POA: Diagnosis not present

## 2022-05-07 DIAGNOSIS — Z9181 History of falling: Secondary | ICD-10-CM | POA: Diagnosis not present

## 2022-05-07 DIAGNOSIS — I10 Essential (primary) hypertension: Secondary | ICD-10-CM | POA: Diagnosis not present

## 2022-05-07 DIAGNOSIS — M5116 Intervertebral disc disorders with radiculopathy, lumbar region: Secondary | ICD-10-CM | POA: Diagnosis not present

## 2022-05-07 DIAGNOSIS — Z87440 Personal history of urinary (tract) infections: Secondary | ICD-10-CM | POA: Diagnosis not present

## 2022-05-07 DIAGNOSIS — G35 Multiple sclerosis: Secondary | ICD-10-CM | POA: Diagnosis not present

## 2022-05-07 DIAGNOSIS — R3915 Urgency of urination: Secondary | ICD-10-CM

## 2022-05-07 DIAGNOSIS — Z791 Long term (current) use of non-steroidal anti-inflammatories (NSAID): Secondary | ICD-10-CM | POA: Diagnosis not present

## 2022-05-07 DIAGNOSIS — K219 Gastro-esophageal reflux disease without esophagitis: Secondary | ICD-10-CM | POA: Diagnosis not present

## 2022-05-07 DIAGNOSIS — M5030 Other cervical disc degeneration, unspecified cervical region: Secondary | ICD-10-CM | POA: Diagnosis not present

## 2022-05-07 DIAGNOSIS — M4726 Other spondylosis with radiculopathy, lumbar region: Secondary | ICD-10-CM | POA: Diagnosis not present

## 2022-05-07 DIAGNOSIS — M1990 Unspecified osteoarthritis, unspecified site: Secondary | ICD-10-CM | POA: Diagnosis not present

## 2022-05-07 DIAGNOSIS — G4700 Insomnia, unspecified: Secondary | ICD-10-CM | POA: Diagnosis not present

## 2022-05-07 DIAGNOSIS — G2581 Restless legs syndrome: Secondary | ICD-10-CM | POA: Diagnosis not present

## 2022-05-07 DIAGNOSIS — R350 Frequency of micturition: Secondary | ICD-10-CM

## 2022-05-07 DIAGNOSIS — Z7951 Long term (current) use of inhaled steroids: Secondary | ICD-10-CM | POA: Diagnosis not present

## 2022-05-07 DIAGNOSIS — D649 Anemia, unspecified: Secondary | ICD-10-CM | POA: Diagnosis not present

## 2022-05-13 DIAGNOSIS — Z7951 Long term (current) use of inhaled steroids: Secondary | ICD-10-CM | POA: Diagnosis not present

## 2022-05-13 DIAGNOSIS — G4700 Insomnia, unspecified: Secondary | ICD-10-CM | POA: Diagnosis not present

## 2022-05-13 DIAGNOSIS — Z9181 History of falling: Secondary | ICD-10-CM | POA: Diagnosis not present

## 2022-05-13 DIAGNOSIS — I10 Essential (primary) hypertension: Secondary | ICD-10-CM | POA: Diagnosis not present

## 2022-05-13 DIAGNOSIS — M5116 Intervertebral disc disorders with radiculopathy, lumbar region: Secondary | ICD-10-CM | POA: Diagnosis not present

## 2022-05-13 DIAGNOSIS — Z87891 Personal history of nicotine dependence: Secondary | ICD-10-CM | POA: Diagnosis not present

## 2022-05-13 DIAGNOSIS — G2581 Restless legs syndrome: Secondary | ICD-10-CM | POA: Diagnosis not present

## 2022-05-13 DIAGNOSIS — G35 Multiple sclerosis: Secondary | ICD-10-CM | POA: Diagnosis not present

## 2022-05-13 DIAGNOSIS — Z791 Long term (current) use of non-steroidal anti-inflammatories (NSAID): Secondary | ICD-10-CM | POA: Diagnosis not present

## 2022-05-13 DIAGNOSIS — M5030 Other cervical disc degeneration, unspecified cervical region: Secondary | ICD-10-CM | POA: Diagnosis not present

## 2022-05-13 DIAGNOSIS — M4726 Other spondylosis with radiculopathy, lumbar region: Secondary | ICD-10-CM | POA: Diagnosis not present

## 2022-05-13 DIAGNOSIS — M48061 Spinal stenosis, lumbar region without neurogenic claudication: Secondary | ICD-10-CM | POA: Diagnosis not present

## 2022-05-13 DIAGNOSIS — Z87440 Personal history of urinary (tract) infections: Secondary | ICD-10-CM | POA: Diagnosis not present

## 2022-05-13 DIAGNOSIS — K219 Gastro-esophageal reflux disease without esophagitis: Secondary | ICD-10-CM | POA: Diagnosis not present

## 2022-05-13 DIAGNOSIS — F32A Depression, unspecified: Secondary | ICD-10-CM | POA: Diagnosis not present

## 2022-05-13 DIAGNOSIS — D649 Anemia, unspecified: Secondary | ICD-10-CM | POA: Diagnosis not present

## 2022-05-13 DIAGNOSIS — M1990 Unspecified osteoarthritis, unspecified site: Secondary | ICD-10-CM | POA: Diagnosis not present

## 2022-05-14 ENCOUNTER — Telehealth: Payer: Self-pay | Admitting: Neurology

## 2022-05-14 NOTE — Telephone Encounter (Signed)
Per Dr. Felecia Shelling, ok for her to decrease dose to 145m, 2 pills per day. I called pt and LVM letting her know this. Asked her to call back if she has any further questions or concerns.

## 2022-05-14 NOTE — Telephone Encounter (Signed)
Pt states the 356m of Gabapentin is too much, pt would like to go back to 100 mg 2 pills a day.  Please call pt to discuss.

## 2022-05-19 DIAGNOSIS — G2581 Restless legs syndrome: Secondary | ICD-10-CM | POA: Diagnosis not present

## 2022-05-19 DIAGNOSIS — Z791 Long term (current) use of non-steroidal anti-inflammatories (NSAID): Secondary | ICD-10-CM | POA: Diagnosis not present

## 2022-05-19 DIAGNOSIS — M5030 Other cervical disc degeneration, unspecified cervical region: Secondary | ICD-10-CM | POA: Diagnosis not present

## 2022-05-19 DIAGNOSIS — M5116 Intervertebral disc disorders with radiculopathy, lumbar region: Secondary | ICD-10-CM | POA: Diagnosis not present

## 2022-05-19 DIAGNOSIS — Z7951 Long term (current) use of inhaled steroids: Secondary | ICD-10-CM | POA: Diagnosis not present

## 2022-05-19 DIAGNOSIS — G4700 Insomnia, unspecified: Secondary | ICD-10-CM | POA: Diagnosis not present

## 2022-05-19 DIAGNOSIS — M1990 Unspecified osteoarthritis, unspecified site: Secondary | ICD-10-CM | POA: Diagnosis not present

## 2022-05-19 DIAGNOSIS — M48061 Spinal stenosis, lumbar region without neurogenic claudication: Secondary | ICD-10-CM | POA: Diagnosis not present

## 2022-05-19 DIAGNOSIS — F32A Depression, unspecified: Secondary | ICD-10-CM | POA: Diagnosis not present

## 2022-05-19 DIAGNOSIS — Z9181 History of falling: Secondary | ICD-10-CM | POA: Diagnosis not present

## 2022-05-19 DIAGNOSIS — K219 Gastro-esophageal reflux disease without esophagitis: Secondary | ICD-10-CM | POA: Diagnosis not present

## 2022-05-19 DIAGNOSIS — Z87891 Personal history of nicotine dependence: Secondary | ICD-10-CM | POA: Diagnosis not present

## 2022-05-19 DIAGNOSIS — I10 Essential (primary) hypertension: Secondary | ICD-10-CM | POA: Diagnosis not present

## 2022-05-19 DIAGNOSIS — Z87440 Personal history of urinary (tract) infections: Secondary | ICD-10-CM | POA: Diagnosis not present

## 2022-05-19 DIAGNOSIS — D649 Anemia, unspecified: Secondary | ICD-10-CM | POA: Diagnosis not present

## 2022-05-19 DIAGNOSIS — G35 Multiple sclerosis: Secondary | ICD-10-CM | POA: Diagnosis not present

## 2022-05-19 DIAGNOSIS — M4726 Other spondylosis with radiculopathy, lumbar region: Secondary | ICD-10-CM | POA: Diagnosis not present

## 2022-05-20 DIAGNOSIS — M797 Fibromyalgia: Secondary | ICD-10-CM | POA: Diagnosis not present

## 2022-05-20 DIAGNOSIS — G4700 Insomnia, unspecified: Secondary | ICD-10-CM | POA: Diagnosis not present

## 2022-05-20 DIAGNOSIS — I10 Essential (primary) hypertension: Secondary | ICD-10-CM | POA: Diagnosis not present

## 2022-05-20 DIAGNOSIS — E7800 Pure hypercholesterolemia, unspecified: Secondary | ICD-10-CM | POA: Diagnosis not present

## 2022-05-20 DIAGNOSIS — G35 Multiple sclerosis: Secondary | ICD-10-CM | POA: Diagnosis not present

## 2022-05-20 DIAGNOSIS — R7303 Prediabetes: Secondary | ICD-10-CM | POA: Diagnosis not present

## 2022-05-20 DIAGNOSIS — K219 Gastro-esophageal reflux disease without esophagitis: Secondary | ICD-10-CM | POA: Diagnosis not present

## 2022-05-20 DIAGNOSIS — Z9989 Dependence on other enabling machines and devices: Secondary | ICD-10-CM | POA: Diagnosis not present

## 2022-05-26 ENCOUNTER — Telehealth: Payer: Self-pay | Admitting: Neurology

## 2022-05-26 NOTE — Telephone Encounter (Signed)
Please call pt back. Please let her know we are unable to call in just 2-3 tablets for baclofen/dalfampridine per Dr. Felecia Shelling

## 2022-05-26 NOTE — Telephone Encounter (Signed)
Pt is asking if an additional 3-5 tablets of baclofen & dalfampridine can be called in.  Pt states due to the slick packaging the pills come in, should any fall out the additional 3-5 tablets would help, please call to discuss.

## 2022-05-26 NOTE — Telephone Encounter (Signed)
Called pt back. When she opens packages sometimes, pills will fall out on the floor. Worried about not getting full dose needed. Missing 2-3 tablets for each baclofen, dalfampridine.  Recommended she open packages over bigger bowl moving forward so medication will catch in bowl instead of falling on the floor.  Advised unsure if we can send just 2-3 tablets for each. Will run it by Dr. Felecia Shelling first.

## 2022-05-27 NOTE — Telephone Encounter (Signed)
Called pt and informed her that unfortunately 2-3 tablets can not be called in for those medications. I reiterated the previous message regarding opening package over a bigger bowl and pt agreed and verbalized appreciation for the call.

## 2022-05-29 ENCOUNTER — Telehealth: Payer: Self-pay | Admitting: Neurology

## 2022-05-29 DIAGNOSIS — G2581 Restless legs syndrome: Secondary | ICD-10-CM | POA: Diagnosis not present

## 2022-05-29 DIAGNOSIS — Z7951 Long term (current) use of inhaled steroids: Secondary | ICD-10-CM | POA: Diagnosis not present

## 2022-05-29 DIAGNOSIS — M1990 Unspecified osteoarthritis, unspecified site: Secondary | ICD-10-CM | POA: Diagnosis not present

## 2022-05-29 DIAGNOSIS — M4726 Other spondylosis with radiculopathy, lumbar region: Secondary | ICD-10-CM | POA: Diagnosis not present

## 2022-05-29 DIAGNOSIS — M5116 Intervertebral disc disorders with radiculopathy, lumbar region: Secondary | ICD-10-CM | POA: Diagnosis not present

## 2022-05-29 DIAGNOSIS — Z87440 Personal history of urinary (tract) infections: Secondary | ICD-10-CM | POA: Diagnosis not present

## 2022-05-29 DIAGNOSIS — F32A Depression, unspecified: Secondary | ICD-10-CM | POA: Diagnosis not present

## 2022-05-29 DIAGNOSIS — Z791 Long term (current) use of non-steroidal anti-inflammatories (NSAID): Secondary | ICD-10-CM | POA: Diagnosis not present

## 2022-05-29 DIAGNOSIS — G35 Multiple sclerosis: Secondary | ICD-10-CM | POA: Diagnosis not present

## 2022-05-29 DIAGNOSIS — D649 Anemia, unspecified: Secondary | ICD-10-CM | POA: Diagnosis not present

## 2022-05-29 DIAGNOSIS — M48061 Spinal stenosis, lumbar region without neurogenic claudication: Secondary | ICD-10-CM | POA: Diagnosis not present

## 2022-05-29 DIAGNOSIS — Z87891 Personal history of nicotine dependence: Secondary | ICD-10-CM | POA: Diagnosis not present

## 2022-05-29 DIAGNOSIS — Z9181 History of falling: Secondary | ICD-10-CM | POA: Diagnosis not present

## 2022-05-29 DIAGNOSIS — K219 Gastro-esophageal reflux disease without esophagitis: Secondary | ICD-10-CM | POA: Diagnosis not present

## 2022-05-29 DIAGNOSIS — M5030 Other cervical disc degeneration, unspecified cervical region: Secondary | ICD-10-CM | POA: Diagnosis not present

## 2022-05-29 DIAGNOSIS — G4700 Insomnia, unspecified: Secondary | ICD-10-CM | POA: Diagnosis not present

## 2022-05-29 DIAGNOSIS — I10 Essential (primary) hypertension: Secondary | ICD-10-CM | POA: Diagnosis not present

## 2022-05-29 NOTE — Telephone Encounter (Signed)
[name]) Requesting verbal orders to extend home physical therapy Frequency: 1x wk for 9 wks

## 2022-05-29 NOTE — Telephone Encounter (Signed)
Called and LVM providing VO as requested. Asked her to call back if any further questions.

## 2022-06-05 DIAGNOSIS — Z87440 Personal history of urinary (tract) infections: Secondary | ICD-10-CM | POA: Diagnosis not present

## 2022-06-05 DIAGNOSIS — G4700 Insomnia, unspecified: Secondary | ICD-10-CM | POA: Diagnosis not present

## 2022-06-05 DIAGNOSIS — G35 Multiple sclerosis: Secondary | ICD-10-CM | POA: Diagnosis not present

## 2022-06-05 DIAGNOSIS — D631 Anemia in chronic kidney disease: Secondary | ICD-10-CM | POA: Diagnosis not present

## 2022-06-05 DIAGNOSIS — F32A Depression, unspecified: Secondary | ICD-10-CM | POA: Diagnosis not present

## 2022-06-05 DIAGNOSIS — I129 Hypertensive chronic kidney disease with stage 1 through stage 4 chronic kidney disease, or unspecified chronic kidney disease: Secondary | ICD-10-CM | POA: Diagnosis not present

## 2022-06-05 DIAGNOSIS — M48061 Spinal stenosis, lumbar region without neurogenic claudication: Secondary | ICD-10-CM | POA: Diagnosis not present

## 2022-06-05 DIAGNOSIS — M5030 Other cervical disc degeneration, unspecified cervical region: Secondary | ICD-10-CM | POA: Diagnosis not present

## 2022-06-05 DIAGNOSIS — M1990 Unspecified osteoarthritis, unspecified site: Secondary | ICD-10-CM | POA: Diagnosis not present

## 2022-06-05 DIAGNOSIS — G2581 Restless legs syndrome: Secondary | ICD-10-CM | POA: Diagnosis not present

## 2022-06-05 DIAGNOSIS — K219 Gastro-esophageal reflux disease without esophagitis: Secondary | ICD-10-CM | POA: Diagnosis not present

## 2022-06-05 DIAGNOSIS — M4726 Other spondylosis with radiculopathy, lumbar region: Secondary | ICD-10-CM | POA: Diagnosis not present

## 2022-06-05 DIAGNOSIS — Z87891 Personal history of nicotine dependence: Secondary | ICD-10-CM | POA: Diagnosis not present

## 2022-06-05 DIAGNOSIS — M5116 Intervertebral disc disorders with radiculopathy, lumbar region: Secondary | ICD-10-CM | POA: Diagnosis not present

## 2022-06-05 DIAGNOSIS — N189 Chronic kidney disease, unspecified: Secondary | ICD-10-CM | POA: Diagnosis not present

## 2022-06-10 DIAGNOSIS — M1990 Unspecified osteoarthritis, unspecified site: Secondary | ICD-10-CM | POA: Diagnosis not present

## 2022-06-10 DIAGNOSIS — M4726 Other spondylosis with radiculopathy, lumbar region: Secondary | ICD-10-CM | POA: Diagnosis not present

## 2022-06-10 DIAGNOSIS — G2581 Restless legs syndrome: Secondary | ICD-10-CM | POA: Diagnosis not present

## 2022-06-10 DIAGNOSIS — M5030 Other cervical disc degeneration, unspecified cervical region: Secondary | ICD-10-CM | POA: Diagnosis not present

## 2022-06-10 DIAGNOSIS — D631 Anemia in chronic kidney disease: Secondary | ICD-10-CM | POA: Diagnosis not present

## 2022-06-10 DIAGNOSIS — N189 Chronic kidney disease, unspecified: Secondary | ICD-10-CM | POA: Diagnosis not present

## 2022-06-10 DIAGNOSIS — Z87891 Personal history of nicotine dependence: Secondary | ICD-10-CM | POA: Diagnosis not present

## 2022-06-10 DIAGNOSIS — G4700 Insomnia, unspecified: Secondary | ICD-10-CM | POA: Diagnosis not present

## 2022-06-10 DIAGNOSIS — K219 Gastro-esophageal reflux disease without esophagitis: Secondary | ICD-10-CM | POA: Diagnosis not present

## 2022-06-10 DIAGNOSIS — F32A Depression, unspecified: Secondary | ICD-10-CM | POA: Diagnosis not present

## 2022-06-10 DIAGNOSIS — M5116 Intervertebral disc disorders with radiculopathy, lumbar region: Secondary | ICD-10-CM | POA: Diagnosis not present

## 2022-06-10 DIAGNOSIS — Z87440 Personal history of urinary (tract) infections: Secondary | ICD-10-CM | POA: Diagnosis not present

## 2022-06-10 DIAGNOSIS — G35 Multiple sclerosis: Secondary | ICD-10-CM | POA: Diagnosis not present

## 2022-06-10 DIAGNOSIS — I129 Hypertensive chronic kidney disease with stage 1 through stage 4 chronic kidney disease, or unspecified chronic kidney disease: Secondary | ICD-10-CM | POA: Diagnosis not present

## 2022-06-10 DIAGNOSIS — M48061 Spinal stenosis, lumbar region without neurogenic claudication: Secondary | ICD-10-CM | POA: Diagnosis not present

## 2022-06-10 NOTE — Telephone Encounter (Addendum)
[id number] called from [facility]. Stated pt fell in the bathroom while she was trying to weight herself. No injuries was reported.

## 2022-06-19 DIAGNOSIS — G35 Multiple sclerosis: Secondary | ICD-10-CM | POA: Diagnosis not present

## 2022-06-19 DIAGNOSIS — R059 Cough, unspecified: Secondary | ICD-10-CM | POA: Diagnosis not present

## 2022-06-19 DIAGNOSIS — R1312 Dysphagia, oropharyngeal phase: Secondary | ICD-10-CM | POA: Diagnosis not present

## 2022-06-19 DIAGNOSIS — M20009 Unspecified deformity of unspecified finger(s): Secondary | ICD-10-CM | POA: Diagnosis not present

## 2022-06-20 DIAGNOSIS — G4700 Insomnia, unspecified: Secondary | ICD-10-CM | POA: Diagnosis not present

## 2022-06-20 DIAGNOSIS — D631 Anemia in chronic kidney disease: Secondary | ICD-10-CM | POA: Diagnosis not present

## 2022-06-20 DIAGNOSIS — M4726 Other spondylosis with radiculopathy, lumbar region: Secondary | ICD-10-CM | POA: Diagnosis not present

## 2022-06-20 DIAGNOSIS — K219 Gastro-esophageal reflux disease without esophagitis: Secondary | ICD-10-CM | POA: Diagnosis not present

## 2022-06-20 DIAGNOSIS — I129 Hypertensive chronic kidney disease with stage 1 through stage 4 chronic kidney disease, or unspecified chronic kidney disease: Secondary | ICD-10-CM | POA: Diagnosis not present

## 2022-06-20 DIAGNOSIS — M5116 Intervertebral disc disorders with radiculopathy, lumbar region: Secondary | ICD-10-CM | POA: Diagnosis not present

## 2022-06-20 DIAGNOSIS — N189 Chronic kidney disease, unspecified: Secondary | ICD-10-CM | POA: Diagnosis not present

## 2022-06-20 DIAGNOSIS — G2581 Restless legs syndrome: Secondary | ICD-10-CM | POA: Diagnosis not present

## 2022-06-20 DIAGNOSIS — F32A Depression, unspecified: Secondary | ICD-10-CM | POA: Diagnosis not present

## 2022-06-20 DIAGNOSIS — G35 Multiple sclerosis: Secondary | ICD-10-CM | POA: Diagnosis not present

## 2022-06-20 DIAGNOSIS — M48061 Spinal stenosis, lumbar region without neurogenic claudication: Secondary | ICD-10-CM | POA: Diagnosis not present

## 2022-06-20 DIAGNOSIS — Z87440 Personal history of urinary (tract) infections: Secondary | ICD-10-CM | POA: Diagnosis not present

## 2022-06-20 DIAGNOSIS — M5030 Other cervical disc degeneration, unspecified cervical region: Secondary | ICD-10-CM | POA: Diagnosis not present

## 2022-06-20 DIAGNOSIS — M1990 Unspecified osteoarthritis, unspecified site: Secondary | ICD-10-CM | POA: Diagnosis not present

## 2022-06-20 DIAGNOSIS — Z87891 Personal history of nicotine dependence: Secondary | ICD-10-CM | POA: Diagnosis not present

## 2022-06-25 DIAGNOSIS — M5030 Other cervical disc degeneration, unspecified cervical region: Secondary | ICD-10-CM | POA: Diagnosis not present

## 2022-06-25 DIAGNOSIS — Z87891 Personal history of nicotine dependence: Secondary | ICD-10-CM | POA: Diagnosis not present

## 2022-06-25 DIAGNOSIS — I129 Hypertensive chronic kidney disease with stage 1 through stage 4 chronic kidney disease, or unspecified chronic kidney disease: Secondary | ICD-10-CM | POA: Diagnosis not present

## 2022-06-25 DIAGNOSIS — M5116 Intervertebral disc disorders with radiculopathy, lumbar region: Secondary | ICD-10-CM | POA: Diagnosis not present

## 2022-06-25 DIAGNOSIS — Z87440 Personal history of urinary (tract) infections: Secondary | ICD-10-CM | POA: Diagnosis not present

## 2022-06-25 DIAGNOSIS — G4700 Insomnia, unspecified: Secondary | ICD-10-CM | POA: Diagnosis not present

## 2022-06-25 DIAGNOSIS — G35 Multiple sclerosis: Secondary | ICD-10-CM | POA: Diagnosis not present

## 2022-06-25 DIAGNOSIS — M1990 Unspecified osteoarthritis, unspecified site: Secondary | ICD-10-CM | POA: Diagnosis not present

## 2022-06-25 DIAGNOSIS — F32A Depression, unspecified: Secondary | ICD-10-CM | POA: Diagnosis not present

## 2022-06-25 DIAGNOSIS — N189 Chronic kidney disease, unspecified: Secondary | ICD-10-CM | POA: Diagnosis not present

## 2022-06-25 DIAGNOSIS — D631 Anemia in chronic kidney disease: Secondary | ICD-10-CM | POA: Diagnosis not present

## 2022-06-25 DIAGNOSIS — G2581 Restless legs syndrome: Secondary | ICD-10-CM | POA: Diagnosis not present

## 2022-06-25 DIAGNOSIS — M4726 Other spondylosis with radiculopathy, lumbar region: Secondary | ICD-10-CM | POA: Diagnosis not present

## 2022-06-25 DIAGNOSIS — M48061 Spinal stenosis, lumbar region without neurogenic claudication: Secondary | ICD-10-CM | POA: Diagnosis not present

## 2022-06-25 DIAGNOSIS — K219 Gastro-esophageal reflux disease without esophagitis: Secondary | ICD-10-CM | POA: Diagnosis not present

## 2022-07-03 ENCOUNTER — Other Ambulatory Visit: Payer: Self-pay | Admitting: Neurology

## 2022-07-04 DIAGNOSIS — K219 Gastro-esophageal reflux disease without esophagitis: Secondary | ICD-10-CM | POA: Diagnosis not present

## 2022-07-04 DIAGNOSIS — M5030 Other cervical disc degeneration, unspecified cervical region: Secondary | ICD-10-CM | POA: Diagnosis not present

## 2022-07-04 DIAGNOSIS — G4700 Insomnia, unspecified: Secondary | ICD-10-CM | POA: Diagnosis not present

## 2022-07-04 DIAGNOSIS — F32A Depression, unspecified: Secondary | ICD-10-CM | POA: Diagnosis not present

## 2022-07-04 DIAGNOSIS — G35 Multiple sclerosis: Secondary | ICD-10-CM | POA: Diagnosis not present

## 2022-07-04 DIAGNOSIS — D631 Anemia in chronic kidney disease: Secondary | ICD-10-CM | POA: Diagnosis not present

## 2022-07-04 DIAGNOSIS — Z87891 Personal history of nicotine dependence: Secondary | ICD-10-CM | POA: Diagnosis not present

## 2022-07-04 DIAGNOSIS — M1990 Unspecified osteoarthritis, unspecified site: Secondary | ICD-10-CM | POA: Diagnosis not present

## 2022-07-04 DIAGNOSIS — M5116 Intervertebral disc disorders with radiculopathy, lumbar region: Secondary | ICD-10-CM | POA: Diagnosis not present

## 2022-07-04 DIAGNOSIS — N189 Chronic kidney disease, unspecified: Secondary | ICD-10-CM | POA: Diagnosis not present

## 2022-07-04 DIAGNOSIS — I129 Hypertensive chronic kidney disease with stage 1 through stage 4 chronic kidney disease, or unspecified chronic kidney disease: Secondary | ICD-10-CM | POA: Diagnosis not present

## 2022-07-04 DIAGNOSIS — Z87440 Personal history of urinary (tract) infections: Secondary | ICD-10-CM | POA: Diagnosis not present

## 2022-07-04 DIAGNOSIS — G2581 Restless legs syndrome: Secondary | ICD-10-CM | POA: Diagnosis not present

## 2022-07-04 DIAGNOSIS — M4726 Other spondylosis with radiculopathy, lumbar region: Secondary | ICD-10-CM | POA: Diagnosis not present

## 2022-07-04 DIAGNOSIS — M48061 Spinal stenosis, lumbar region without neurogenic claudication: Secondary | ICD-10-CM | POA: Diagnosis not present

## 2022-07-08 DIAGNOSIS — G2581 Restless legs syndrome: Secondary | ICD-10-CM | POA: Diagnosis not present

## 2022-07-08 DIAGNOSIS — G4700 Insomnia, unspecified: Secondary | ICD-10-CM | POA: Diagnosis not present

## 2022-07-08 DIAGNOSIS — F32A Depression, unspecified: Secondary | ICD-10-CM | POA: Diagnosis not present

## 2022-07-08 DIAGNOSIS — M1990 Unspecified osteoarthritis, unspecified site: Secondary | ICD-10-CM | POA: Diagnosis not present

## 2022-07-08 DIAGNOSIS — G35 Multiple sclerosis: Secondary | ICD-10-CM | POA: Diagnosis not present

## 2022-07-08 DIAGNOSIS — D631 Anemia in chronic kidney disease: Secondary | ICD-10-CM | POA: Diagnosis not present

## 2022-07-08 DIAGNOSIS — Z87440 Personal history of urinary (tract) infections: Secondary | ICD-10-CM | POA: Diagnosis not present

## 2022-07-08 DIAGNOSIS — M5116 Intervertebral disc disorders with radiculopathy, lumbar region: Secondary | ICD-10-CM | POA: Diagnosis not present

## 2022-07-08 DIAGNOSIS — M4726 Other spondylosis with radiculopathy, lumbar region: Secondary | ICD-10-CM | POA: Diagnosis not present

## 2022-07-08 DIAGNOSIS — N189 Chronic kidney disease, unspecified: Secondary | ICD-10-CM | POA: Diagnosis not present

## 2022-07-08 DIAGNOSIS — M48061 Spinal stenosis, lumbar region without neurogenic claudication: Secondary | ICD-10-CM | POA: Diagnosis not present

## 2022-07-08 DIAGNOSIS — Z87891 Personal history of nicotine dependence: Secondary | ICD-10-CM | POA: Diagnosis not present

## 2022-07-08 DIAGNOSIS — M5030 Other cervical disc degeneration, unspecified cervical region: Secondary | ICD-10-CM | POA: Diagnosis not present

## 2022-07-08 DIAGNOSIS — K219 Gastro-esophageal reflux disease without esophagitis: Secondary | ICD-10-CM | POA: Diagnosis not present

## 2022-07-08 DIAGNOSIS — I129 Hypertensive chronic kidney disease with stage 1 through stage 4 chronic kidney disease, or unspecified chronic kidney disease: Secondary | ICD-10-CM | POA: Diagnosis not present

## 2022-07-14 DIAGNOSIS — M79642 Pain in left hand: Secondary | ICD-10-CM | POA: Diagnosis not present

## 2022-07-14 DIAGNOSIS — M20009 Unspecified deformity of unspecified finger(s): Secondary | ICD-10-CM | POA: Diagnosis not present

## 2022-07-14 DIAGNOSIS — M79641 Pain in right hand: Secondary | ICD-10-CM | POA: Diagnosis not present

## 2022-07-15 DIAGNOSIS — M25571 Pain in right ankle and joints of right foot: Secondary | ICD-10-CM | POA: Diagnosis not present

## 2022-07-15 DIAGNOSIS — M25471 Effusion, right ankle: Secondary | ICD-10-CM | POA: Diagnosis not present

## 2022-07-15 DIAGNOSIS — Z9181 History of falling: Secondary | ICD-10-CM | POA: Diagnosis not present

## 2022-07-17 DIAGNOSIS — M1990 Unspecified osteoarthritis, unspecified site: Secondary | ICD-10-CM | POA: Diagnosis not present

## 2022-07-17 DIAGNOSIS — M5030 Other cervical disc degeneration, unspecified cervical region: Secondary | ICD-10-CM | POA: Diagnosis not present

## 2022-07-17 DIAGNOSIS — F32A Depression, unspecified: Secondary | ICD-10-CM | POA: Diagnosis not present

## 2022-07-17 DIAGNOSIS — M5116 Intervertebral disc disorders with radiculopathy, lumbar region: Secondary | ICD-10-CM | POA: Diagnosis not present

## 2022-07-17 DIAGNOSIS — Z87891 Personal history of nicotine dependence: Secondary | ICD-10-CM | POA: Diagnosis not present

## 2022-07-17 DIAGNOSIS — Z87440 Personal history of urinary (tract) infections: Secondary | ICD-10-CM | POA: Diagnosis not present

## 2022-07-17 DIAGNOSIS — D631 Anemia in chronic kidney disease: Secondary | ICD-10-CM | POA: Diagnosis not present

## 2022-07-17 DIAGNOSIS — M4726 Other spondylosis with radiculopathy, lumbar region: Secondary | ICD-10-CM | POA: Diagnosis not present

## 2022-07-17 DIAGNOSIS — K219 Gastro-esophageal reflux disease without esophagitis: Secondary | ICD-10-CM | POA: Diagnosis not present

## 2022-07-17 DIAGNOSIS — I129 Hypertensive chronic kidney disease with stage 1 through stage 4 chronic kidney disease, or unspecified chronic kidney disease: Secondary | ICD-10-CM | POA: Diagnosis not present

## 2022-07-17 DIAGNOSIS — G35 Multiple sclerosis: Secondary | ICD-10-CM | POA: Diagnosis not present

## 2022-07-17 DIAGNOSIS — M48061 Spinal stenosis, lumbar region without neurogenic claudication: Secondary | ICD-10-CM | POA: Diagnosis not present

## 2022-07-17 DIAGNOSIS — N189 Chronic kidney disease, unspecified: Secondary | ICD-10-CM | POA: Diagnosis not present

## 2022-07-17 DIAGNOSIS — G2581 Restless legs syndrome: Secondary | ICD-10-CM | POA: Diagnosis not present

## 2022-07-17 DIAGNOSIS — G4700 Insomnia, unspecified: Secondary | ICD-10-CM | POA: Diagnosis not present

## 2022-07-22 DIAGNOSIS — F32A Depression, unspecified: Secondary | ICD-10-CM | POA: Diagnosis not present

## 2022-07-22 DIAGNOSIS — I129 Hypertensive chronic kidney disease with stage 1 through stage 4 chronic kidney disease, or unspecified chronic kidney disease: Secondary | ICD-10-CM | POA: Diagnosis not present

## 2022-07-22 DIAGNOSIS — G2581 Restless legs syndrome: Secondary | ICD-10-CM | POA: Diagnosis not present

## 2022-07-22 DIAGNOSIS — D631 Anemia in chronic kidney disease: Secondary | ICD-10-CM | POA: Diagnosis not present

## 2022-07-22 DIAGNOSIS — Z87891 Personal history of nicotine dependence: Secondary | ICD-10-CM | POA: Diagnosis not present

## 2022-07-22 DIAGNOSIS — N189 Chronic kidney disease, unspecified: Secondary | ICD-10-CM | POA: Diagnosis not present

## 2022-07-22 DIAGNOSIS — G35 Multiple sclerosis: Secondary | ICD-10-CM | POA: Diagnosis not present

## 2022-07-22 DIAGNOSIS — G4700 Insomnia, unspecified: Secondary | ICD-10-CM | POA: Diagnosis not present

## 2022-07-22 DIAGNOSIS — M48061 Spinal stenosis, lumbar region without neurogenic claudication: Secondary | ICD-10-CM | POA: Diagnosis not present

## 2022-07-22 DIAGNOSIS — M4726 Other spondylosis with radiculopathy, lumbar region: Secondary | ICD-10-CM | POA: Diagnosis not present

## 2022-07-22 DIAGNOSIS — K219 Gastro-esophageal reflux disease without esophagitis: Secondary | ICD-10-CM | POA: Diagnosis not present

## 2022-07-22 DIAGNOSIS — M5030 Other cervical disc degeneration, unspecified cervical region: Secondary | ICD-10-CM | POA: Diagnosis not present

## 2022-07-22 DIAGNOSIS — M1990 Unspecified osteoarthritis, unspecified site: Secondary | ICD-10-CM | POA: Diagnosis not present

## 2022-07-22 DIAGNOSIS — Z87440 Personal history of urinary (tract) infections: Secondary | ICD-10-CM | POA: Diagnosis not present

## 2022-07-22 DIAGNOSIS — M5116 Intervertebral disc disorders with radiculopathy, lumbar region: Secondary | ICD-10-CM | POA: Diagnosis not present

## 2022-07-29 DIAGNOSIS — F32A Depression, unspecified: Secondary | ICD-10-CM | POA: Diagnosis not present

## 2022-07-29 DIAGNOSIS — M4726 Other spondylosis with radiculopathy, lumbar region: Secondary | ICD-10-CM | POA: Diagnosis not present

## 2022-07-29 DIAGNOSIS — N189 Chronic kidney disease, unspecified: Secondary | ICD-10-CM | POA: Diagnosis not present

## 2022-07-29 DIAGNOSIS — M48061 Spinal stenosis, lumbar region without neurogenic claudication: Secondary | ICD-10-CM | POA: Diagnosis not present

## 2022-07-29 DIAGNOSIS — K219 Gastro-esophageal reflux disease without esophagitis: Secondary | ICD-10-CM | POA: Diagnosis not present

## 2022-07-29 DIAGNOSIS — M5030 Other cervical disc degeneration, unspecified cervical region: Secondary | ICD-10-CM | POA: Diagnosis not present

## 2022-07-29 DIAGNOSIS — M1990 Unspecified osteoarthritis, unspecified site: Secondary | ICD-10-CM | POA: Diagnosis not present

## 2022-07-29 DIAGNOSIS — M5116 Intervertebral disc disorders with radiculopathy, lumbar region: Secondary | ICD-10-CM | POA: Diagnosis not present

## 2022-07-29 DIAGNOSIS — I129 Hypertensive chronic kidney disease with stage 1 through stage 4 chronic kidney disease, or unspecified chronic kidney disease: Secondary | ICD-10-CM | POA: Diagnosis not present

## 2022-07-29 DIAGNOSIS — G2581 Restless legs syndrome: Secondary | ICD-10-CM | POA: Diagnosis not present

## 2022-07-29 DIAGNOSIS — Z87891 Personal history of nicotine dependence: Secondary | ICD-10-CM | POA: Diagnosis not present

## 2022-07-29 DIAGNOSIS — G4700 Insomnia, unspecified: Secondary | ICD-10-CM | POA: Diagnosis not present

## 2022-07-29 DIAGNOSIS — Z87440 Personal history of urinary (tract) infections: Secondary | ICD-10-CM | POA: Diagnosis not present

## 2022-07-29 DIAGNOSIS — D631 Anemia in chronic kidney disease: Secondary | ICD-10-CM | POA: Diagnosis not present

## 2022-07-29 DIAGNOSIS — G35 Multiple sclerosis: Secondary | ICD-10-CM | POA: Diagnosis not present

## 2022-07-30 ENCOUNTER — Telehealth: Payer: Self-pay | Admitting: Neurology

## 2022-07-30 ENCOUNTER — Other Ambulatory Visit: Payer: Self-pay | Admitting: Neurology

## 2022-07-30 DIAGNOSIS — R3915 Urgency of urination: Secondary | ICD-10-CM

## 2022-07-30 DIAGNOSIS — R350 Frequency of micturition: Secondary | ICD-10-CM

## 2022-07-30 NOTE — Telephone Encounter (Signed)
Tresa Endo, PT @ [facility] is seeking verbal orders for Home PT for patient.  1 week 4.  [facility]# [phone] secure vm

## 2022-07-30 NOTE — Telephone Encounter (Signed)
LVM with Verbal Order for PT

## 2022-07-31 NOTE — Telephone Encounter (Signed)
Last seen on 04/03/22 per note " Leslye Peer helps her urinary frequency/urgency, She has depression and anxiety and is on combination of sertraline and buspar.   Follow up scheduled on 01/08/23

## 2022-08-08 DIAGNOSIS — K219 Gastro-esophageal reflux disease without esophagitis: Secondary | ICD-10-CM | POA: Diagnosis not present

## 2022-08-08 DIAGNOSIS — G35 Multiple sclerosis: Secondary | ICD-10-CM | POA: Diagnosis not present

## 2022-08-08 DIAGNOSIS — Z87440 Personal history of urinary (tract) infections: Secondary | ICD-10-CM | POA: Diagnosis not present

## 2022-08-08 DIAGNOSIS — M5030 Other cervical disc degeneration, unspecified cervical region: Secondary | ICD-10-CM | POA: Diagnosis not present

## 2022-08-08 DIAGNOSIS — M48061 Spinal stenosis, lumbar region without neurogenic claudication: Secondary | ICD-10-CM | POA: Diagnosis not present

## 2022-08-08 DIAGNOSIS — M4726 Other spondylosis with radiculopathy, lumbar region: Secondary | ICD-10-CM | POA: Diagnosis not present

## 2022-08-08 DIAGNOSIS — D631 Anemia in chronic kidney disease: Secondary | ICD-10-CM | POA: Diagnosis not present

## 2022-08-08 DIAGNOSIS — N189 Chronic kidney disease, unspecified: Secondary | ICD-10-CM | POA: Diagnosis not present

## 2022-08-08 DIAGNOSIS — I129 Hypertensive chronic kidney disease with stage 1 through stage 4 chronic kidney disease, or unspecified chronic kidney disease: Secondary | ICD-10-CM | POA: Diagnosis not present

## 2022-08-08 DIAGNOSIS — Z87891 Personal history of nicotine dependence: Secondary | ICD-10-CM | POA: Diagnosis not present

## 2022-08-08 DIAGNOSIS — M5116 Intervertebral disc disorders with radiculopathy, lumbar region: Secondary | ICD-10-CM | POA: Diagnosis not present

## 2022-08-08 DIAGNOSIS — F32A Depression, unspecified: Secondary | ICD-10-CM | POA: Diagnosis not present

## 2022-08-08 DIAGNOSIS — G4700 Insomnia, unspecified: Secondary | ICD-10-CM | POA: Diagnosis not present

## 2022-08-08 DIAGNOSIS — M1990 Unspecified osteoarthritis, unspecified site: Secondary | ICD-10-CM | POA: Diagnosis not present

## 2022-08-08 DIAGNOSIS — G2581 Restless legs syndrome: Secondary | ICD-10-CM | POA: Diagnosis not present

## 2022-08-15 DIAGNOSIS — N189 Chronic kidney disease, unspecified: Secondary | ICD-10-CM | POA: Diagnosis not present

## 2022-08-15 DIAGNOSIS — G2581 Restless legs syndrome: Secondary | ICD-10-CM | POA: Diagnosis not present

## 2022-08-15 DIAGNOSIS — Z87440 Personal history of urinary (tract) infections: Secondary | ICD-10-CM | POA: Diagnosis not present

## 2022-08-15 DIAGNOSIS — M5116 Intervertebral disc disorders with radiculopathy, lumbar region: Secondary | ICD-10-CM | POA: Diagnosis not present

## 2022-08-15 DIAGNOSIS — G4700 Insomnia, unspecified: Secondary | ICD-10-CM | POA: Diagnosis not present

## 2022-08-15 DIAGNOSIS — I129 Hypertensive chronic kidney disease with stage 1 through stage 4 chronic kidney disease, or unspecified chronic kidney disease: Secondary | ICD-10-CM | POA: Diagnosis not present

## 2022-08-15 DIAGNOSIS — M4726 Other spondylosis with radiculopathy, lumbar region: Secondary | ICD-10-CM | POA: Diagnosis not present

## 2022-08-15 DIAGNOSIS — M5030 Other cervical disc degeneration, unspecified cervical region: Secondary | ICD-10-CM | POA: Diagnosis not present

## 2022-08-15 DIAGNOSIS — D631 Anemia in chronic kidney disease: Secondary | ICD-10-CM | POA: Diagnosis not present

## 2022-08-15 DIAGNOSIS — M48061 Spinal stenosis, lumbar region without neurogenic claudication: Secondary | ICD-10-CM | POA: Diagnosis not present

## 2022-08-15 DIAGNOSIS — G35 Multiple sclerosis: Secondary | ICD-10-CM | POA: Diagnosis not present

## 2022-08-15 DIAGNOSIS — M1990 Unspecified osteoarthritis, unspecified site: Secondary | ICD-10-CM | POA: Diagnosis not present

## 2022-08-15 DIAGNOSIS — F32A Depression, unspecified: Secondary | ICD-10-CM | POA: Diagnosis not present

## 2022-08-15 DIAGNOSIS — Z87891 Personal history of nicotine dependence: Secondary | ICD-10-CM | POA: Diagnosis not present

## 2022-08-15 DIAGNOSIS — K219 Gastro-esophageal reflux disease without esophagitis: Secondary | ICD-10-CM | POA: Diagnosis not present

## 2022-08-21 DIAGNOSIS — M5030 Other cervical disc degeneration, unspecified cervical region: Secondary | ICD-10-CM | POA: Diagnosis not present

## 2022-08-21 DIAGNOSIS — M5116 Intervertebral disc disorders with radiculopathy, lumbar region: Secondary | ICD-10-CM | POA: Diagnosis not present

## 2022-08-21 DIAGNOSIS — Z87891 Personal history of nicotine dependence: Secondary | ICD-10-CM | POA: Diagnosis not present

## 2022-08-21 DIAGNOSIS — G35 Multiple sclerosis: Secondary | ICD-10-CM | POA: Diagnosis not present

## 2022-08-21 DIAGNOSIS — D631 Anemia in chronic kidney disease: Secondary | ICD-10-CM | POA: Diagnosis not present

## 2022-08-21 DIAGNOSIS — M1990 Unspecified osteoarthritis, unspecified site: Secondary | ICD-10-CM | POA: Diagnosis not present

## 2022-08-21 DIAGNOSIS — G2581 Restless legs syndrome: Secondary | ICD-10-CM | POA: Diagnosis not present

## 2022-08-21 DIAGNOSIS — I129 Hypertensive chronic kidney disease with stage 1 through stage 4 chronic kidney disease, or unspecified chronic kidney disease: Secondary | ICD-10-CM | POA: Diagnosis not present

## 2022-08-21 DIAGNOSIS — M48061 Spinal stenosis, lumbar region without neurogenic claudication: Secondary | ICD-10-CM | POA: Diagnosis not present

## 2022-08-21 DIAGNOSIS — N189 Chronic kidney disease, unspecified: Secondary | ICD-10-CM | POA: Diagnosis not present

## 2022-08-21 DIAGNOSIS — Z87440 Personal history of urinary (tract) infections: Secondary | ICD-10-CM | POA: Diagnosis not present

## 2022-08-21 DIAGNOSIS — F32A Depression, unspecified: Secondary | ICD-10-CM | POA: Diagnosis not present

## 2022-08-21 DIAGNOSIS — K219 Gastro-esophageal reflux disease without esophagitis: Secondary | ICD-10-CM | POA: Diagnosis not present

## 2022-08-21 DIAGNOSIS — M4726 Other spondylosis with radiculopathy, lumbar region: Secondary | ICD-10-CM | POA: Diagnosis not present

## 2022-08-21 DIAGNOSIS — G4700 Insomnia, unspecified: Secondary | ICD-10-CM | POA: Diagnosis not present

## 2022-08-22 DIAGNOSIS — S82831A Other fracture of upper and lower end of right fibula, initial encounter for closed fracture: Secondary | ICD-10-CM | POA: Diagnosis not present

## 2022-08-29 DIAGNOSIS — N189 Chronic kidney disease, unspecified: Secondary | ICD-10-CM | POA: Diagnosis not present

## 2022-08-29 DIAGNOSIS — Z87440 Personal history of urinary (tract) infections: Secondary | ICD-10-CM | POA: Diagnosis not present

## 2022-08-29 DIAGNOSIS — M5116 Intervertebral disc disorders with radiculopathy, lumbar region: Secondary | ICD-10-CM | POA: Diagnosis not present

## 2022-08-29 DIAGNOSIS — D631 Anemia in chronic kidney disease: Secondary | ICD-10-CM | POA: Diagnosis not present

## 2022-08-29 DIAGNOSIS — K219 Gastro-esophageal reflux disease without esophagitis: Secondary | ICD-10-CM | POA: Diagnosis not present

## 2022-08-29 DIAGNOSIS — I129 Hypertensive chronic kidney disease with stage 1 through stage 4 chronic kidney disease, or unspecified chronic kidney disease: Secondary | ICD-10-CM | POA: Diagnosis not present

## 2022-08-29 DIAGNOSIS — M1990 Unspecified osteoarthritis, unspecified site: Secondary | ICD-10-CM | POA: Diagnosis not present

## 2022-08-29 DIAGNOSIS — F32A Depression, unspecified: Secondary | ICD-10-CM | POA: Diagnosis not present

## 2022-08-29 DIAGNOSIS — M48061 Spinal stenosis, lumbar region without neurogenic claudication: Secondary | ICD-10-CM | POA: Diagnosis not present

## 2022-08-29 DIAGNOSIS — G35 Multiple sclerosis: Secondary | ICD-10-CM | POA: Diagnosis not present

## 2022-08-29 DIAGNOSIS — G2581 Restless legs syndrome: Secondary | ICD-10-CM | POA: Diagnosis not present

## 2022-08-29 DIAGNOSIS — Z87891 Personal history of nicotine dependence: Secondary | ICD-10-CM | POA: Diagnosis not present

## 2022-08-29 DIAGNOSIS — G4700 Insomnia, unspecified: Secondary | ICD-10-CM | POA: Diagnosis not present

## 2022-08-29 DIAGNOSIS — M5030 Other cervical disc degeneration, unspecified cervical region: Secondary | ICD-10-CM | POA: Diagnosis not present

## 2022-08-29 DIAGNOSIS — M4726 Other spondylosis with radiculopathy, lumbar region: Secondary | ICD-10-CM | POA: Diagnosis not present

## 2022-09-03 DIAGNOSIS — G5601 Carpal tunnel syndrome, right upper limb: Secondary | ICD-10-CM | POA: Diagnosis not present

## 2022-09-03 DIAGNOSIS — M79642 Pain in left hand: Secondary | ICD-10-CM | POA: Diagnosis not present

## 2022-09-03 DIAGNOSIS — M79641 Pain in right hand: Secondary | ICD-10-CM | POA: Diagnosis not present

## 2022-09-16 ENCOUNTER — Telehealth: Payer: Self-pay | Admitting: Neurology

## 2022-09-16 DIAGNOSIS — M25552 Pain in left hip: Secondary | ICD-10-CM

## 2022-09-16 DIAGNOSIS — G35 Multiple sclerosis: Secondary | ICD-10-CM

## 2022-09-16 DIAGNOSIS — M79605 Pain in left leg: Secondary | ICD-10-CM

## 2022-09-16 DIAGNOSIS — R269 Unspecified abnormalities of gait and mobility: Secondary | ICD-10-CM

## 2022-09-16 NOTE — Telephone Encounter (Signed)
Called pt back. Pt reports increased pain in legs/hips. Sx started in the last couple weeks. Denies any injuries prior to sx starting. This is waking her up at night. Tried taking tramadol prn but only helps some. Does not feel baclofen 10mg  po TID is helping.No signs of illness/infection currently.  Updated allergy list, medication list and pharmacy w/ pt.  Last appt: 04/03/22. Next f/u: 01/08/23 at 1:00pm.  Aware I will discus with MD and call back.  Total time of call: 21 min

## 2022-09-16 NOTE — Telephone Encounter (Signed)
I called patient and relayed message. She decided to hold off on home health PT at the moment. Did not want referral to outpt PT. She did not want to schedule appt at this time. Will keep upcoming appt in Oct as scheduled.

## 2022-09-16 NOTE — Telephone Encounter (Signed)
Pt called wanting to discuss her medications due to the pain she is experiencing at the moment. Please advise.

## 2022-09-16 NOTE — Telephone Encounter (Signed)
[facility] is not able to take her, neither is [facility].  [facility] would be able to take her but sent this message: Hi Tori--she will need a F2F - she has not been seen by a provider there for some time--a telephone encounter will not work - insurance does not accept a telephone encounter as a valid F2F in order to cover her therapy under home health. I see she has an upcoming appt in October - and that is too far out for her to be able to use that as a F2F date. It has to be 90 days prior to order and that visit has to document the need for the home health or it has to be with in 30 days from ordering the home health - she will need to have an appt made to see the provider soon.

## 2022-09-16 NOTE — Telephone Encounter (Signed)
Spoke w/ Dr. Epimenio Foot. He does not want to make any medication adjustments at this time. Recommending referral to physical therapy.   I called pt and relayed message. She is agreeable to plan. She would like to use [facility] again. I placed referral.

## 2022-09-22 ENCOUNTER — Telehealth: Payer: Self-pay | Admitting: Neurology

## 2022-09-22 ENCOUNTER — Other Ambulatory Visit: Payer: Self-pay | Admitting: Neurology

## 2022-09-22 MED ORDER — GABAPENTIN 300 MG PO CAPS: ORAL_CAPSULE | ORAL | 11 refills | Status: DC

## 2022-09-22 NOTE — Telephone Encounter (Signed)
Called pt and let her know that her prescription has been sent to her [facility] on today 09/22/2022. Pt verbalized understanding.

## 2022-09-22 NOTE — Telephone Encounter (Signed)
Pt called to confirm Gabapentin has been send to   [facility]

## 2022-09-22 NOTE — Telephone Encounter (Signed)
Pt said, I have found the medication have been taking, Ropinirole.

## 2022-09-22 NOTE — Telephone Encounter (Addendum)
Called pt and she stated that she is in a lot of pain and is unable to get comfortable at night and has to sleep in a recliner. Pt states that she feels like something is squeezing her legs tighter and tighter. Asked pt if she was taking her Gabapentin as prescribed, pt stated that she is taking 2 caps 3x daily and she doesn't see any changes. Pt states she doesn't want any more medication. Told pt that Dr. Epimenio Foot will be notified about this manner and we will get back in contact with her.   Pt called back and let us know that she is taking her Requip as prescribed.

## 2022-09-22 NOTE — Telephone Encounter (Signed)
Pt said having leg and ankle pain, burning in both legs. Would like a call from the nurse to discuss if there is something can be done.

## 2022-09-22 NOTE — Telephone Encounter (Signed)
Noted  

## 2022-09-23 DIAGNOSIS — G35 Multiple sclerosis: Secondary | ICD-10-CM | POA: Diagnosis not present

## 2022-09-23 DIAGNOSIS — K219 Gastro-esophageal reflux disease without esophagitis: Secondary | ICD-10-CM | POA: Diagnosis not present

## 2022-09-23 DIAGNOSIS — M6289 Other specified disorders of muscle: Secondary | ICD-10-CM | POA: Diagnosis not present

## 2022-09-23 NOTE — Telephone Encounter (Signed)
Pt calling about her gabapentin , she was increased 300mg  from BID to TID.  Prescription was sent in 1651 09-22-2022 with [facility].  I called and spoke to [place] at [facility], they did receive the prescription, pt should get vial of new caps to make the TID dosing and usually delivered to her the next day.   Her next pill pack, (due next month will have new doses in the pack).  Pt at her pcp, now, I relayed this to her and she will be looking for a call from [facility] today ro tomorrow.  She did verbalized understanding, although she has some memory issues. I reiterated plan again to take gabapentin 300mg  po tid dosing.

## 2022-10-13 MED ORDER — GABAPENTIN 300 MG PO CAPS: 300.0000 mg | ORAL_CAPSULE | Freq: Every day | ORAL | 11 refills | Status: DC

## 2022-10-13 NOTE — Telephone Encounter (Signed)
Returned call to pt who stated she has pain from waist down rated 9/10. She stated she hasn't missed and gabapentin according to her pill pack system and that she  took tylenol 225mg  1 tab tid andn that is not helping. She stated that she is in tremendous pain. Legs are burning, tingly and even going numb at times. She cannot sit down as that hurts more. She wants to know if anything can be done medication wise to ease the pain.

## 2022-10-13 NOTE — Addendum Note (Signed)
Addended by: Danne Harbor on: 10/13/2022 04:53 PM   Modules accepted: Orders

## 2022-10-13 NOTE — Telephone Encounter (Signed)
Pt reports she is having a lot of pain in her legs and thighs, she is taking a lot of tylenol but it helps only a little bit, please call.

## 2022-10-13 NOTE — Addendum Note (Signed)
Addended by: Asa Lente on: 10/13/2022 05:15 PM   Modules accepted: Orders

## 2022-10-13 NOTE — Telephone Encounter (Signed)
Pt agreeable to 5 pills of gabapentin per day  Requested Prescriptions   Pending Prescriptions Disp Refills   gabapentin (NEURONTIN) 300 MG capsule 90 capsule 11    Sig: Take 1 capsule (300 mg total) by mouth 5 (five) times daily. One po tid   Routing for provider to fill

## 2022-10-14 ENCOUNTER — Telehealth: Payer: Self-pay | Admitting: Neurology

## 2022-10-14 MED ORDER — GABAPENTIN 300 MG PO CAPS: 300.0000 mg | ORAL_CAPSULE | Freq: Every day | ORAL | 11 refills | Status: DC

## 2022-10-14 NOTE — Telephone Encounter (Signed)
Chasty called from [facility]. Stated she needs to clarify prescription for  gabapentin (NEURONTIN) 300 MG capsule. She is requesting a call back from nurse.

## 2022-10-14 NOTE — Telephone Encounter (Signed)
Dr.Sater said okay to signing, placed in in basket to sign.

## 2022-10-14 NOTE — Telephone Encounter (Signed)
Requested Prescriptions   Pending Prescriptions Disp Refills   gabapentin (NEURONTIN) 300 MG capsule 150 capsule 11    Sig: Take 1 capsule (300 mg total) by mouth 5 (five) times daily. One po tid   Refill sent w/updated rx

## 2022-10-14 NOTE — Telephone Encounter (Signed)
Pt stated she needs a Handicap form filled out.

## 2022-10-15 DIAGNOSIS — M19041 Primary osteoarthritis, right hand: Secondary | ICD-10-CM | POA: Diagnosis not present

## 2022-10-15 DIAGNOSIS — M1811 Unilateral primary osteoarthritis of first carpometacarpal joint, right hand: Secondary | ICD-10-CM | POA: Diagnosis not present

## 2022-10-15 DIAGNOSIS — G5601 Carpal tunnel syndrome, right upper limb: Secondary | ICD-10-CM | POA: Diagnosis not present

## 2022-10-16 NOTE — Telephone Encounter (Signed)
Mailed to patient home address per request.

## 2022-10-20 NOTE — Telephone Encounter (Signed)
Called pt back and she stated taking the Gabapentin 300mg  5 times per day keeps her in the bed all day and that she wants to take it 3 times per day instead. Told pt to try it out and if she is still feeling tired to please give [place] a call back.

## 2022-10-20 NOTE — Telephone Encounter (Signed)
Pt is asking for a call to discuss only taking the  gabapentin (NEURONTIN) 300 MG capsule 3 times a day and not 5

## 2022-11-12 DIAGNOSIS — G35 Multiple sclerosis: Secondary | ICD-10-CM | POA: Diagnosis not present

## 2022-11-12 DIAGNOSIS — H4720 Unspecified optic atrophy: Secondary | ICD-10-CM | POA: Diagnosis not present

## 2022-11-12 DIAGNOSIS — Z961 Presence of intraocular lens: Secondary | ICD-10-CM | POA: Diagnosis not present

## 2022-11-13 ENCOUNTER — Telehealth: Payer: Self-pay | Admitting: Neurology

## 2022-11-13 ENCOUNTER — Other Ambulatory Visit: Payer: Self-pay

## 2022-11-13 NOTE — Telephone Encounter (Signed)
LVM for pt to call back.

## 2022-11-13 NOTE — Telephone Encounter (Signed)
Pt called stating that [facility] is going out of business and they have not faxed over the information for her gabapentin (NEURONTIN) 300 MG capsule to [facility] so she is needing a new Rx sent to them. Please advise.

## 2022-11-18 MED ORDER — GABAPENTIN 300 MG PO CAPS: 300.0000 mg | ORAL_CAPSULE | Freq: Every day | ORAL | 11 refills | Status: DC

## 2022-11-18 NOTE — Addendum Note (Signed)
Addended by: Judi Cong on: 11/18/2022 12:24 PM   Modules accepted: Orders

## 2022-11-18 NOTE — Telephone Encounter (Signed)
Updated script for the patient sent to [facility] as requested

## 2022-11-23 ENCOUNTER — Other Ambulatory Visit: Payer: Self-pay | Admitting: Neurology

## 2022-11-25 NOTE — Telephone Encounter (Signed)
Last seen on 04/03/22 Follow up scheduled on 01/08/23   Message attached to Rx "This prescription was filled on 11/03/2022. Any refills authorized will be placed on file."

## 2022-12-08 DIAGNOSIS — G35 Multiple sclerosis: Secondary | ICD-10-CM | POA: Diagnosis not present

## 2022-12-08 DIAGNOSIS — K5900 Constipation, unspecified: Secondary | ICD-10-CM | POA: Diagnosis not present

## 2022-12-08 DIAGNOSIS — Z23 Encounter for immunization: Secondary | ICD-10-CM | POA: Diagnosis not present

## 2022-12-08 DIAGNOSIS — E7800 Pure hypercholesterolemia, unspecified: Secondary | ICD-10-CM | POA: Diagnosis not present

## 2022-12-08 DIAGNOSIS — K219 Gastro-esophageal reflux disease without esophagitis: Secondary | ICD-10-CM | POA: Diagnosis not present

## 2022-12-08 DIAGNOSIS — M797 Fibromyalgia: Secondary | ICD-10-CM | POA: Diagnosis not present

## 2022-12-08 DIAGNOSIS — Z0000 Encounter for general adult medical examination without abnormal findings: Secondary | ICD-10-CM | POA: Diagnosis not present

## 2022-12-08 DIAGNOSIS — I10 Essential (primary) hypertension: Secondary | ICD-10-CM | POA: Diagnosis not present

## 2022-12-08 DIAGNOSIS — G4700 Insomnia, unspecified: Secondary | ICD-10-CM | POA: Diagnosis not present

## 2022-12-09 ENCOUNTER — Other Ambulatory Visit: Payer: Self-pay | Admitting: Family Medicine

## 2022-12-09 DIAGNOSIS — Z1231 Encounter for screening mammogram for malignant neoplasm of breast: Secondary | ICD-10-CM

## 2022-12-10 ENCOUNTER — Other Ambulatory Visit: Payer: Self-pay

## 2022-12-10 ENCOUNTER — Telehealth: Payer: Self-pay | Admitting: Neurology

## 2022-12-10 NOTE — Telephone Encounter (Signed)
Pt called stating that the gabapentin (NEURONTIN) 300 MG capsule five times a day is to strong for her and and she would like to know if they come in 150mg  that she can take 3x a day or if they come in tablet form so that she is able to cut them in half. Please advise.

## 2022-12-11 MED ORDER — GABAPENTIN 100 MG PO CAPS: 100.0000 mg | ORAL_CAPSULE | Freq: Three times a day (TID) | ORAL | 1 refills | Status: DC

## 2022-12-11 NOTE — Telephone Encounter (Signed)
I called pt.  She said that she cannot take gabapentin 300mg  5 times daily, makes her too drunk feeling.  She started taking only 300mg  at at bedtime.  (Does not control the restless leg, feels like soemthing weighing down her legs, not sleeping well). She has MS.  She asked about other mg caps or tablets.  I told her that only 100mg  caps available in lower dose.  She thought that would be better.  She has 2 bottles of 300mg  caps.  [facility] is her pharmacy.  Ok to give her 100mg  tid caps?

## 2022-12-24 MED ORDER — GABAPENTIN 100 MG PO CAPS: ORAL_CAPSULE | ORAL | 1 refills | Status: DC

## 2022-12-24 NOTE — Telephone Encounter (Signed)
Pt reports that her legs feel like muscle spasms, very painful, she would like a call to discuss if there is anything else she can for the discomfort.

## 2022-12-24 NOTE — Telephone Encounter (Signed)
Called pt back. Last seen 04/03/22 and next f/u 01/08/23. Sx intermittent. Depends on activity level. Confirmed she is taking the following:  gabapentin 100mg  TID, requip 0.5mg  (1 q am, 1 q evening, 2 po at bedtime-- she takes 1 tablet TID), baclofen 10mg  (1 tablet po TID)   Aware I will speak w/ MD and call back to provide recommendation from MD. She verbalized understanding.

## 2022-12-24 NOTE — Addendum Note (Signed)
Addended by: Marcelina Morel L on: 12/24/2022 11:30 AM   Modules accepted: Orders

## 2022-12-24 NOTE — Telephone Encounter (Addendum)
Spoke w/ Dr. Epimenio Foot. He recommends having her increase gabapentin 100mg  to take 1 in the morning, 2 in the afternoon and 2 in the evening.  LVM for pt relaying recommendation and that we called in updated prescription to her pharmacy. Asked her to call back if she has any further questions.

## 2022-12-24 NOTE — Telephone Encounter (Signed)
Pt called and told her instructions per Dr. Epimenio Foot "Dr. Epimenio Foot. He recommends having her increase Gabapentin 100mg  to take 1 in the Morning. 2 (200mg )in the Afternoon, and 2 (200mg )in the Evening.  Pt then states that 500mg  are going to be too much for her.    Told pt since that was too much for she can take 1 in the Morning, 1 in the Afternoon, and 2 in the Evening. Pt states that was better and she could do that.

## 2022-12-25 ENCOUNTER — Other Ambulatory Visit: Payer: Self-pay | Admitting: Family Medicine

## 2022-12-25 DIAGNOSIS — E2839 Other primary ovarian failure: Secondary | ICD-10-CM

## 2022-12-26 ENCOUNTER — Emergency Department (HOSPITAL_COMMUNITY): Payer: 59

## 2022-12-26 ENCOUNTER — Encounter (HOSPITAL_COMMUNITY): Payer: Self-pay | Admitting: Emergency Medicine

## 2022-12-26 ENCOUNTER — Emergency Department (HOSPITAL_COMMUNITY)
Admission: EM | Admit: 2022-12-26 | Discharge: 2022-12-30 | Disposition: A | Discharge status: Skilled Nursing Facility | Payer: 59 | Attending: Emergency Medicine | Admitting: Emergency Medicine

## 2022-12-26 ENCOUNTER — Other Ambulatory Visit: Payer: Self-pay

## 2022-12-26 DIAGNOSIS — R2681 Unsteadiness on feet: Secondary | ICD-10-CM | POA: Insufficient documentation

## 2022-12-26 DIAGNOSIS — Z9181 History of falling: Secondary | ICD-10-CM | POA: Diagnosis not present

## 2022-12-26 DIAGNOSIS — R6889 Other general symptoms and signs: Secondary | ICD-10-CM | POA: Diagnosis not present

## 2022-12-26 DIAGNOSIS — I499 Cardiac arrhythmia, unspecified: Secondary | ICD-10-CM | POA: Diagnosis not present

## 2022-12-26 DIAGNOSIS — M6281 Muscle weakness (generalized): Secondary | ICD-10-CM | POA: Diagnosis not present

## 2022-12-26 DIAGNOSIS — M25552 Pain in left hip: Secondary | ICD-10-CM | POA: Diagnosis not present

## 2022-12-26 DIAGNOSIS — J323 Chronic sphenoidal sinusitis: Secondary | ICD-10-CM | POA: Diagnosis not present

## 2022-12-26 DIAGNOSIS — R9089 Other abnormal findings on diagnostic imaging of central nervous system: Secondary | ICD-10-CM | POA: Diagnosis not present

## 2022-12-26 DIAGNOSIS — I6523 Occlusion and stenosis of bilateral carotid arteries: Secondary | ICD-10-CM | POA: Diagnosis not present

## 2022-12-26 DIAGNOSIS — Z79899 Other long term (current) drug therapy: Secondary | ICD-10-CM | POA: Diagnosis not present

## 2022-12-26 DIAGNOSIS — G319 Degenerative disease of nervous system, unspecified: Secondary | ICD-10-CM | POA: Diagnosis not present

## 2022-12-26 DIAGNOSIS — R4701 Aphasia: Secondary | ICD-10-CM | POA: Diagnosis not present

## 2022-12-26 DIAGNOSIS — I6782 Cerebral ischemia: Secondary | ICD-10-CM | POA: Diagnosis not present

## 2022-12-26 DIAGNOSIS — E876 Hypokalemia: Secondary | ICD-10-CM | POA: Diagnosis not present

## 2022-12-26 DIAGNOSIS — R262 Difficulty in walking, not elsewhere classified: Secondary | ICD-10-CM | POA: Diagnosis not present

## 2022-12-26 DIAGNOSIS — I10 Essential (primary) hypertension: Secondary | ICD-10-CM | POA: Diagnosis not present

## 2022-12-26 DIAGNOSIS — G35 Multiple sclerosis: Secondary | ICD-10-CM | POA: Diagnosis not present

## 2022-12-26 DIAGNOSIS — R4182 Altered mental status, unspecified: Secondary | ICD-10-CM | POA: Diagnosis present

## 2022-12-26 DIAGNOSIS — R410 Disorientation, unspecified: Secondary | ICD-10-CM | POA: Diagnosis not present

## 2022-12-26 DIAGNOSIS — Z743 Need for continuous supervision: Secondary | ICD-10-CM | POA: Diagnosis not present

## 2022-12-26 DIAGNOSIS — I700 Atherosclerosis of aorta: Secondary | ICD-10-CM | POA: Diagnosis not present

## 2022-12-26 LAB — CBC
HCT: 42.1 % (ref 36.0–46.0)
Hemoglobin: 14.1 g/dL (ref 12.0–15.0)
MCH: 29.6 pg (ref 26.0–34.0)
MCHC: 33.5 g/dL (ref 30.0–36.0)
MCV: 88.4 fL (ref 80.0–100.0)
Platelets: 274 10*3/uL (ref 150–400)
RBC: 4.76 MIL/uL (ref 3.87–5.11)
RDW: 13.6 % (ref 11.5–15.5)
WBC: 5.1 10*3/uL (ref 4.0–10.5)
nRBC: 0 % (ref 0.0–0.2)

## 2022-12-26 LAB — COMPREHENSIVE METABOLIC PANEL
ALT: 16 U/L (ref 0–44)
AST: 17 U/L (ref 15–41)
Albumin: 4.1 g/dL (ref 3.5–5.0)
Alkaline Phosphatase: 68 U/L (ref 38–126)
Anion gap: 13 (ref 5–15)
BUN: 10 mg/dL (ref 8–23)
CO2: 23 mmol/L (ref 22–32)
Calcium: 9.4 mg/dL (ref 8.9–10.3)
Chloride: 103 mmol/L (ref 98–111)
Creatinine, Ser: 0.71 mg/dL (ref 0.44–1.00)
GFR, Estimated: >60 mL/min (ref >60)
Glucose, Bld: 105 mg/dL — ABNORMAL HIGH (ref 70–99)
Potassium: 3.2 mmol/L — ABNORMAL LOW (ref 3.5–5.1)
Sodium: 139 mmol/L (ref 135–145)
Total Bilirubin: 1.1 mg/dL (ref 0.3–1.2)
Total Protein: 6.9 g/dL (ref 6.5–8.1)

## 2022-12-26 LAB — URINALYSIS, ROUTINE W REFLEX MICROSCOPIC
Bilirubin Urine: NEGATIVE
Glucose, UA: NEGATIVE mg/dL
Hgb urine dipstick: NEGATIVE
Ketones, ur: 20 mg/dL — AB
Leukocytes,Ua: NEGATIVE
Nitrite: NEGATIVE
Protein, ur: NEGATIVE mg/dL
Specific Gravity, Urine: 1.012 (ref 1.005–1.030)
pH: 7 (ref 5.0–8.0)

## 2022-12-26 LAB — DIFFERENTIAL
Abs Immature Granulocytes: 0.01 10*3/uL (ref 0.00–0.07)
Basophils Absolute: 0.1 10*3/uL (ref 0.0–0.1)
Basophils Relative: 1 %
Eosinophils Absolute: 0 10*3/uL (ref 0.0–0.5)
Eosinophils Relative: 0 %
Immature Granulocytes: 0 %
Lymphocytes Relative: 16 %
Lymphs Abs: 0.8 10*3/uL (ref 0.7–4.0)
Monocytes Absolute: 0.4 10*3/uL (ref 0.1–1.0)
Monocytes Relative: 7 %
Neutro Abs: 3.8 10*3/uL (ref 1.7–7.7)
Neutrophils Relative %: 76 %

## 2022-12-26 LAB — RAPID URINE DRUG SCREEN, HOSP PERFORMED
Amphetamines: NOT DETECTED
Barbiturates: NOT DETECTED
Benzodiazepines: NOT DETECTED
Cocaine: NOT DETECTED
Opiates: NOT DETECTED
Tetrahydrocannabinol: NOT DETECTED

## 2022-12-26 LAB — PROTIME-INR
INR: 1 (ref 0.8–1.2)
Prothrombin Time: 13.2 s (ref 11.4–15.2)

## 2022-12-26 LAB — APTT: aPTT: 21 s — ABNORMAL LOW (ref 24–36)

## 2022-12-26 LAB — CBG MONITORING, ED: Glucose-Capillary: 102 mg/dL — ABNORMAL HIGH (ref 70–99)

## 2022-12-26 LAB — ETHANOL: Alcohol, Ethyl (B): <10 mg/dL (ref <10)

## 2022-12-26 MED ORDER — TRAMADOL HCL 50 MG PO TABS
50.0000 mg | ORAL_TABLET | Freq: Once | ORAL | Status: AC
  Administered 2022-12-26: 50 mg via ORAL
  Filled 2022-12-26: qty 1

## 2022-12-26 MED ORDER — POTASSIUM CHLORIDE CRYS ER 20 MEQ PO TBCR
40.0000 meq | EXTENDED_RELEASE_TABLET | Freq: Once | ORAL | Status: AC
  Administered 2022-12-27: 40 meq via ORAL
  Filled 2022-12-26: qty 2

## 2022-12-26 MED ORDER — LORAZEPAM 2 MG/ML IJ SOLN
1.0000 mg | Freq: Once | INTRAMUSCULAR | Status: AC
  Administered 2022-12-26: 1 mg via INTRAVENOUS
  Filled 2022-12-26: qty 1

## 2022-12-26 MED ORDER — ACETAMINOPHEN 325 MG PO TABS
650.0000 mg | ORAL_TABLET | Freq: Once | ORAL | Status: AC
  Administered 2022-12-26: 650 mg via ORAL
  Filled 2022-12-26: qty 2

## 2022-12-26 NOTE — ED Provider Notes (Signed)
  Physical Exam  BP (!) 161/99 (BP Location: Right Arm)   Pulse 85   Resp 18   SpO2 97%   Physical Exam  Procedures  Procedures  ED Course / MDM    Medical Decision Making Amount and/or Complexity of Data Reviewed Labs: ordered. Radiology: ordered.  Risk OTC drugs. Prescription drug management.   Patient care assumed at shift handoff from [facility], PA-C.  In short, 67 year old female patient with history of MS, migraines, hypertension presented to the emergency department complaint of 24 hours of expressive aphasia and worsening mental status.  The patient has some confusion as to why she is at the hospital.  History was mostly obtained by patient's sister who is a medical power of attorney.  Reportedly at 4:45 PM on Thursday the patient texted the sister and the sister stated that the text and speech seemed different than usual.  Patient sister has some concerns about polypharmacy as well.  The patient lives at home and the patient's sister does not believe she is able to take care of herself anymore.  At the time of my assumption of care lab work was grossly unremarkable.  Head CT was grossly unremarkable.  Dr.Arora was consulted and recommended MRI brain.  If MRI brain is negative for stroke patient can likely be a boarder overnight with plans for [facility] consult for assistance with SNF placement in the morning.  Plan to reconsult neurology if MRI is positive for acute infarct or other concerning finding.  MR brain resulted.  1. Severely motion degraded examination.  2. No acute intracranial abnormality.  3. Generalized volume loss and advanced chronic ischemic white  matter changes.   At this time I plan to place the patient in boarder status.  TOC consult placed for SNF placement.  Patient's home meds and diet ordered.  Possible worsening of patient's known MS.        Darrick Grinder, PA-C 12/27/22 0132    Sabas Sous, MD 12/27/22 [phone]

## 2022-12-26 NOTE — ED Notes (Signed)
Pt reporting ongoing discomfort/pain; states she takes medications for same at home but has not had these today due to being in hospital.  Pt reports minimal relief with Tylenol given prior.  Message sent to [facility] regarding same.

## 2022-12-26 NOTE — ED Notes (Signed)
Per MRI staff, pt wanting to quit MRI early.  Staff to send imaging obtained but stated pt refused to continue with MRI.

## 2022-12-26 NOTE — ED Notes (Signed)
Pt assisted back into bed at this time and updated on plan for upcoming MRI.  Pt told this RN to "stop lying" to her about the scans.  This RN explained that he was not lying to her about going to MRI.  As this RN stepped out, pt told friend at bedside, "See they do not know what they are doing."

## 2022-12-26 NOTE — ED Triage Notes (Signed)
Pt from home via [facility] with reports of expressive aphasia, unsteady gate, and dizziness. Pts sister reports pt is having trouble putting sentences together. Pt denies headache and denies dizziness at this time.

## 2022-12-26 NOTE — ED Provider Notes (Cosign Needed Addendum)
[place] EMERGENCY DEPARTMENT AT [facility] Provider Note   CSN: [id number] Arrival date & time: 12/26/22  1642     History  Chief Complaint  Patient presents with   Aphasia    [name] is a 67 y.o. female history of MS, migraines, hypertension presented with 24 hours of expressive aphasia and altered mental status.  Patient is unsure as to why she is here and so history was mostly obtained by sister who is the medical power of attorney.  Sister states that at 4:45 PM yesterday she received a text from the patient and the need to speak when she called patient that that she did not want to speak anymore.  Patient does have new people coming over and helping out with her medications is a recent change and so sister is concern for possible polypharmacy.  Patient's sister states that patient is acting different from baseline in terms of forgetting things, having problems expressing her thoughts, forgetting how to get into her phone.  Patient's history of does state that patient does have chronic falls with the most recent being this morning however it was unwitnessed.  Patient denies any head pain, neck pain, new onset weakness, fevers, vision changes.  Home Medications Prior to Admission medications   Medication Sig Start Date End Date Taking? Authorizing Provider  acetaminophen (TYLENOL) 500 MG tablet Take 500 mg by mouth every 6 (six) hours as needed.    [provider]  amLODipine (NORVASC) 10 MG tablet Take 10 mg by mouth daily.    [provider]  atorvastatin (LIPITOR) 20 MG tablet Take 20 mg by mouth daily.    [provider]  baclofen (LIORESAL) 10 MG tablet TAKE ONE TABLET BY MOUTH EVERY MORNING and TAKE ONE TABLET BY MOUTH AT NOON and TAKE ONE TABLET BY MOUTH EVERYDAY AT BEDTIME 12/04/22   Sater, Pearletha Furl, MD  benzonatate (TESSALON) 200 MG capsule Take 200 mg by mouth 3 (three) times daily as needed. Patient not taking: Reported on 09/16/2022  04/10/20   [provider]  busPIRone (BUSPAR) 15 MG tablet TAKE ONE TABLET BY MOUTH EVERY MORNING and TAKE ONE TABLET BY MOUTH EVERYDAY AT BEDTIME Patient taking differently: Take 15 mg by mouth 2 (two) times daily. TAKE ONE TABLET BY MOUTH EVERY MORNING and TAKE ONE TABLET BY MOUTH EVERYDAY AT BEDTIME 07/03/22   Sater, Pearletha Furl, MD  dalfampridine 10 MG TB12 TAKE ONE TABLET BY MOUTH EVERY MORNING and TAKE ONE TABLET BY MOUTH EVERYDAY AT BEDTIME 03/27/22   Sater, Pearletha Furl, MD  esomeprazole (NEXIUM) 40 MG capsule Take 40 mg by mouth daily.    [provider]  estradiol (ESTRACE) 0.1 MG/GM vaginal cream Place vaginally every evening. 01/02/21   [provider]  famotidine (PEPCID) 20 MG tablet Take 20 mg by mouth at bedtime.    [provider]  fluticasone (FLONASE) 50 MCG/ACT nasal spray Place 2 sprays into the nose as needed. For stuffy nose.    [provider]  gabapentin (NEURONTIN) 100 MG capsule Take 1 capsule (100 mg total) by mouth every morning AND 2 capsules (200 mg total) daily in the afternoon AND 2 capsules (200 mg total) every evening. 12/24/22   Sater, Pearletha Furl, MD  GEMTESA 75 MG TABS TAKE ONE TABLET BY MOUTH EVERYDAY AT BEDTIME FOR urgency of URINATION 07/31/22   Sater, Pearletha Furl, MD  ibuprofen (ADVIL,MOTRIN) 600 MG tablet Take 1 tablet by mouth every 8 (eight) hours as  needed for mild pain or moderate pain.  06/23/13   [provider]  Lidocaine, Anorectal, 5 % CREA Apply topically 3 (three) times daily as needed.    [provider]  Loperamide HCl (ANTI-DIARRHEAL PO) Take 2 mg by mouth as needed.    [provider]  losartan-hydrochlorothiazide (HYZAAR) 100-25 MG tablet Take 1 tablet by mouth daily.    [provider]  meclizine (ANTIVERT) 25 MG tablet Take 25 mg by mouth as needed for dizziness.    [provider]  montelukast (SINGULAIR) 10 MG tablet Take 10 mg by mouth at bedtime.    [provider]  mupirocin ointment (BACTROBAN) 2 % mupirocin 2 % topical ointment  APPLY A SMALL AMOUNT TO THE AFFECTED AREA BY TOPICAL ROUTE 3 TIMES PER DAY    [provider]  nystatin (MYCOSTATIN/NYSTOP) powder Apply topically 4 (four) times daily.    [provider]  nystatin cream (MYCOSTATIN) Apply 1 application topically 2 (two) times daily. 02/09/20   [provider]  ondansetron (ZOFRAN) 4 MG tablet Take 4-8 mg by mouth every 6 (six) hours as needed for nausea or vomiting.    [provider]  potassium chloride (K-DUR) 10 MEQ tablet Take 10 mEq by mouth daily.    [provider]  rOPINIRole (REQUIP) 0.5 MG tablet TAKE ONE TABLET BY MOUTH every morning, ONE TABLET every evening, & TAKE TWO TABLETS everyday AT bedtime 05/07/22   Sater, Pearletha Furl, MD  sertraline (ZOLOFT) 100 MG tablet TAKE 1 AND 1/2 TABLETS BY MOUTH ONCE DAILY FOR mood 07/31/22   Sater, Pearletha Furl, MD  solifenacin (VESICARE) 10 MG tablet TAKE ONE TABLET BY MOUTH ONCE DAILY FOR overactive FOR BLADDER 07/03/22   Sater, Pearletha Furl, MD  SUDOGEST 30 MG tablet Take 30 mg by mouth as needed. 04/13/20   [provider]  traMADol (ULTRAM) 50 MG tablet Take 1 tablet (50 mg total) by mouth daily. Patient taking differently: Take 50 mg by mouth as needed. 01/11/20   Sater, Pearletha Furl, MD  traZODone (DESYREL) 100 MG tablet Take 50 mg by mouth at bedtime.    [provider]      Allergies    Other and Myrbetriq [mirabegron]    Review of Systems   Review of Systems  Physical Exam Updated Vital Signs BP (!) 188/72   Pulse 80   Resp 20   SpO2 98%  Physical Exam Vitals reviewed.  Constitutional:      General: She is not in acute distress. HENT:     Head: Normocephalic and atraumatic.  Eyes:     Extraocular Movements: Extraocular movements intact.     Conjunctiva/sclera: Conjunctivae normal.     Pupils: Pupils are equal, round, and reactive to light.  Cardiovascular:      Rate and Rhythm: Normal rate and regular rhythm.     Pulses: Normal pulses.     Heart sounds: Normal heart sounds.     Comments: 2+ bilateral radial/dorsalis pedis pulses with regular rate Pulmonary:     Effort: Pulmonary effort is normal. No respiratory distress.     Breath sounds: Normal breath sounds.  Abdominal:     Palpations: Abdomen is soft.     Tenderness: There is no abdominal tenderness. There is no guarding or rebound.  Musculoskeletal:        General: Normal range of motion.     Cervical back: Normal range of motion and neck supple.     Comments:  5 out of 5 bilateral grip/leg extension strength  Skin:    General: Skin is warm and dry.     Capillary Refill: Capillary refill takes less than 2 seconds.  Neurological:     Mental Status: She is alert and oriented to person, place, and time.     Sensory: Sensation is intact.     Motor: Motor function is intact.     Comments: Sensation intact in all 4 limbs Vision grossly intact Expressive aphasia noted Dysmetria with finger to nose on right side  Psychiatric:        Mood and Affect: Mood normal.     ED Results / Procedures / Treatments   Labs (all labs ordered are listed, but only abnormal results are displayed) Labs Reviewed  CBG MONITORING, ED - Abnormal; Notable for the following components:      Result Value   Glucose-Capillary 102 (*)    All other components within normal limits  CBC  COMPREHENSIVE METABOLIC PANEL  ETHANOL  PROTIME-INR  APTT  RAPID URINE DRUG SCREEN, HOSP PERFORMED  URINALYSIS, ROUTINE W REFLEX MICROSCOPIC  DIFFERENTIAL    EKG EKG Interpretation Date/Time:  Friday December 26 2022 17:03:03 EDT Ventricular Rate:  77 PR Interval:  142 QRS Duration:  99 QT Interval:  392 QTC Calculation: 444 R Axis:   5  Text Interpretation: Sinus rhythm Paired ventricular premature complexes Minimal ST depression, lateral leads Confirmed by Pricilla Loveless [phone]) on 12/26/2022 5:10:45  PM  Radiology No results found.  Procedures .Critical Care  Performed by: Netta Corrigan, PA-C Authorized by: Netta Corrigan, PA-C   Critical care provider statement:    Critical care time (minutes):  40   Critical care time was exclusive of:  Separately billable procedures and treating other patients   Critical care was necessary to treat or prevent imminent or life-threatening deterioration of the following conditions: tPA consideration.   Critical care was time spent personally by me on the following activities:  Blood draw for specimens, development of treatment plan with patient or surrogate, discussions with consultants, evaluation of patient's response to treatment, examination of patient, obtaining history from patient or surrogate, review of old charts, re-evaluation of patient's condition, pulse oximetry, ordering and review of radiographic studies, ordering and review of laboratory studies and ordering and performing treatments and interventions   I assumed direction of critical care for this patient from another provider in my specialty: no     Care discussed with comment:  Neurology on call     Medications Ordered in ED Medications - No data to display  ED Course/ Medical Decision Making/ A&P                                 Medical Decision Making Amount and/or Complexity of Data Reviewed Labs: ordered. Radiology: ordered.  Risk OTC drugs. Prescription drug management.   Sharol Harness 67 y.o. presented today for AMS. Working DDx that I considered at this time includes, but not limited to, CVA, ICH, intracranial mass, critical dehydration, heptatic dysfunction, uremia, hypercarbia/hypoxia/COPD exacerbation, intoxication, endocrine abnormality, toxidrome, adrenal insufficiency, hypoglycemia/hyperglycemia, paraneoplastic process.  R/o DDx: Pending  Review of prior external notes: 10/15/2022 office visit  Unique Tests and My Interpretation:  CT Head wo  Contrast: No acute findings CBC: Unremarkable CMP: Mild hypokalemia 3.2 UA: Unremarkable CXR: Unremarkable CBG: 102 UDS: Unremarkable PT/INR: Unremarkable Ethanol: Unremarkable EKG: Rate, rhythm, axis, intervals all examined  and without medically relevant abnormality. ST segments without concerns for elevations MR: Pending  Discussion with Independent Historian:  Sister  Discussion of Management of Tests:  Wilford Corner, MD neurology  Risk: Pending  Risk Stratification Score: none  Staffed with Criss Alvine, MD  Plan: On exam patient was in no acute distress but was hypertensive 180/72.  On exam patient did have mild dysmetria while doing finger-to-nose on the right side and some mild expressive aphasia but otherwise had reassuring physical exam. Considered tPA but outside window. Code stroke note called due to symptoms being present for 24 hours. Will obtain broad workup including CT head and neck due to falls and altered mental status.  Patient given Tylenol for her chronic left hip pain.  Patient's labs came back largely unremarkable including imaging.  Will replenish patient's potassium.  Patient does continue to have expressive aphasia and so we will consult neurology to see if they want MRI with and without contrast to rule out stroke and to assess patient's MS as this could possibly be progression of her MS.   Neurology recommends MRI with and without contrast to evaluate patient's progression of MS and rule out stroke.  Patient did go to the MRI scanner however was unable to tolerate the abdomen or eye and had to cease the imaging early and thus imaging was limited and we were not able to get MR brain with contrast.  Patient signed out to Barrie Dunker, PA-C.  Please review their note for the continuation of patient's care.  The plan at this point is follow-up on MR to rule out stroke.  If negative TOC consult in the morning.  If positive contact neurology and admit.   This chart was  dictated using voice recognition software.  Despite best efforts to proofread,  errors can occur which can change the documentation meaning.         Final Clinical Impression(s) / ED Diagnoses Final diagnoses:  None    Rx / DC Orders ED Discharge Orders     None         Remi Deter 12/26/22 2353    Netta Corrigan, PA-C 12/27/22 1610    Pricilla Loveless, MD 12/28/22 2352

## 2022-12-27 MED ORDER — SERTRALINE HCL 100 MG PO TABS
100.0000 mg | ORAL_TABLET | Freq: Every day | ORAL | Status: DC
  Administered 2022-12-27 – 2022-12-30 (×4): 100 mg via ORAL
  Filled 2022-12-27 (×4): qty 1

## 2022-12-27 MED ORDER — TRAMADOL HCL 50 MG PO TABS
50.0000 mg | ORAL_TABLET | Freq: Four times a day (QID) | ORAL | Status: DC | PRN
  Administered 2022-12-27: 50 mg via ORAL
  Filled 2022-12-27: qty 1

## 2022-12-27 MED ORDER — HYDROCHLOROTHIAZIDE 25 MG PO TABS
25.0000 mg | ORAL_TABLET | Freq: Every day | ORAL | Status: DC
  Administered 2022-12-27 – 2022-12-30 (×4): 25 mg via ORAL
  Filled 2022-12-27 (×4): qty 1

## 2022-12-27 MED ORDER — GABAPENTIN 100 MG PO CAPS
200.0000 mg | ORAL_CAPSULE | Freq: Every evening | ORAL | Status: DC
  Administered 2022-12-27 – 2022-12-29 (×3): 200 mg via ORAL
  Filled 2022-12-27 (×3): qty 2

## 2022-12-27 MED ORDER — ROPINIROLE HCL 0.5 MG PO TABS
0.5000 mg | ORAL_TABLET | Freq: Three times a day (TID) | ORAL | Status: DC
  Administered 2022-12-27 – 2022-12-30 (×10): 0.5 mg via ORAL
  Filled 2022-12-27 (×12): qty 1

## 2022-12-27 MED ORDER — PANTOPRAZOLE SODIUM 40 MG PO TBEC
80.0000 mg | DELAYED_RELEASE_TABLET | Freq: Every day | ORAL | Status: DC
  Administered 2022-12-27 – 2022-12-30 (×4): 80 mg via ORAL
  Filled 2022-12-27 (×4): qty 2

## 2022-12-27 MED ORDER — LORAZEPAM 1 MG PO TABS
1.0000 mg | ORAL_TABLET | Freq: Once | ORAL | Status: AC
  Administered 2022-12-27: 1 mg via ORAL
  Filled 2022-12-27: qty 1

## 2022-12-27 MED ORDER — BUSPIRONE HCL 10 MG PO TABS
15.0000 mg | ORAL_TABLET | Freq: Two times a day (BID) | ORAL | Status: DC
  Administered 2022-12-27 – 2022-12-30 (×8): 15 mg via ORAL
  Filled 2022-12-27 (×8): qty 2

## 2022-12-27 MED ORDER — AMLODIPINE BESYLATE 5 MG PO TABS
10.0000 mg | ORAL_TABLET | Freq: Every day | ORAL | Status: DC
  Administered 2022-12-27 – 2022-12-30 (×4): 10 mg via ORAL
  Filled 2022-12-27 (×4): qty 2

## 2022-12-27 MED ORDER — TRAZODONE HCL 50 MG PO TABS
50.0000 mg | ORAL_TABLET | Freq: Every day | ORAL | Status: DC
  Administered 2022-12-27 – 2022-12-29 (×4): 50 mg via ORAL
  Filled 2022-12-27 (×4): qty 1

## 2022-12-27 MED ORDER — MECLIZINE HCL 25 MG PO TABS: 25.0000 mg | ORAL_TABLET | ORAL | Status: DC | PRN

## 2022-12-27 MED ORDER — GABAPENTIN 100 MG PO CAPS
200.0000 mg | ORAL_CAPSULE | Freq: Every day | ORAL | Status: DC
  Administered 2022-12-27 – 2022-12-29 (×4): 200 mg via ORAL
  Filled 2022-12-27 (×4): qty 2

## 2022-12-27 MED ORDER — IBUPROFEN 400 MG PO TABS
600.0000 mg | ORAL_TABLET | Freq: Three times a day (TID) | ORAL | Status: DC | PRN
  Administered 2022-12-27: 600 mg via ORAL
  Filled 2022-12-27: qty 1

## 2022-12-27 MED ORDER — DALFAMPRIDINE ER 10 MG PO TB12: 10.0000 mg | ORAL_TABLET | Freq: Two times a day (BID) | ORAL | Status: DC

## 2022-12-27 MED ORDER — GABAPENTIN 100 MG PO CAPS
100.0000 mg | ORAL_CAPSULE | ORAL | Status: DC
  Administered 2022-12-27 – 2022-12-30 (×4): 100 mg via ORAL
  Filled 2022-12-27 (×4): qty 1

## 2022-12-27 MED ORDER — LOSARTAN POTASSIUM 50 MG PO TABS
100.0000 mg | ORAL_TABLET | Freq: Every day | ORAL | Status: DC
  Administered 2022-12-27 – 2022-12-30 (×4): 100 mg via ORAL
  Filled 2022-12-27 (×4): qty 2

## 2022-12-27 MED ORDER — ONDANSETRON HCL 4 MG PO TABS: 4.0000 mg | ORAL_TABLET | Freq: Four times a day (QID) | ORAL | Status: DC | PRN

## 2022-12-27 MED ORDER — FAMOTIDINE 20 MG PO TABS
20.0000 mg | ORAL_TABLET | Freq: Every day | ORAL | Status: DC
  Administered 2022-12-27 – 2022-12-29 (×4): 20 mg via ORAL
  Filled 2022-12-27 (×4): qty 1

## 2022-12-27 MED ORDER — LOSARTAN POTASSIUM-HCTZ 100-25 MG PO TABS: 1.0000 | ORAL_TABLET | Freq: Every day | ORAL | Status: DC

## 2022-12-27 MED ORDER — ATORVASTATIN CALCIUM 10 MG PO TABS
20.0000 mg | ORAL_TABLET | Freq: Every day | ORAL | Status: DC
  Administered 2022-12-27 – 2022-12-30 (×4): 20 mg via ORAL
  Filled 2022-12-27 (×4): qty 2

## 2022-12-27 MED ORDER — POTASSIUM CHLORIDE CRYS ER 10 MEQ PO TBCR
10.0000 meq | EXTENDED_RELEASE_TABLET | Freq: Every day | ORAL | Status: DC
  Administered 2022-12-27 – 2022-12-30 (×4): 10 meq via ORAL
  Filled 2022-12-27 (×5): qty 1

## 2022-12-27 MED ORDER — ACETAMINOPHEN 500 MG PO TABS
500.0000 mg | ORAL_TABLET | Freq: Four times a day (QID) | ORAL | Status: DC | PRN
  Administered 2022-12-27 – 2022-12-29 (×3): 500 mg via ORAL
  Filled 2022-12-27 (×3): qty 1

## 2022-12-27 MED ORDER — MONTELUKAST SODIUM 10 MG PO TABS
10.0000 mg | ORAL_TABLET | Freq: Every day | ORAL | Status: DC
  Administered 2022-12-27 – 2022-12-29 (×4): 10 mg via ORAL
  Filled 2022-12-27 (×4): qty 1

## 2022-12-27 NOTE — ED Notes (Signed)
Pt is refusing to stay in bed. Pt is not redirectable at this time.

## 2022-12-27 NOTE — ED Notes (Addendum)
Patient stating "I want to go to my room".   Nurse explained that patient was in her room for the time being as we figure out the next step.   Per report given patient awaiting [facility] consult for possible SNF placement.   Patient complaining about being in bed for 2 days, and stating "my medications are way overdue".   Patient argumentative with staff, nurse explained that we are trying to keep patient safe and that morning medications are due at 1000.

## 2022-12-27 NOTE — ED Notes (Signed)
Pt stating she wants to go to "another room" and also that she "wants to leave."  Pt reminded that she is staying in the hospital at current time.  Pt continues to be restless in room and difficult to redirect.  Pt offered various option to improve her comfort.  Pt finally agreeable that her home medications would likely help her be more comfortable.  McCauley PA notified of same.

## 2022-12-27 NOTE — ED Notes (Signed)
Pt sitting at the end of her bed refusing to move back

## 2022-12-27 NOTE — NC FL2 (Signed)
[place] MEDICAID FL2 LEVEL OF CARE FORM     IDENTIFICATION  Patient Name: [name] Birthdate: [birth year] Sex: female Admission Date (Current Location): 12/26/2022  [facility] and [place] Number:  Producer, television/film/video and Address:  [facility], [address] [place], [place] [address]      Provider Number: [id number]  Attending Physician Name and Address:  System, Provider Not In  Relative Name and Phone Number:  Eloy End [phone]    Current Level of Care: Hospital Recommended Level of Care: Skilled Nursing Facility Prior Approval Number:    Date Approved/Denied:   PASRR Number: [id number]  Discharge Plan: SNF    Current Diagnoses: Patient Active Problem List   Diagnosis Date Noted   Left hip pain 06/20/2020   Left sided sciatica 06/20/2020   Depression with anxiety 06/20/2019   Dry eyes 09/30/2017   Urinary urgency 09/30/2016   Neck pain 01/28/2016   Bilateral occipital neuralgia 01/28/2016   Gait disturbance 11/23/2015   Other fatigue 11/23/2015   Memory loss 11/23/2015   Snoring 11/23/2015   Excessive daytime sleepiness 11/23/2015   S/P lumbar spinal fusion 05/10/2015   S/P lumbar fusion 05/10/2015   Migraine without aura 07/07/2013   Multiple sclerosis (HCC) 12/30/2012    Orientation RESPIRATION BLADDER Height & Weight     Self, Time, Situation, Place  Normal Continent Weight:   Height:     BEHAVIORAL SYMPTOMS/MOOD NEUROLOGICAL BOWEL NUTRITION STATUS      Continent Diet (See discharge summary)  AMBULATORY STATUS COMMUNICATION OF NEEDS Skin   Extensive Assist Verbally Normal                       Personal Care Assistance Level of Assistance  Bathing, Feeding, Dressing Bathing Assistance: Maximum assistance Feeding assistance: Limited assistance Dressing Assistance: Maximum assistance     Functional Limitations Info             SPECIAL CARE FACTORS FREQUENCY  PT (By licensed PT), OT (By licensed  OT)     PT Frequency: 5 x a week OT Frequency: 5 x a week            Contractures Contractures Info: Not present    Additional Factors Info  Code Status Code Status Info: Full code             Current Medications (12/27/2022):  This is the current hospital active medication list Current Facility-Administered Medications  Medication Dose Route Frequency Provider Last Rate Last Admin   acetaminophen (TYLENOL) tablet 500 mg  500 mg Oral Q6H PRN Barrie Dunker B, PA-C   500 mg at 12/27/22 1406   amLODipine (NORVASC) tablet 10 mg  10 mg Oral Daily Barrie Dunker B, PA-C   10 mg at 12/27/22 0954   atorvastatin (LIPITOR) tablet 20 mg  20 mg Oral Daily Barrie Dunker B, PA-C   20 mg at 12/27/22 0953   busPIRone (BUSPAR) tablet 15 mg  15 mg Oral BID Darrick Grinder, PA-C   15 mg at 12/27/22 8469   dalfampridine TB12 10 mg  10 mg Oral Q12H Darrick Grinder, PA-C       famotidine (PEPCID) tablet 20 mg  20 mg Oral QHS Barrie Dunker B, PA-C   20 mg at 12/27/22 0150   gabapentin (NEURONTIN) capsule 100 mg  100 mg Oral BH-q7a McCauley, Larry B, PA-C   100 mg at 12/27/22 6295   And   gabapentin (NEURONTIN)  capsule 200 mg  200 mg Oral Q1500 Darrick Grinder, PA-C   200 mg at 12/27/22 1406   And   gabapentin (NEURONTIN) capsule 200 mg  200 mg Oral QPM McCauley, Larry B, PA-C       losartan (COZAAR) tablet 100 mg  100 mg Oral Daily Barrie Dunker B, PA-C   100 mg at 12/27/22 1610   And   hydrochlorothiazide (HYDRODIURIL) tablet 25 mg  25 mg Oral Daily Barrie Dunker B, PA-C   25 mg at 12/27/22 0955   ibuprofen (ADVIL) tablet 600 mg  600 mg Oral Q8H PRN Darrick Grinder, PA-C   600 mg at 12/27/22 9604   meclizine (ANTIVERT) tablet 25 mg  25 mg Oral PRN Barrie Dunker B, PA-C       montelukast (SINGULAIR) tablet 10 mg  10 mg Oral QHS Barrie Dunker B, PA-C   10 mg at 12/27/22 0151   ondansetron (ZOFRAN) tablet 4 mg  4 mg Oral Q6H PRN Barrie Dunker B, PA-C       pantoprazole  (PROTONIX) EC tablet 80 mg  80 mg Oral Q1200 Barrie Dunker B, PA-C   80 mg at 12/27/22 1406   potassium chloride (KLOR-CON M) CR tablet 10 mEq  10 mEq Oral Daily Barrie Dunker B, PA-C   10 mEq at 12/27/22 5409   rOPINIRole (REQUIP) tablet 0.5 mg  0.5 mg Oral TID Barrie Dunker B, PA-C   0.5 mg at 12/27/22 0955   sertraline (ZOLOFT) tablet 100 mg  100 mg Oral Daily Barrie Dunker B, PA-C   100 mg at 12/27/22 0954   traMADol (ULTRAM) tablet 50 mg  50 mg Oral Q6H PRN Darrick Grinder, PA-C   50 mg at 12/27/22 0150   traZODone (DESYREL) tablet 50 mg  50 mg Oral QHS Barrie Dunker B, PA-C   50 mg at 12/27/22 0151   Current Outpatient Medications  Medication Sig Dispense Refill   acetaminophen (TYLENOL) 500 MG tablet Take 500 mg by mouth every 6 (six) hours as needed.     amLODipine (NORVASC) 10 MG tablet Take 10 mg by mouth daily.     atorvastatin (LIPITOR) 20 MG tablet Take 20 mg by mouth daily.     baclofen (LIORESAL) 10 MG tablet TAKE ONE TABLET BY MOUTH EVERY MORNING and TAKE ONE TABLET BY MOUTH AT NOON and TAKE ONE TABLET BY MOUTH EVERYDAY AT BEDTIME 90 tablet 0   benzonatate (TESSALON) 200 MG capsule Take 200 mg by mouth 3 (three) times daily as needed. (Patient not taking: Reported on 09/16/2022)     busPIRone (BUSPAR) 15 MG tablet TAKE ONE TABLET BY MOUTH EVERY MORNING and TAKE ONE TABLET BY MOUTH EVERYDAY AT BEDTIME (Patient taking differently: Take 15 mg by mouth 2 (two) times daily. TAKE ONE TABLET BY MOUTH EVERY MORNING and TAKE ONE TABLET BY MOUTH EVERYDAY AT BEDTIME) 180 tablet 3   dalfampridine 10 MG TB12 TAKE ONE TABLET BY MOUTH EVERY MORNING and TAKE ONE TABLET BY MOUTH EVERYDAY AT BEDTIME 60 tablet 11   esomeprazole (NEXIUM) 40 MG capsule Take 40 mg by mouth daily.     estradiol (ESTRACE) 0.1 MG/GM vaginal cream Place vaginally every evening.     famotidine (PEPCID) 20 MG tablet Take 20 mg by mouth at bedtime.     fluticasone (FLONASE) 50 MCG/ACT nasal spray Place 2 sprays into  the nose as needed. For stuffy nose.     gabapentin (NEURONTIN) 100 MG capsule Take 1 capsule (100  mg total) by mouth every morning AND 2 capsules (200 mg total) daily in the afternoon AND 2 capsules (200 mg total) every evening. 150 capsule 1   GEMTESA 75 MG TABS TAKE ONE TABLET BY MOUTH EVERYDAY AT BEDTIME FOR urgency of URINATION 30 tablet 5   ibuprofen (ADVIL,MOTRIN) 600 MG tablet Take 1 tablet by mouth every 8 (eight) hours as needed for mild pain or moderate pain.      Lidocaine, Anorectal, 5 % CREA Apply topically 3 (three) times daily as needed.     Loperamide HCl (ANTI-DIARRHEAL PO) Take 2 mg by mouth as needed.     losartan-hydrochlorothiazide (HYZAAR) 100-25 MG tablet Take 1 tablet by mouth daily.     meclizine (ANTIVERT) 25 MG tablet Take 25 mg by mouth as needed for dizziness.     montelukast (SINGULAIR) 10 MG tablet Take 10 mg by mouth at bedtime.     mupirocin ointment (BACTROBAN) 2 % mupirocin 2 % topical ointment  APPLY A SMALL AMOUNT TO THE AFFECTED AREA BY TOPICAL ROUTE 3 TIMES PER DAY     nystatin (MYCOSTATIN/NYSTOP) powder Apply topically 4 (four) times daily.     nystatin cream (MYCOSTATIN) Apply 1 application topically 2 (two) times daily.     ondansetron (ZOFRAN) 4 MG tablet Take 4-8 mg by mouth every 6 (six) hours as needed for nausea or vomiting.     potassium chloride (K-DUR) 10 MEQ tablet Take 10 mEq by mouth daily.     rOPINIRole (REQUIP) 0.5 MG tablet TAKE ONE TABLET BY MOUTH every morning, ONE TABLET every evening, & TAKE TWO TABLETS everyday AT bedtime 120 tablet 11   sertraline (ZOLOFT) 100 MG tablet TAKE 1 AND 1/2 TABLETS BY MOUTH ONCE DAILY FOR mood 135 tablet 5   solifenacin (VESICARE) 10 MG tablet TAKE ONE TABLET BY MOUTH ONCE DAILY FOR overactive FOR BLADDER 90 tablet 1   SUDOGEST 30 MG tablet Take 30 mg by mouth as needed.     traMADol (ULTRAM) 50 MG tablet Take 1 tablet (50 mg total) by mouth daily. (Patient taking differently: Take 50 mg by mouth as  needed.) 30 tablet 0   traZODone (DESYREL) 100 MG tablet Take 50 mg by mouth at bedtime.       Discharge Medications: Please see discharge summary for a list of discharge medications.  Relevant Imaging Results:  Relevant Lab Results:   Additional Information SSN # [id number]  Lockie Pares, RN

## 2022-12-27 NOTE — ED Provider Notes (Signed)
Emergency Medicine Observation Re-evaluation Note  [name] is a 67 y.o. female, seen on rounds today.  Pt initially presented to the ED for complaints of Aphasia Currently, the patient is resting.  Physical Exam  BP (!) 168/89 (BP Location: Right Arm)   Pulse 87   Temp 98.4 F (36.9 C) (Oral)   Resp 16   SpO2 98%  Physical Exam General: NAD   ED Course / MDM  EKG:EKG Interpretation Date/Time:  Friday December 26 2022 17:03:03 EDT Ventricular Rate:  77 PR Interval:  142 QRS Duration:  99 QT Interval:  392 QTC Calculation: 444 R Axis:   5  Text Interpretation: Sinus rhythm Paired ventricular premature complexes Minimal ST depression, lateral leads Confirmed by Pricilla Loveless [phone]) on 12/26/2022 5:10:45 PM  I have reviewed the labs performed to date as well as medications administered while in observation.  Recent changes in the last 24 hours include pt seen yesterday evening, no indication for medical admission, plan for [facility] and SNF placement.  Plan  Current plan is for SW for SNF.    Ernie Avena, MD 12/27/22 1258

## 2022-12-27 NOTE — Care Management (Addendum)
Patient has been assessed by PT. She is refusing SNF and wants [facility]. Spoke to sister Ms Merlinda Frederick who was at bedside with patient.  She lives in a disabled apartment. She has a walker and all DME needed. She wears a life alert. She has cats, and this is why she will not go to rehab. Messaged team for [facility] service orders for PT OT Aide , RN for medication management, and CSW. She previously had [facility]. Messaged them for acceptance. She has a question regarding how her medications were packaged and shipped. She will make a appointment with Pcp and have this addressed.  1500 Patient seen by MD, he stated she is now decided on SNF placement.  Will fax out and obtain PASSR. 1600 Patient wants to know why she cannot go home and wait for rehab. Attempted to call patient to discuss. Phone went unanswered at this time.

## 2022-12-27 NOTE — ED Notes (Signed)
Pt reporting bugs in her drink at this time . No bugs noted in drink.

## 2022-12-27 NOTE — ED Notes (Signed)
Pt reports there are bugs in her sandwich. Rn noted no bugs on the sandwich. Rn assisted pt to throw away sandwich.

## 2022-12-27 NOTE — ED Notes (Signed)
Pt continues to be restless in bed; wanting to sit in chair at bedside.  Pt advised that since she is a falls risk, she needs to remain in stretcher.

## 2022-12-27 NOTE — Evaluation (Signed)
Physical Therapy Evaluation Patient Details Name: [name] MRN: [id number] DOB: 1955/11/12 Today's Date: 12/27/2022  History of Present Illness  Pt is a 67yo female who was brought to ED due to AMS, unsteady gait, and aphasia. PMH: MS, migraines, HTN  Clinical Impression  Pt admitted with above. Pt with noted word finding difficulties, confusion, restlessness, impulsivity, decreased insight to deficits and safety, and is at high falls risk. Pt unsafe to return home alone and would need 24/7 assist due to impaired cognition and function. Recommend an inpatient rehab program < 3hrs/day to address above deficits to work back towards independence. Acute PT to cont to follow.        If plan is discharge home, recommend the following: A little help with walking and/or transfers;A little help with bathing/dressing/bathroom;Supervision due to cognitive status;Assist for transportation   Can travel by private vehicle   Yes    Equipment Recommendations None recommended by PT  Recommendations for Other Services       Functional Status Assessment Patient has had a recent decline in their functional status and demonstrates the ability to make significant improvements in function in a reasonable and predictable amount of time.     Precautions / Restrictions Precautions Precautions: Fall Precaution Comments: confused, word finding difficulties Restrictions Weight Bearing Restrictions: No      Mobility  Bed Mobility Overal bed mobility: Needs Assistance Bed Mobility: Supine to Sit     Supine to sit: Min assist     General bed mobility comments: minA for trunk elevation with HOB flat, pt able to bring LEs off EOB    Transfers Overall transfer level: Needs assistance Equipment used: Rollator (4 wheels) Transfers: Sit to/from Stand Sit to Stand: Min assist           General transfer comment: verbal cues for safe hand placement, to lock the rollator, and sequencing reaching back  for chair when returning to sit. Pt with initial posterior bias    Ambulation/Gait Ambulation/Gait assistance: Min assist Gait Distance (Feet): 100 Feet Assistive device: Rollator (4 wheels) Gait Pattern/deviations: Step-through pattern, Decreased stride length, Staggering left, Staggering right, Narrow base of support, Trunk flexed Gait velocity: dec Gait velocity interpretation: <1.31 ft/sec, indicative of household ambulator   General Gait Details: pt unsteady with short step height and lenght, pt amb 32' with brakes of the rollator on before realizing it despite verbal cues from PT. Pt with improved step length when brakes were unlocked however noted increased staggering L/R due to hyper mobility of wheels.  Stairs            Wheelchair Mobility     Tilt Bed    Modified Rankin (Stroke Patients Only) Modified Rankin (Stroke Patients Only) Pre-Morbid Rankin Score: Moderate disability Modified Rankin: Moderate disability     Balance Overall balance assessment: Needs assistance Sitting-balance support: Feet supported, No upper extremity supported Sitting balance-Leahy Scale: Fair Sitting balance - Comments: posterior bias   Standing balance support: Bilateral upper extremity supported Standing balance-Leahy Scale: Poor Standing balance comment: reliant on RW                             Pertinent Vitals/Pain Pain Assessment Pain Assessment: No/denies pain    Home Living Family/patient expects to be discharged to:: Private residence Living Arrangements: Alone Available Help at Discharge: Family;Friend(s);Available PRN/intermittently Type of Home: Apartment Home Access: Level entry       Home Layout: One level  Home Equipment: Rollator (4 wheels);Shower seat;Hand held shower head;Grab bars - tub/shower      Prior Function Prior Level of Function : Needs assist (pt poor historian)             Mobility Comments: uses rollator ADLs Comments:  reports she does her dressing and bathing however states "I don't like getting into that shower."  Pt states someone has been coming with her medications "wrapped up."     Extremity/Trunk Assessment   Upper Extremity Assessment Upper Extremity Assessment: Generalized weakness (h/o MS, bilat shld flex to about 90 deg. L UE with noted mild ataxia with finger to nose test)    Lower Extremity Assessment Lower Extremity Assessment: Generalized weakness    Cervical / Trunk Assessment Cervical / Trunk Assessment: Kyphotic  Communication   Communication Communication: Difficulty communicating thoughts/reduced clarity of speech  Cognition Arousal: Alert Behavior During Therapy: Restless, Anxious, Impulsive Overall Cognitive Status: Impaired/Different from baseline Area of Impairment: Memory, Following commands, Awareness, Safety/judgement, Problem solving, Orientation                 Orientation Level: Disoriented to, Time, Situation (aware she is in the hospital but upset that she is because she doesn't want to be here and doesn't know why she is here)   Memory: Decreased short-term memory Following Commands: Follows one step commands with increased time Safety/Judgement: Decreased awareness of deficits, Decreased awareness of safety Awareness: Emergent Problem Solving: Requires verbal cues, Requires tactile cues General Comments: pt impulsive with decreased insight to her situation and deficits. Pt confused with word finding difficulties and difficulty at time putting sentences together        General Comments General comments (skin integrity, edema, etc.): VSS    Exercises     Assessment/Plan    PT Assessment Patient needs continued PT services  PT Problem List Decreased strength;Decreased range of motion;Decreased activity tolerance;Decreased balance;Decreased mobility;Decreased coordination;Decreased cognition       PT Treatment Interventions DME instruction;Gait  training;Functional mobility training;Therapeutic activities;Therapeutic exercise;Balance training;Neuromuscular re-education;Cognitive remediation    PT Goals (Current goals can be found in the Care Plan section)  Acute Rehab PT Goals Patient Stated Goal: home PT Goal Formulation: With patient Time For Goal Achievement: 01/10/23 Potential to Achieve Goals: Good    Frequency Min 1X/week     Co-evaluation               AM-PAC PT "6 Clicks" Mobility  Outcome Measure Help needed turning from your back to your side while in a flat bed without using bedrails?: A Little Help needed moving from lying on your back to sitting on the side of a flat bed without using bedrails?: A Little Help needed moving to and from a bed to a chair (including a wheelchair)?: A Little Help needed standing up from a chair using your arms (e.g., wheelchair or bedside chair)?: A Little Help needed to walk in hospital room?: A Lot Help needed climbing 3-5 steps with a railing? : A Lot 6 Click Score: 16    End of Session Equipment Utilized During Treatment: Gait belt Activity Tolerance: Patient tolerated treatment well Patient left: in chair;with call bell/phone within reach;with chair alarm set Nurse Communication: Mobility status PT Visit Diagnosis: Unsteadiness on feet (R26.81);Muscle weakness (generalized) (M62.81);History of falling (Z91.81)    Time: 8416-6063 PT Time Calculation (min) (ACUTE ONLY): 25 min   Charges:   PT Evaluation $PT Eval Moderate Complexity: 1 Mod PT Treatments $Gait Training: 8-22 mins PT General  Charges $$ ACUTE PT VISIT: 1 Visit         Lewis Shock, PT, DPT Acute Rehabilitation Services Secure chat preferred Office #: [phone]   Iona Hansen 12/27/2022, 2:05 PM

## 2022-12-28 ENCOUNTER — Encounter (HOSPITAL_COMMUNITY): Payer: Self-pay

## 2022-12-28 ENCOUNTER — Emergency Department (HOSPITAL_COMMUNITY): Payer: 59

## 2022-12-28 DIAGNOSIS — G319 Degenerative disease of nervous system, unspecified: Secondary | ICD-10-CM | POA: Diagnosis not present

## 2022-12-28 DIAGNOSIS — J323 Chronic sphenoidal sinusitis: Secondary | ICD-10-CM | POA: Diagnosis not present

## 2022-12-28 DIAGNOSIS — R4701 Aphasia: Secondary | ICD-10-CM | POA: Diagnosis not present

## 2022-12-28 DIAGNOSIS — G35 Multiple sclerosis: Secondary | ICD-10-CM | POA: Diagnosis not present

## 2022-12-28 LAB — WET PREP, GENITAL
Clue Cells Wet Prep HPF POC: NONE SEEN
Sperm: NONE SEEN
Trich, Wet Prep: NONE SEEN
WBC, Wet Prep HPF POC: <10 (ref <10)
Yeast Wet Prep HPF POC: NONE SEEN

## 2022-12-28 MED ORDER — GADOBUTROL 1 MMOL/ML IV SOLN
7.0000 mL | Freq: Once | INTRAVENOUS | Status: AC | PRN
  Administered 2022-12-28: 7 mL via INTRAVENOUS

## 2022-12-28 MED ORDER — NYSTATIN 100000 UNIT/GM EX POWD
Freq: Three times a day (TID) | CUTANEOUS | Status: DC
  Filled 2022-12-28: qty 15

## 2022-12-28 MED ORDER — LORAZEPAM 0.5 MG PO TABS: 0.5000 mg | ORAL_TABLET | ORAL | Status: DC | PRN

## 2022-12-28 MED ORDER — LORAZEPAM 2 MG/ML IJ SOLN
1.0000 mg | INTRAMUSCULAR | Status: AC | PRN
  Administered 2022-12-28: 1 mg via INTRAVENOUS
  Filled 2022-12-28: qty 1

## 2022-12-28 NOTE — ED Notes (Signed)
Pt woke up and asked for something to eat. Peanut butter and crackers with a lemon lime shasta provided.

## 2022-12-28 NOTE — ED Notes (Signed)
Pt transported to MRI 

## 2022-12-28 NOTE — ED Notes (Signed)
Pt ambulated to restroom with NT & back to bed.

## 2022-12-28 NOTE — ED Provider Notes (Signed)
Emergency Medicine Observation Re-evaluation Note  [name] is a 67 y.o. female, seen on rounds today.  Pt initially presented to the ED for complaints of Aphasia Currently, the patient is up eating breakfast. She feels much better today.   Physical Exam  BP (!) 121/93 (BP Location: Right Arm)   Pulse 89   Temp 98 F (36.7 C) (Oral)   Resp 18   Ht 5\' 2"  (1.575 m)   Wt 71.4 kg   SpO2 95%   BMI 28.79 kg/m  Physical Exam General: NAD Neuro: Cranial nerves 2-12 intact, 4/5 strength in the left upper extremity and left lower extremity, 5 out of 5 strength in the right upper extremity and right lower extremity.  Patient states that weakness in the left hemibody is her baseline.    ED Course / MDM  EKG:EKG Interpretation Date/Time:  Friday December 26 2022 17:03:03 EDT Ventricular Rate:  77 PR Interval:  142 QRS Duration:  99 QT Interval:  392 QTC Calculation: 444 R Axis:   5  Text Interpretation: Sinus rhythm Paired ventricular premature complexes Minimal ST depression, lateral leads Confirmed by Pricilla Loveless [phone]) on 12/26/2022 5:10:45 PM  I have reviewed the labs performed to date as well as medications administered while in observation.  Recent changes in the last 24 hours include the patient continues to have expressive aphasia per nursing with occasional word finding, On my exam the patient appears to be GCS 15 and not aphasic. Her left hemibody weakness is at her baseline. However, on chart review, the patient was unable to tolerate a full MRI W contrast and imaging was limited. I discussed the care of the patient with Dr. Otelia Limes who reviewed the patient's MRI imaging. Based on MRI review, no evidence for stroke, however given the limitations and her known MS, can try for repeat MRI with contrast today, if negative, continue current plan for placement.   1752 MRI imaging pending but completed.  The patient mentioned to nursing that she has been having some vaginal  discharge with a fishy odor noted to her vagina.  Pelvic exam was performed with evidence of a beefy red rash in the patient's groin consistent with a likely candidal dermatitis.  A wet prep was collected and resulted negative. The patient was prescribed nystatin powder for her rash.  1830 MRI resulted, similar to prior studies: IMPRESSION:  1. No acute intracranial abnormality or significant interval change.  2. Confluent periventricular T2 hyperintensities bilaterally are  consistent with the given diagnosis of multiple sclerosis.  3. Mild generalized atrophy.  4. Left sphenoid sinus disease.     Plan  Current plan is for SW for SNF.        Ernie Avena, MD 12/28/22 [id number]

## 2022-12-28 NOTE — ED Notes (Signed)
Pt ambulating around in room w/ Rolator walker and tolerating well.

## 2022-12-28 NOTE — Progress Notes (Addendum)
CSW spoke with pt's sister Darl Pikes to present bed offers. Pt's sister is requesting time to review. TOC to follow.   Adden  12:10pm  CSW received a call from pt's sister susan, pt's sister stated her concerns with pt's bed offers. The pt's sister stated she would like to wait and speak with [employer] as several other family members are on their waiting list.  CSW informed her that the pt wouldn't be able to wait days for her preferred facility, due to her being in the ER. CSW explained pt still needs insurance authorization as well. Pt's sister stated she would be coming to visit pt today to discuss bed choice. TOC to follow.   2:30 pm CSW called pt's sister Darl Pikes to discuss bed choice, she reports pt is still being evaluated and she does not want to make a decision until she knows pt is medically stable.  CSW verify with pt's RN Irving Burton, and she reports pt is having an MRI done to see if she will need to be admitted. Per chart review pt ambulating in room with RW. TOC to follow .   Valentina Shaggy.Genavieve Mangiapane, MSW, LCSWA [facility]  Transitions of Care Clinical Social Worker I Direct Dial: [phone]  Fax: [phone] [email or web address]

## 2022-12-29 NOTE — Progress Notes (Signed)
CSW spoke with patient who accepts the bed offer at [facility]. CSW verified with [place] in admissions of the bed offer. CSW will start insurance auth.

## 2022-12-29 NOTE — ED Provider Notes (Addendum)
  Physical Exam  BP (!) 157/83 (BP Location: Right Arm)   Pulse 74   Temp 98.1 F (36.7 C) (Oral)   Resp 15   Ht 5\' 2"  (1.575 m)   Wt 71.4 kg   SpO2 94%   BMI 28.79 kg/m   Physical Exam  Procedures  Procedures  ED Course / MDM    Medical Decision Making Amount and/or Complexity of Data Reviewed Labs: ordered. Radiology: ordered.  Risk OTC drugs. Prescription drug management.   Pending skilled nursing placement.  MRI with contrast done yesterday and does not show active MS.    Patient is medically cleared.  Well-appearing this morning.  States she just wants to go home.  Patient does not appear to be involuntary committed.  Have followed up with nurse on dispo plans.      Benjiman Core, MD 12/29/22 [id number]    Benjiman Core, MD 12/29/22 [phone]

## 2022-12-29 NOTE — Progress Notes (Signed)
Physical Therapy Treatment Patient Details Name: [name] MRN: [id number] DOB: 04/07/1955 Today's Date: 12/29/2022   History of Present Illness Pt is a 67yo female who was brought to ED due to AMS, unsteady gait, and aphasia. PMH: MS, migraines, HTN    PT Comments  Pt steadily progressing toward goals,  showing improved mentation, still mildly unsteady.  Emphasis on general safety, safe sit to stands and safety with gait using the rollator for stability/stamina.     If plan is discharge home, recommend the following: A little help with walking and/or transfers;A little help with bathing/dressing/bathroom;Supervision due to cognitive status;Assist for transportation   Can travel by private vehicle     Yes  Equipment Recommendations  None recommended by PT    Recommendations for Other Services       Precautions / Restrictions Precautions Precautions: Fall     Mobility  Bed Mobility Overal bed mobility: Needs Assistance Bed Mobility: Supine to Sit     Supine to sit: Contact guard          Transfers Overall transfer level: Needs assistance Equipment used: Rollator (4 wheels) Transfers: Sit to/from Stand Sit to Stand: Contact guard assist           General transfer comment: needed cues for setting the rollator brakes.  min stabilizing the rollator.    Ambulation/Gait Ambulation/Gait assistance: Contact guard assist Gait Distance (Feet): 200 Feet (then 25 feet after sitting rest.  safe use of the Rollator to sit.) Assistive device: Rollator (4 wheels) Gait Pattern/deviations: Step-through pattern, Decreased stride length, Staggering left, Staggering right, Narrow base of support, Trunk flexed   Gait velocity interpretation: <1.31 ft/sec, indicative of household ambulator   General Gait Details: short , mildly unsteady steps, but uses the Rollator safely.   Stairs             Wheelchair Mobility     Tilt Bed    Modified Rankin (Stroke Patients  Only) Modified Rankin (Stroke Patients Only) Modified Rankin: Moderate disability     Balance     Sitting balance-Leahy Scale: Fair       Standing balance-Leahy Scale: Poor Standing balance comment: reliant on RW                            Cognition Arousal: Alert Behavior During Therapy: Restless Overall Cognitive Status:  (improved cognition, but likely not quite baselinel.)                             Awareness: Emergent            Exercises      General Comments        Pertinent Vitals/Pain Pain Assessment Pain Assessment: No/denies pain    Home Living                          Prior Function            PT Goals (current goals can now be found in the care plan section) Acute Rehab PT Goals Patient Stated Goal: home PT Goal Formulation: With patient Time For Goal Achievement: 01/10/23 Potential to Achieve Goals: Good Progress towards PT goals: Progressing toward goals    Frequency    Min 1X/week      PT Plan      Co-evaluation  AM-PAC PT "6 Clicks" Mobility   Outcome Measure  Help needed turning from your back to your side while in a flat bed without using bedrails?: A Little Help needed moving from lying on your back to sitting on the side of a flat bed without using bedrails?: A Little Help needed moving to and from a bed to a chair (including a wheelchair)?: A Little Help needed standing up from a chair using your arms (e.g., wheelchair or bedside chair)?: A Little Help needed to walk in hospital room?: A Little Help needed climbing 3-5 steps with a railing? : A Lot 6 Click Score: 17    End of Session   Activity Tolerance: Patient tolerated treatment well Patient left: in chair;with call bell/phone within reach Nurse Communication: Mobility status PT Visit Diagnosis: Unsteadiness on feet (R26.81);Muscle weakness (generalized) (M62.81);History of falling (Z91.81)     Time:  2956-2130 PT Time Calculation (min) (ACUTE ONLY): 24 min  Charges:    $Gait Training: 8-22 mins $Therapeutic Activity: 8-22 mins PT General Charges $$ ACUTE PT VISIT: 1 Visit                     12/29/2022  Jacinto Halim., PT Acute Rehabilitation Services [phone]  (office)   [name] 12/29/2022, 5:04 PM

## 2022-12-29 NOTE — Progress Notes (Signed)
Insurance authorization approved. Patient will discharge to [facility] tomorrow

## 2022-12-29 NOTE — Progress Notes (Signed)
[name] pending [id number]

## 2022-12-29 NOTE — ED Notes (Signed)
Pt had an episode of diarrhea. Changed her bed and gown. Pt requesting medication. Sent private chat to provider.

## 2022-12-30 DIAGNOSIS — E876 Hypokalemia: Secondary | ICD-10-CM | POA: Diagnosis not present

## 2022-12-30 DIAGNOSIS — Z79899 Other long term (current) drug therapy: Secondary | ICD-10-CM | POA: Diagnosis not present

## 2022-12-30 DIAGNOSIS — R413 Other amnesia: Secondary | ICD-10-CM | POA: Diagnosis not present

## 2022-12-30 DIAGNOSIS — R4701 Aphasia: Secondary | ICD-10-CM | POA: Diagnosis not present

## 2022-12-30 DIAGNOSIS — I700 Atherosclerosis of aorta: Secondary | ICD-10-CM | POA: Diagnosis not present

## 2022-12-30 DIAGNOSIS — I10 Essential (primary) hypertension: Secondary | ICD-10-CM | POA: Diagnosis not present

## 2022-12-30 DIAGNOSIS — M5481 Occipital neuralgia: Secondary | ICD-10-CM | POA: Diagnosis not present

## 2022-12-30 DIAGNOSIS — R4182 Altered mental status, unspecified: Secondary | ICD-10-CM | POA: Diagnosis not present

## 2022-12-30 DIAGNOSIS — R2681 Unsteadiness on feet: Secondary | ICD-10-CM | POA: Diagnosis not present

## 2022-12-30 DIAGNOSIS — R2689 Other abnormalities of gait and mobility: Secondary | ICD-10-CM | POA: Diagnosis not present

## 2022-12-30 DIAGNOSIS — G35 Multiple sclerosis: Secondary | ICD-10-CM | POA: Diagnosis not present

## 2022-12-30 DIAGNOSIS — G319 Degenerative disease of nervous system, unspecified: Secondary | ICD-10-CM | POA: Diagnosis not present

## 2022-12-30 DIAGNOSIS — Z9181 History of falling: Secondary | ICD-10-CM | POA: Diagnosis not present

## 2022-12-30 DIAGNOSIS — I6782 Cerebral ischemia: Secondary | ICD-10-CM | POA: Diagnosis not present

## 2022-12-30 DIAGNOSIS — R5383 Other fatigue: Secondary | ICD-10-CM | POA: Diagnosis not present

## 2022-12-30 DIAGNOSIS — M542 Cervicalgia: Secondary | ICD-10-CM | POA: Diagnosis not present

## 2022-12-30 DIAGNOSIS — M6281 Muscle weakness (generalized): Secondary | ICD-10-CM | POA: Diagnosis not present

## 2022-12-30 NOTE — ED Provider Notes (Signed)
Emergency Medicine Observation Re-evaluation Note  [name] is a 68 y.o. female, w/ MS presented w/ AMS, aphasia, seen on rounds today.  Pt initially presented to the ED for complaints of Aphasia Currently, the patient is walking around the department with her walker. She is pleasant and A&Ox4. She has eaten breakfast. She is excited to be discharged and wants to begin her rehabilitation to get stronger.  She is only concerned because she has a cat living at home and she is worried about someone being able to take care of the cat.  She has no complaints this morning, denies any pain.  Physical Exam  BP (!) 132/91 (BP Location: Left Arm)   Pulse 89   Temp 97.6 F (36.4 C) (Oral)   Resp 16   Ht 5\' 2"  (1.575 m)   Wt 71.4 kg   SpO2 99%   BMI 28.79 kg/m  Physical Exam General: Alert and oriented, ambulatory with her walker around the department Cardiac: Well-perfused Lungs: No respiratory distress Psych/neuro: Pleasant, not aphasic, moves all extremities  ED Course / MDM  EKG:EKG Interpretation Date/Time:  Friday December 26 2022 17:03:03 EDT Ventricular Rate:  77 PR Interval:  142 QRS Duration:  99 QT Interval:  392 QTC Calculation: 444 R Axis:   5  Text Interpretation: Sinus rhythm Paired ventricular premature complexes Minimal ST depression, lateral leads Confirmed by Pricilla Loveless [phone]) on 12/26/2022 5:10:45 PM  I have reviewed the labs performed to date as well as medications administered while in observation. MRI on 12/28/22 was performed and similar to prior studies.   Plan  Current plan is for discharge to [facility] rehab for SNF.  She is given discharge instructions and return precautions, all questions answered to patient satisfaction.    Loetta Rough, MD 12/30/22 [phone]

## 2022-12-30 NOTE — ED Notes (Signed)
Gave report to Mitzi Davenport, RN at [facility] at  10:53

## 2022-12-30 NOTE — Discharge Instructions (Signed)
Thank you for coming to [facility] Emergency Department. You were seen for altered mental status, difficulty speaking. We did an exam, labs, and imaging, and these showed changes consistent with your known multiple sclerosis and mildly low potassium.  Your evaluated in the emergency department with MRI and other labs.  You will be discharged to [facility] rehabilitation facility. Please follow up with your primary care provider within 1 week.   Do not hesitate to return to the ED or call 911 if you experience: -Worsening symptoms -Difficulty speaking, slurred speech, confusion -Asymmetric numbness tingling, asymmetric weakness -Facial droop -Falls or head trauma -Visual changes -Severe headache -Lightheadedness, passing out -Fevers/chills -Anything else that concerns you

## 2022-12-30 NOTE — Progress Notes (Signed)
Patient discharging to [facility] rehab. Patient will go to room 502p and number for report is [phone]. Information provided to patients RN.

## 2023-01-07 ENCOUNTER — Telehealth: Payer: Self-pay | Admitting: Neurology

## 2023-01-07 NOTE — Telephone Encounter (Signed)
Pt's sister called wanting to inform the provide that the pt has over medicated her self on accident and is in the hospital till Monday. Pt had a MRI of the Brain while in hospital and can be seen on Epic. Sister has requested that all of the pt's medications be sent now to the [facility] pharmacy and [facility] must be marked. Sister also states that [facility] takes 2 weeks and she is wanting to know if two weeks worth of medications can be sent to the [facility] on [address] and [address]. PCP has also been informed and will be sending new Rx's to the [facility] as well. Also Darl Pikes stated that the pt's traMADol (ULTRAM) 50 MG tablet can not be as needed due to pt just taking it with any little pain she has. Please advise.

## 2023-01-08 ENCOUNTER — Ambulatory Visit: Payer: 59 | Admitting: Neurology

## 2023-01-08 DIAGNOSIS — G35 Multiple sclerosis: Secondary | ICD-10-CM | POA: Diagnosis not present

## 2023-01-08 DIAGNOSIS — M6281 Muscle weakness (generalized): Secondary | ICD-10-CM | POA: Diagnosis not present

## 2023-01-08 DIAGNOSIS — R2689 Other abnormalities of gait and mobility: Secondary | ICD-10-CM | POA: Diagnosis not present

## 2023-01-08 MED ORDER — BACLOFEN 10 MG PO TABS: ORAL_TABLET | ORAL | 0 refills | Status: DC

## 2023-01-08 MED ORDER — DALFAMPRIDINE ER 10 MG PO TB12: ORAL_TABLET | ORAL | 5 refills | Status: DC

## 2023-01-08 MED ORDER — GABAPENTIN 100 MG PO CAPS: ORAL_CAPSULE | ORAL | 5 refills | Status: DC

## 2023-01-08 MED ORDER — SOLIFENACIN SUCCINATE 10 MG PO TABS: 10.0000 mg | ORAL_TABLET | Freq: Every day | ORAL | 0 refills | Status: DC

## 2023-01-08 MED ORDER — ROPINIROLE HCL 0.5 MG PO TABS: ORAL_TABLET | ORAL | 5 refills | Status: DC

## 2023-01-08 MED ORDER — SOLIFENACIN SUCCINATE 10 MG PO TABS: 10.0000 mg | ORAL_TABLET | Freq: Every day | ORAL | 5 refills | Status: DC

## 2023-01-08 MED ORDER — SERTRALINE HCL 100 MG PO TABS: 150.0000 mg | ORAL_TABLET | Freq: Every day | ORAL | 5 refills | Status: DC

## 2023-01-08 MED ORDER — BUSPIRONE HCL 15 MG PO TABS: ORAL_TABLET | ORAL | 5 refills | Status: AC

## 2023-01-08 MED ORDER — BACLOFEN 10 MG PO TABS: ORAL_TABLET | ORAL | 5 refills | Status: DC

## 2023-01-08 NOTE — Telephone Encounter (Addendum)
Pt last seen 04/03/22. Cx appt today and r/s for 07/15/23.   Dr. Epimenio Foot writes the following prescriptions: Vesicare, Zoloft, Requip, Buspar, Baclofen, gabapentin and dalfampridine.  Last refilled gabapentin 12/29/22 #90 at [facility].  Last refilled zoloft 12/24/22 #45 at [facility] Last refilled Requip 12/24/22 #120 at [facility] Last refilled Buspar 12/24/22 #60 at [facility] Last refilled dalfampridine 12/24/22 #60 At [facility]   Too soon to refill at local for above prescriptions.  Ok to send to [facility] pill pack pharmacy per Dr. Epimenio Foot   Last refilled Baclofen 11/26/22 #90 at [facility] Last refilled Vesicare 11/26/22 #30 at [facility] at  [phone]. Spoke w/ Sam. Cx all prescriptions on file under Dr. Epimenio Foot since we will be changing pharmacies.   I spoke w/ Dr. Epimenio Foot who agreed to send refills for baclofen, vesicare to [facility] for 2 wk supply and any future refills to [facility]. I called sister. She is going to call her PCP to d/c tramadol so she can stay on dalfampridine. She states she has her medications at home. Will look for gabapentin, zoloft, requip, buspar and dalfampridine. Aware we will send in refills for baclofen and vesicare to [facility] for 2 wk supply. Aware we sx any prescriptions on file at [facility].   Pt out of hospital, at [facility]/rehab. Being discharged to home 01/12/23. She has improved and back to baseline.

## 2023-01-12 DIAGNOSIS — G35 Multiple sclerosis: Secondary | ICD-10-CM | POA: Diagnosis not present

## 2023-01-12 DIAGNOSIS — E876 Hypokalemia: Secondary | ICD-10-CM | POA: Diagnosis not present

## 2023-01-12 DIAGNOSIS — M5481 Occipital neuralgia: Secondary | ICD-10-CM | POA: Diagnosis not present

## 2023-01-12 DIAGNOSIS — G43909 Migraine, unspecified, not intractable, without status migrainosus: Secondary | ICD-10-CM | POA: Diagnosis not present

## 2023-01-12 DIAGNOSIS — I10 Essential (primary) hypertension: Secondary | ICD-10-CM | POA: Diagnosis not present

## 2023-01-15 DIAGNOSIS — M5481 Occipital neuralgia: Secondary | ICD-10-CM | POA: Diagnosis not present

## 2023-01-15 DIAGNOSIS — G35 Multiple sclerosis: Secondary | ICD-10-CM | POA: Diagnosis not present

## 2023-01-15 DIAGNOSIS — I10 Essential (primary) hypertension: Secondary | ICD-10-CM | POA: Diagnosis not present

## 2023-01-15 DIAGNOSIS — G43909 Migraine, unspecified, not intractable, without status migrainosus: Secondary | ICD-10-CM | POA: Diagnosis not present

## 2023-01-15 DIAGNOSIS — E876 Hypokalemia: Secondary | ICD-10-CM | POA: Diagnosis not present

## 2023-01-19 ENCOUNTER — Ambulatory Visit: Payer: Medicare Other

## 2023-01-19 ENCOUNTER — Telehealth: Payer: Self-pay | Admitting: Neurology

## 2023-01-19 DIAGNOSIS — G43909 Migraine, unspecified, not intractable, without status migrainosus: Secondary | ICD-10-CM | POA: Diagnosis not present

## 2023-01-19 DIAGNOSIS — I10 Essential (primary) hypertension: Secondary | ICD-10-CM | POA: Diagnosis not present

## 2023-01-19 DIAGNOSIS — G35 Multiple sclerosis: Secondary | ICD-10-CM | POA: Diagnosis not present

## 2023-01-19 DIAGNOSIS — M5481 Occipital neuralgia: Secondary | ICD-10-CM | POA: Diagnosis not present

## 2023-01-19 DIAGNOSIS — E876 Hypokalemia: Secondary | ICD-10-CM | POA: Diagnosis not present

## 2023-01-19 NOTE — Telephone Encounter (Signed)
Pt's sister, Alita Chyle requesting to cancel  all prescriptions  to [email or web address] and transfer all prescriptions to  [facility] Phone: [phone]  [facility] sent a request last Friday.

## 2023-01-19 NOTE — Telephone Encounter (Signed)
Called [facility] at  [phone]. Spoke w/ Mal Amabile. They have prescription on file sent 01/08/23 and confirmed it was pill pack.   Called sister back. She had bad service and asked I call her back at her work # [phone]. I called back and spoke with her. Insurance will not cover regular pharmacy. Has to go to pill pack. Advised I called this am and confirmed gabapentin, zoloft, requip, buspar, dalfampridine, baclofen and vesicare on file. She verbalized understanding and will call back to get this processed. She will call us back if any further issues.

## 2023-01-20 DIAGNOSIS — E876 Hypokalemia: Secondary | ICD-10-CM | POA: Diagnosis not present

## 2023-01-20 DIAGNOSIS — G35 Multiple sclerosis: Secondary | ICD-10-CM | POA: Diagnosis not present

## 2023-01-20 DIAGNOSIS — I10 Essential (primary) hypertension: Secondary | ICD-10-CM | POA: Diagnosis not present

## 2023-01-20 DIAGNOSIS — G43909 Migraine, unspecified, not intractable, without status migrainosus: Secondary | ICD-10-CM | POA: Diagnosis not present

## 2023-01-20 DIAGNOSIS — M5481 Occipital neuralgia: Secondary | ICD-10-CM | POA: Diagnosis not present

## 2023-01-22 DIAGNOSIS — G35 Multiple sclerosis: Secondary | ICD-10-CM | POA: Diagnosis not present

## 2023-01-22 DIAGNOSIS — M5481 Occipital neuralgia: Secondary | ICD-10-CM | POA: Diagnosis not present

## 2023-01-22 DIAGNOSIS — E876 Hypokalemia: Secondary | ICD-10-CM | POA: Diagnosis not present

## 2023-01-22 DIAGNOSIS — I10 Essential (primary) hypertension: Secondary | ICD-10-CM | POA: Diagnosis not present

## 2023-01-22 DIAGNOSIS — G43909 Migraine, unspecified, not intractable, without status migrainosus: Secondary | ICD-10-CM | POA: Diagnosis not present

## 2023-01-23 DIAGNOSIS — I10 Essential (primary) hypertension: Secondary | ICD-10-CM | POA: Diagnosis not present

## 2023-01-23 DIAGNOSIS — M5481 Occipital neuralgia: Secondary | ICD-10-CM | POA: Diagnosis not present

## 2023-01-23 DIAGNOSIS — G43909 Migraine, unspecified, not intractable, without status migrainosus: Secondary | ICD-10-CM | POA: Diagnosis not present

## 2023-01-23 DIAGNOSIS — G35 Multiple sclerosis: Secondary | ICD-10-CM | POA: Diagnosis not present

## 2023-01-23 DIAGNOSIS — E876 Hypokalemia: Secondary | ICD-10-CM | POA: Diagnosis not present

## 2023-01-27 DIAGNOSIS — E876 Hypokalemia: Secondary | ICD-10-CM | POA: Diagnosis not present

## 2023-01-27 DIAGNOSIS — G35 Multiple sclerosis: Secondary | ICD-10-CM | POA: Diagnosis not present

## 2023-01-27 DIAGNOSIS — G43909 Migraine, unspecified, not intractable, without status migrainosus: Secondary | ICD-10-CM | POA: Diagnosis not present

## 2023-01-27 DIAGNOSIS — M5481 Occipital neuralgia: Secondary | ICD-10-CM | POA: Diagnosis not present

## 2023-01-27 DIAGNOSIS — I10 Essential (primary) hypertension: Secondary | ICD-10-CM | POA: Diagnosis not present

## 2023-01-28 ENCOUNTER — Telehealth: Payer: Self-pay | Admitting: Neurology

## 2023-01-28 MED ORDER — GABAPENTIN 100 MG PO CAPS: ORAL_CAPSULE | ORAL | 0 refills | Status: DC

## 2023-01-28 MED ORDER — SOLIFENACIN SUCCINATE 10 MG PO TABS: 10.0000 mg | ORAL_TABLET | Freq: Every day | ORAL | 0 refills | Status: DC

## 2023-01-28 NOTE — Telephone Encounter (Signed)
Per records, pt filled gabapentin at [facility] pharmacy 01/20/23 #150 and solifenacin 01/20/23 30 at [facility].

## 2023-01-28 NOTE — Telephone Encounter (Signed)
  Pt's sister, Eloy End, pt needs 5 days of these medications to fill pill bottle until [facility] starts November 15; gabapentin (NEURONTIN) 100 MG capsule and solifenacin (VESICARE) 10 MG tablet. Can be reached at [phone] due to have left cellphone at home.

## 2023-01-28 NOTE — Telephone Encounter (Signed)
Called sister. Relayed below info. She states she never got these prescriptions from [facility] pill pack pharmacy. Aware I will call pharmacy and call her back.   Called pharmacy and spoke w/ Laura/pharmacist.  Last refilled gabapentin 100mg  (went out 01/25/23 for 11/15-12/14. She will receive today) Last refilled solifenacin 10mg  (went out 01/25/23 fro 11/15-12/14. She will receive today) Pill pack date starts 02/06/23.   Needs medication from now until then to get her through until pill pack starts.

## 2023-01-28 NOTE — Telephone Encounter (Addendum)
Spoke w/ Dr. Epimenio Foot. He was ok to send in 9 days worth of refills for gabapentin and solifenacin.  Called sister back. States she filled up her pill bottles and she has enough for this week. Only needs 5 days worth of refills. I e-scribed refills to [id number] - [place], [place] - [address] AT [facility] [address].

## 2023-01-29 DIAGNOSIS — G35 Multiple sclerosis: Secondary | ICD-10-CM | POA: Diagnosis not present

## 2023-01-29 DIAGNOSIS — I10 Essential (primary) hypertension: Secondary | ICD-10-CM | POA: Diagnosis not present

## 2023-01-29 DIAGNOSIS — G43909 Migraine, unspecified, not intractable, without status migrainosus: Secondary | ICD-10-CM | POA: Diagnosis not present

## 2023-01-29 DIAGNOSIS — M5481 Occipital neuralgia: Secondary | ICD-10-CM | POA: Diagnosis not present

## 2023-01-29 DIAGNOSIS — E876 Hypokalemia: Secondary | ICD-10-CM | POA: Diagnosis not present

## 2023-02-02 DIAGNOSIS — G43909 Migraine, unspecified, not intractable, without status migrainosus: Secondary | ICD-10-CM | POA: Diagnosis not present

## 2023-02-02 DIAGNOSIS — E876 Hypokalemia: Secondary | ICD-10-CM | POA: Diagnosis not present

## 2023-02-02 DIAGNOSIS — M5481 Occipital neuralgia: Secondary | ICD-10-CM | POA: Diagnosis not present

## 2023-02-02 DIAGNOSIS — I10 Essential (primary) hypertension: Secondary | ICD-10-CM | POA: Diagnosis not present

## 2023-02-02 DIAGNOSIS — G35 Multiple sclerosis: Secondary | ICD-10-CM | POA: Diagnosis not present

## 2023-02-03 ENCOUNTER — Telehealth: Payer: Self-pay | Admitting: Neurology

## 2023-02-03 NOTE — Telephone Encounter (Signed)
Called pt.  She noted that she has had issues with loose stools on and off for years.  She is sometimes is not able to get to [place] on time w/o an accident happening. Urine is ok.  Bowel movements are soft (formed) not diarrhea.  She is not having any other symptoms relating to MS exacerbation.  She is seeing her pcp on 02-26-2023 for eval. She is hydrating with water, takes metamucil.  No changes in medications.  She is not on DMT. I told her that I did not think was related to her MS.  I was glad she had appt with pcp to eval.  She wanted me to send to you as well.

## 2023-02-03 NOTE — Telephone Encounter (Signed)
At 2:17 Pt left a vm stating there has been a change in her bowel movements, she will see her PCP this week, but wants to know if this is expected as a result of her MS.

## 2023-02-04 NOTE — Telephone Encounter (Signed)
I called pt and relayed Dr. Bonnita Hollow message. She verbalized understanding.  Pt was doing ok today.  She was appreciative of call back.

## 2023-02-05 DIAGNOSIS — G35 Multiple sclerosis: Secondary | ICD-10-CM | POA: Diagnosis not present

## 2023-02-05 DIAGNOSIS — M5481 Occipital neuralgia: Secondary | ICD-10-CM | POA: Diagnosis not present

## 2023-02-05 DIAGNOSIS — E876 Hypokalemia: Secondary | ICD-10-CM | POA: Diagnosis not present

## 2023-02-05 DIAGNOSIS — G43909 Migraine, unspecified, not intractable, without status migrainosus: Secondary | ICD-10-CM | POA: Diagnosis not present

## 2023-02-05 DIAGNOSIS — I10 Essential (primary) hypertension: Secondary | ICD-10-CM | POA: Diagnosis not present

## 2023-02-06 DIAGNOSIS — E876 Hypokalemia: Secondary | ICD-10-CM | POA: Diagnosis not present

## 2023-02-06 DIAGNOSIS — G43909 Migraine, unspecified, not intractable, without status migrainosus: Secondary | ICD-10-CM | POA: Diagnosis not present

## 2023-02-06 DIAGNOSIS — I10 Essential (primary) hypertension: Secondary | ICD-10-CM | POA: Diagnosis not present

## 2023-02-06 DIAGNOSIS — M5481 Occipital neuralgia: Secondary | ICD-10-CM | POA: Diagnosis not present

## 2023-02-06 DIAGNOSIS — G35 Multiple sclerosis: Secondary | ICD-10-CM | POA: Diagnosis not present

## 2023-02-09 ENCOUNTER — Telehealth: Payer: Self-pay | Admitting: Neurology

## 2023-02-09 NOTE — Telephone Encounter (Signed)
Per Mack Hook, paperwork received. Currently working on.

## 2023-02-09 NOTE — Telephone Encounter (Signed)
Pt called and LVM wanting to know if the paperwork she mailed regarding transportation has been received. Pt is needing to know soon so that she does not miss the deadline. Please advise.

## 2023-02-10 ENCOUNTER — Ambulatory Visit
Admission: RE | Admit: 2023-02-10 | Discharge: 2023-02-10 | Disposition: A | Discharge status: Home / Self Care | Payer: 59 | Source: Ambulatory Visit | Attending: Family Medicine | Admitting: Family Medicine

## 2023-02-10 DIAGNOSIS — G43909 Migraine, unspecified, not intractable, without status migrainosus: Secondary | ICD-10-CM | POA: Diagnosis not present

## 2023-02-10 DIAGNOSIS — E876 Hypokalemia: Secondary | ICD-10-CM | POA: Diagnosis not present

## 2023-02-10 DIAGNOSIS — Z1231 Encounter for screening mammogram for malignant neoplasm of breast: Secondary | ICD-10-CM

## 2023-02-10 DIAGNOSIS — M5481 Occipital neuralgia: Secondary | ICD-10-CM | POA: Diagnosis not present

## 2023-02-10 DIAGNOSIS — G35 Multiple sclerosis: Secondary | ICD-10-CM | POA: Diagnosis not present

## 2023-02-10 DIAGNOSIS — I10 Essential (primary) hypertension: Secondary | ICD-10-CM | POA: Diagnosis not present

## 2023-02-10 NOTE — Telephone Encounter (Signed)
Called pt and let her know that her paperwork has been filled out and signed. Pt stated that she wanted her paperwork mailed to her.   Gave paperwork to front desk to mail out on 02/10/2023

## 2023-02-10 NOTE — Telephone Encounter (Signed)
Completed form given to medical records to process for pt.

## 2023-02-11 DIAGNOSIS — G43909 Migraine, unspecified, not intractable, without status migrainosus: Secondary | ICD-10-CM | POA: Diagnosis not present

## 2023-02-11 DIAGNOSIS — M5481 Occipital neuralgia: Secondary | ICD-10-CM | POA: Diagnosis not present

## 2023-02-11 DIAGNOSIS — E876 Hypokalemia: Secondary | ICD-10-CM | POA: Diagnosis not present

## 2023-02-11 DIAGNOSIS — G35 Multiple sclerosis: Secondary | ICD-10-CM | POA: Diagnosis not present

## 2023-02-11 DIAGNOSIS — I10 Essential (primary) hypertension: Secondary | ICD-10-CM | POA: Diagnosis not present

## 2023-02-17 DIAGNOSIS — E876 Hypokalemia: Secondary | ICD-10-CM | POA: Diagnosis not present

## 2023-02-17 DIAGNOSIS — I10 Essential (primary) hypertension: Secondary | ICD-10-CM | POA: Diagnosis not present

## 2023-02-17 DIAGNOSIS — G35 Multiple sclerosis: Secondary | ICD-10-CM | POA: Diagnosis not present

## 2023-02-17 DIAGNOSIS — M5481 Occipital neuralgia: Secondary | ICD-10-CM | POA: Diagnosis not present

## 2023-02-17 DIAGNOSIS — G43909 Migraine, unspecified, not intractable, without status migrainosus: Secondary | ICD-10-CM | POA: Diagnosis not present

## 2023-02-24 DIAGNOSIS — I10 Essential (primary) hypertension: Secondary | ICD-10-CM | POA: Diagnosis not present

## 2023-02-24 DIAGNOSIS — G43909 Migraine, unspecified, not intractable, without status migrainosus: Secondary | ICD-10-CM | POA: Diagnosis not present

## 2023-02-24 DIAGNOSIS — G35 Multiple sclerosis: Secondary | ICD-10-CM | POA: Diagnosis not present

## 2023-02-24 DIAGNOSIS — M5481 Occipital neuralgia: Secondary | ICD-10-CM | POA: Diagnosis not present

## 2023-02-24 DIAGNOSIS — E876 Hypokalemia: Secondary | ICD-10-CM | POA: Diagnosis not present

## 2023-02-26 DIAGNOSIS — I10 Essential (primary) hypertension: Secondary | ICD-10-CM | POA: Diagnosis not present

## 2023-02-26 DIAGNOSIS — K219 Gastro-esophageal reflux disease without esophagitis: Secondary | ICD-10-CM | POA: Diagnosis not present

## 2023-02-26 DIAGNOSIS — E7800 Pure hypercholesterolemia, unspecified: Secondary | ICD-10-CM | POA: Diagnosis not present

## 2023-02-26 DIAGNOSIS — M797 Fibromyalgia: Secondary | ICD-10-CM | POA: Diagnosis not present

## 2023-02-26 DIAGNOSIS — K5900 Constipation, unspecified: Secondary | ICD-10-CM | POA: Diagnosis not present

## 2023-02-26 DIAGNOSIS — G35 Multiple sclerosis: Secondary | ICD-10-CM | POA: Diagnosis not present

## 2023-02-26 DIAGNOSIS — G4700 Insomnia, unspecified: Secondary | ICD-10-CM | POA: Diagnosis not present

## 2023-03-03 ENCOUNTER — Telehealth: Payer: Self-pay | Admitting: Neurology

## 2023-03-03 DIAGNOSIS — E876 Hypokalemia: Secondary | ICD-10-CM | POA: Diagnosis not present

## 2023-03-03 DIAGNOSIS — I10 Essential (primary) hypertension: Secondary | ICD-10-CM | POA: Diagnosis not present

## 2023-03-03 DIAGNOSIS — G43909 Migraine, unspecified, not intractable, without status migrainosus: Secondary | ICD-10-CM | POA: Diagnosis not present

## 2023-03-03 DIAGNOSIS — M5481 Occipital neuralgia: Secondary | ICD-10-CM | POA: Diagnosis not present

## 2023-03-03 DIAGNOSIS — G35 Multiple sclerosis: Secondary | ICD-10-CM | POA: Diagnosis not present

## 2023-03-03 NOTE — Telephone Encounter (Signed)
At 4:19 yesterday afternoon pt left vm asking for a call from [place], [place] re: forms completed by Dr Epimenio Foot.  Pt states there are sections that were not completed by Dr Epimenio Foot that she would like to discuss with Kara Mead, RN please call her at [phone]

## 2023-03-03 NOTE — Telephone Encounter (Signed)
Noted  

## 2023-03-03 NOTE — Telephone Encounter (Signed)
Angela Kidney, do you mind calling pt to see what it is that she needs re form? Thank you

## 2023-03-10 NOTE — Telephone Encounter (Signed)
#  5 completed on form, pt informed. Form faxed [phone], copy filed in cabinet.

## 2023-03-23 ENCOUNTER — Telehealth: Payer: Self-pay | Admitting: Neurology

## 2023-03-23 DIAGNOSIS — R269 Unspecified abnormalities of gait and mobility: Secondary | ICD-10-CM

## 2023-03-23 DIAGNOSIS — G35 Multiple sclerosis: Secondary | ICD-10-CM

## 2023-03-23 DIAGNOSIS — M79604 Pain in right leg: Secondary | ICD-10-CM

## 2023-03-23 DIAGNOSIS — M25552 Pain in left hip: Secondary | ICD-10-CM

## 2023-03-23 NOTE — Telephone Encounter (Signed)
Order placed

## 2023-03-23 NOTE — Telephone Encounter (Signed)
[facility] is asking for a new HH PT order to be placed. I will send it once it's placed, thank you!

## 2023-03-23 NOTE — Addendum Note (Signed)
Addended by: Guy Begin on: 03/23/2023 04:34 PM   Modules accepted: Orders

## 2023-03-26 DIAGNOSIS — Z556 Problems related to health literacy: Secondary | ICD-10-CM | POA: Diagnosis not present

## 2023-03-26 DIAGNOSIS — M5481 Occipital neuralgia: Secondary | ICD-10-CM | POA: Diagnosis not present

## 2023-03-26 DIAGNOSIS — R4701 Aphasia: Secondary | ICD-10-CM | POA: Diagnosis not present

## 2023-03-26 DIAGNOSIS — G35 Multiple sclerosis: Secondary | ICD-10-CM | POA: Diagnosis not present

## 2023-03-26 DIAGNOSIS — I10 Essential (primary) hypertension: Secondary | ICD-10-CM | POA: Diagnosis not present

## 2023-03-26 DIAGNOSIS — G43909 Migraine, unspecified, not intractable, without status migrainosus: Secondary | ICD-10-CM | POA: Diagnosis not present

## 2023-03-31 ENCOUNTER — Telehealth: Payer: Self-pay | Admitting: Neurology

## 2023-03-31 DIAGNOSIS — G35 Multiple sclerosis: Secondary | ICD-10-CM | POA: Diagnosis not present

## 2023-03-31 DIAGNOSIS — I10 Essential (primary) hypertension: Secondary | ICD-10-CM | POA: Diagnosis not present

## 2023-03-31 DIAGNOSIS — Z556 Problems related to health literacy: Secondary | ICD-10-CM | POA: Diagnosis not present

## 2023-03-31 DIAGNOSIS — G43909 Migraine, unspecified, not intractable, without status migrainosus: Secondary | ICD-10-CM | POA: Diagnosis not present

## 2023-03-31 DIAGNOSIS — M5481 Occipital neuralgia: Secondary | ICD-10-CM | POA: Diagnosis not present

## 2023-03-31 DIAGNOSIS — R4701 Aphasia: Secondary | ICD-10-CM | POA: Diagnosis not present

## 2023-03-31 NOTE — Telephone Encounter (Signed)
 Pt L'VM at 11:36 am having to do with Dr.Sater fillout and faxing back to [employer] office. Wanted Dr. Vear to elaborate on [place] the city bus with [place] disabiltiy. Also can you increase the medication because urinating a great deal. Would like a call back.

## 2023-04-01 NOTE — Telephone Encounter (Signed)
 LVM at 2:44 pm Pt returning phone call, would like a call back.

## 2023-04-01 NOTE — Telephone Encounter (Signed)
 Pt called and she stated that she gets unstable and that is why she can't ride the [place] bus, and is wanting question 5 on her [employer] form. Pt is wanting her medication for urination increased due to  her having issues with her urination. Told pt we will update question 5 and refax her paperwork off. Pt verbalized understanding.

## 2023-04-01 NOTE — Telephone Encounter (Signed)
 I called patient to discuss.  No answer, left a voicemail asking her to call us back.  Please route to POD 1 when she calls back.

## 2023-04-02 NOTE — Telephone Encounter (Signed)
 Fixed Question 5 on SCAT form, giving to Medical Records to fax forms off to [place] on 04/02/2023.

## 2023-04-06 DIAGNOSIS — G35 Multiple sclerosis: Secondary | ICD-10-CM | POA: Diagnosis not present

## 2023-04-06 DIAGNOSIS — G43909 Migraine, unspecified, not intractable, without status migrainosus: Secondary | ICD-10-CM | POA: Diagnosis not present

## 2023-04-06 DIAGNOSIS — Z556 Problems related to health literacy: Secondary | ICD-10-CM | POA: Diagnosis not present

## 2023-04-06 DIAGNOSIS — R4701 Aphasia: Secondary | ICD-10-CM | POA: Diagnosis not present

## 2023-04-06 DIAGNOSIS — M5481 Occipital neuralgia: Secondary | ICD-10-CM | POA: Diagnosis not present

## 2023-04-06 DIAGNOSIS — I10 Essential (primary) hypertension: Secondary | ICD-10-CM | POA: Diagnosis not present

## 2023-04-14 DIAGNOSIS — R4701 Aphasia: Secondary | ICD-10-CM | POA: Diagnosis not present

## 2023-04-14 DIAGNOSIS — M5481 Occipital neuralgia: Secondary | ICD-10-CM | POA: Diagnosis not present

## 2023-04-14 DIAGNOSIS — I10 Essential (primary) hypertension: Secondary | ICD-10-CM | POA: Diagnosis not present

## 2023-04-14 DIAGNOSIS — G43909 Migraine, unspecified, not intractable, without status migrainosus: Secondary | ICD-10-CM | POA: Diagnosis not present

## 2023-04-14 DIAGNOSIS — G35 Multiple sclerosis: Secondary | ICD-10-CM | POA: Diagnosis not present

## 2023-04-14 DIAGNOSIS — Z556 Problems related to health literacy: Secondary | ICD-10-CM | POA: Diagnosis not present

## 2023-04-20 ENCOUNTER — Telehealth: Payer: Self-pay | Admitting: Neurology

## 2023-04-20 DIAGNOSIS — I10 Essential (primary) hypertension: Secondary | ICD-10-CM | POA: Diagnosis not present

## 2023-04-20 DIAGNOSIS — Z556 Problems related to health literacy: Secondary | ICD-10-CM | POA: Diagnosis not present

## 2023-04-20 DIAGNOSIS — M5481 Occipital neuralgia: Secondary | ICD-10-CM | POA: Diagnosis not present

## 2023-04-20 DIAGNOSIS — G43909 Migraine, unspecified, not intractable, without status migrainosus: Secondary | ICD-10-CM | POA: Diagnosis not present

## 2023-04-20 DIAGNOSIS — G35 Multiple sclerosis: Secondary | ICD-10-CM | POA: Diagnosis not present

## 2023-04-20 DIAGNOSIS — R4701 Aphasia: Secondary | ICD-10-CM | POA: Diagnosis not present

## 2023-04-20 NOTE — Telephone Encounter (Signed)
Pt confirming appt details

## 2023-04-30 DIAGNOSIS — G43909 Migraine, unspecified, not intractable, without status migrainosus: Secondary | ICD-10-CM | POA: Diagnosis not present

## 2023-04-30 DIAGNOSIS — G35 Multiple sclerosis: Secondary | ICD-10-CM | POA: Diagnosis not present

## 2023-04-30 DIAGNOSIS — M5481 Occipital neuralgia: Secondary | ICD-10-CM | POA: Diagnosis not present

## 2023-04-30 DIAGNOSIS — Z556 Problems related to health literacy: Secondary | ICD-10-CM | POA: Diagnosis not present

## 2023-04-30 DIAGNOSIS — I10 Essential (primary) hypertension: Secondary | ICD-10-CM | POA: Diagnosis not present

## 2023-04-30 DIAGNOSIS — R4701 Aphasia: Secondary | ICD-10-CM | POA: Diagnosis not present

## 2023-05-04 DIAGNOSIS — I10 Essential (primary) hypertension: Secondary | ICD-10-CM | POA: Diagnosis not present

## 2023-05-04 DIAGNOSIS — R4701 Aphasia: Secondary | ICD-10-CM | POA: Diagnosis not present

## 2023-05-04 DIAGNOSIS — M5481 Occipital neuralgia: Secondary | ICD-10-CM | POA: Diagnosis not present

## 2023-05-04 DIAGNOSIS — Z556 Problems related to health literacy: Secondary | ICD-10-CM | POA: Diagnosis not present

## 2023-05-04 DIAGNOSIS — G43909 Migraine, unspecified, not intractable, without status migrainosus: Secondary | ICD-10-CM | POA: Diagnosis not present

## 2023-05-04 DIAGNOSIS — G35 Multiple sclerosis: Secondary | ICD-10-CM | POA: Diagnosis not present

## 2023-05-07 DIAGNOSIS — R4701 Aphasia: Secondary | ICD-10-CM | POA: Diagnosis not present

## 2023-05-07 DIAGNOSIS — G35 Multiple sclerosis: Secondary | ICD-10-CM | POA: Diagnosis not present

## 2023-05-07 DIAGNOSIS — G43909 Migraine, unspecified, not intractable, without status migrainosus: Secondary | ICD-10-CM | POA: Diagnosis not present

## 2023-05-07 DIAGNOSIS — I10 Essential (primary) hypertension: Secondary | ICD-10-CM | POA: Diagnosis not present

## 2023-05-07 DIAGNOSIS — M5481 Occipital neuralgia: Secondary | ICD-10-CM | POA: Diagnosis not present

## 2023-05-07 DIAGNOSIS — Z556 Problems related to health literacy: Secondary | ICD-10-CM | POA: Diagnosis not present

## 2023-05-12 DIAGNOSIS — R609 Edema, unspecified: Secondary | ICD-10-CM | POA: Diagnosis not present

## 2023-05-12 DIAGNOSIS — R350 Frequency of micturition: Secondary | ICD-10-CM | POA: Diagnosis not present

## 2023-05-12 DIAGNOSIS — K219 Gastro-esophageal reflux disease without esophagitis: Secondary | ICD-10-CM | POA: Diagnosis not present

## 2023-05-13 DIAGNOSIS — G35 Multiple sclerosis: Secondary | ICD-10-CM | POA: Diagnosis not present

## 2023-05-13 DIAGNOSIS — G43909 Migraine, unspecified, not intractable, without status migrainosus: Secondary | ICD-10-CM | POA: Diagnosis not present

## 2023-05-13 DIAGNOSIS — I10 Essential (primary) hypertension: Secondary | ICD-10-CM | POA: Diagnosis not present

## 2023-05-13 DIAGNOSIS — R4701 Aphasia: Secondary | ICD-10-CM | POA: Diagnosis not present

## 2023-05-13 DIAGNOSIS — Z556 Problems related to health literacy: Secondary | ICD-10-CM | POA: Diagnosis not present

## 2023-05-13 DIAGNOSIS — M5481 Occipital neuralgia: Secondary | ICD-10-CM | POA: Diagnosis not present

## 2023-05-19 ENCOUNTER — Telehealth: Payer: Self-pay | Admitting: Neurology

## 2023-05-19 ENCOUNTER — Telehealth: Payer: Self-pay | Admitting: *Deleted

## 2023-05-19 NOTE — Telephone Encounter (Signed)
 Pt said having a weird cramping in legs Diclofenac Sodium Gel (helps some). Would like a call back discuss what can be done about it.

## 2023-05-19 NOTE — Telephone Encounter (Signed)
 Pt last saw Dr. Epimenio Foot 04/03/22. Next f/u 07/15/23. Cx 01/08/23 appt d/t conflict per notes. She needs to come in for updated visit. I called pt at [phone]. Not sleeping well at night d/t cramps in legs. Uses [facility].  She agreed to schedule sooner appt for 06/01/23 at 2pm with Dr. Epimenio Foot. Asked she check in at 1:30pm, bring updated insurance cards and med list to appt. Cx 07/15/23 appt since she is coming in sooner.

## 2023-05-19 NOTE — Telephone Encounter (Signed)
 error

## 2023-05-21 DIAGNOSIS — R4701 Aphasia: Secondary | ICD-10-CM | POA: Diagnosis not present

## 2023-05-21 DIAGNOSIS — Z556 Problems related to health literacy: Secondary | ICD-10-CM | POA: Diagnosis not present

## 2023-05-21 DIAGNOSIS — I10 Essential (primary) hypertension: Secondary | ICD-10-CM | POA: Diagnosis not present

## 2023-05-21 DIAGNOSIS — G43909 Migraine, unspecified, not intractable, without status migrainosus: Secondary | ICD-10-CM | POA: Diagnosis not present

## 2023-05-21 DIAGNOSIS — G35 Multiple sclerosis: Secondary | ICD-10-CM | POA: Diagnosis not present

## 2023-05-21 DIAGNOSIS — M5481 Occipital neuralgia: Secondary | ICD-10-CM | POA: Diagnosis not present

## 2023-06-01 ENCOUNTER — Ambulatory Visit (INDEPENDENT_AMBULATORY_CARE_PROVIDER_SITE_OTHER): Payer: 59 | Admitting: Neurology

## 2023-06-01 ENCOUNTER — Encounter: Payer: Self-pay | Admitting: Neurology

## 2023-06-01 VITALS — BP 142/90 | HR 47 | Ht 59.0 in | Wt 167.0 lb

## 2023-06-01 DIAGNOSIS — G2581 Restless legs syndrome: Secondary | ICD-10-CM | POA: Diagnosis not present

## 2023-06-01 DIAGNOSIS — G35 Multiple sclerosis: Secondary | ICD-10-CM

## 2023-06-01 DIAGNOSIS — R269 Unspecified abnormalities of gait and mobility: Secondary | ICD-10-CM

## 2023-06-01 DIAGNOSIS — R531 Weakness: Secondary | ICD-10-CM

## 2023-06-01 DIAGNOSIS — R3915 Urgency of urination: Secondary | ICD-10-CM

## 2023-06-01 DIAGNOSIS — M25552 Pain in left hip: Secondary | ICD-10-CM

## 2023-06-01 DIAGNOSIS — R5383 Other fatigue: Secondary | ICD-10-CM | POA: Diagnosis not present

## 2023-06-01 DIAGNOSIS — R413 Other amnesia: Secondary | ICD-10-CM | POA: Diagnosis not present

## 2023-06-01 NOTE — Progress Notes (Signed)
 [facility]  PATIENT: [name] DOB: 09/04/1955  REFERRING DOCTOR OR PCP:  Dr. Marjory Lies  SOURCE: patient, MRI images on PACS, imaging reports, notes in EPIC and labs in EPIC  _________________________________   HISTORICAL  CHIEF COMPLAINT:  Chief Complaint  Patient presents with   Follow-up    Pt in 11 alone Pt here MS f/u Pt states increased leg cramps Pt states frequent urination. Pt states legs are hurting today Pt states affecting her gait Pt states 2 falls in last six months  Pt unaware what medications she is taking     HISTORY OF PRESENT ILLNESS:  [name] is a 68 y.o. woman with relapsing remitting multiple sclerosis diagnosed in 1983  Update  06/01/2023 She is off all DMT since stopping Betaseron in 2021.  She has not had any exacerbations though she does feel her gait is gradually doing worse. She gets a spasm tight sensation in her legs, R>L.     MRI 12/28/2022 showed o new lesions.   She went to the ED 12/26/2022 due to gait issues (only a little worse that day) and she had scans to r/o CVA or exacerbation --- these showed no acute findings.   Gait is poor and she uses her walker mostly and cane now and then.   The right leg is mildly weaker and stiffer than the left.   She feels the dalfampridine has helped her gait.    She does take concurrent tramadol but just 25 mg po qHS and we discussed risk of seizures.     She has some spasticity and takes baclofen.      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   Surgery was discussed with her in past but she opted not to do this.  Solifenacin with Leslye Peer helps her urinary frequency/urgency better than solifenacin alone. Rare stress incontinence.   She notes some swelling in the right ankle.  She has RLS and takes gabapentin and ropinirole with some benefit.    She is also on trazodone due to insomnia.  She wakes up early many mornings.   She has reduced short term memory and  reduced attention and some word finding ererors.  This has also gradually worsened over the years.   She has depression and anxiety and is on combination of sertraline and buspar.    She notes left hip and buttock pain.   She takes one half of the tramadol (25 mg) at night which helps her fall asleep   MS History:    In 1983, while at work, she was having difficulty with her gait and was unsteady and falling. She R doctor and was referred to Dr. Buzzy Han of [facility] who diagnosed her with multiple sclerosis. She was in the Betaseron drug study (was on placebo) with Dr. Carlos American in [place]. When Betaseron became available in 1992 or 1993 she started the medication and was seen Dr. Sandria Manly. Over the next few years she had a couple exacerbations that involved leg function, gait or balance. She had some treatments with IV steroids. She also has had some slow worsening with her gait more recently.   MRI Review:   MRI of the brain and cervical spine from 07/25/2013. In the brain, there are extensive T2/FLAIR hyperintense foci involving the cerebellum, left posterior medulla, there was a normal enhancement pattern. The cervical spine showed hyperintense foci adjacent to C3, C5-C6, C6, and T1. Right posterior pons,  bilateral hemispheres, predominantly in the periventricular cortical white matter.   None of these enhanced.  MRI of the brain March 2020 showed Extensive T2/FLAIR hyperintense foci in the hemispheres and a few foci in the pons and cerebellum in a pattern and configuration consistent with chronic demyelinating plaque associated with multiple sclerosis.  There could also be superimposed foci of chronic microvascular ischemic change.  The MRI is essentially unchanged compared to 2011  MRI of the brain 06/18/2019 shows Extensive T2/FLAIR hyperintense foci in the hemispheres and a few foci in the pons and cerebellum in a pattern and configuration consistent with chronic demyelinating plaque  associated with multiple sclerosis.  There could also be superimposed foci of chronic microvascular ischemic change.  There were no changes compared to the MRI dated 10/28/2016  MRI of the brain 06/30/2020 shows Multiple T2/FLAIR hyperintense foci in the hemispheres, brainstem and cerebellum in a pattern and configuration consistent with chronic demyelinating plaque associated with multiple sclerosis.  None of the foci appear to be acute.  They do not enhance.  Compared to the MRI from 06/18/2019, there do not appear to be any new lesions.  MRI brain 08/31/2021 showed T2/FLAIR hyperintense foci in the cerebral hemispheres, brainstem and cerebellum in a pattern consistent with chronic demyelinating plaque associated with multiple sclerosis. None of the foci appear to be acute. They do not enhance. Compared to the MRI dated 06/30/2020 there were no new lesions.   MRI cervical spine showed T2 hyperintense foci within the spinal cord adjacent to C3, C5-C6 and C6-C7.  These are consistent with stable chronic demyelinating plaque associated with multiple sclerosis.  They do not enhance.  They are also present on the MRI from 07/25/2013.      At C5-C6, there is mild spinal stenosis due to central disc protrusion and other degenerative changes.  There is mild foraminal narrowing but no nerve root compression.  These changes have progressed compared to the 2015 MRI.  At C6-C7, there is a stable left disc osteophyte complex causing moderate severe left foraminal narrowing that could lead to left C7 nerve root compression.  The degenerative changes are stable compared to the 2015 MRI.  MRI brain 12/28/2022 showed no e lesions    REVIEW OF SYSTEMS: Constitutional: No fevers, chills, sweats, or change in appetite.  Fatigue and sleepiness.  She has maintenance insomnia Eyes: No visual changes, double vision, eye pain.   Some fatigue. She has insomnia helped by trazodone. Ear, nose and throat: No hearing loss, ear pain, nasal  congestion, sore throat Cardiovascular: No chest pain, palpitations Respiratory:  No shortness of breath at rest or with exertion.   No wheezes.   She snores GastrointestinaI: No nausea, vomiting, diarrhea, abdominal pain, fecal incontinence Genitourinary:  as above Musculoskeletal:  No neck pain, back pain Integumentary: No rash, pruritus, skin lesions Neurological: as above Psychiatric: No depression at this time.  No anxiety Endocrine: No palpitations, diaphoresis, change in appetite, change in weigh or increased thirst Hematologic/Lymphatic:  No anemia, purpura, petechiae. Allergic/Immunologic: No itchy/runny eyes, nasal congestion, recent allergic reactions, rashes  ALLERGIES: Allergies  Allergen Reactions   Other Other (See Comments)    Pt states she took a medication 2-3 yrs ago that may have been for inflammation and it caused her to be weak.   Myrbetriq [Mirabegron]     Caused her to dry out/nose bleeds    HOME MEDICATIONS:  Current Outpatient Medications:    acetaminophen (TYLENOL) 500 MG tablet, Take 500 mg by mouth  every 6 (six) hours as needed., Disp: , Rfl:    amLODipine (NORVASC) 10 MG tablet, Take 10 mg by mouth daily., Disp: , Rfl:    atorvastatin (LIPITOR) 20 MG tablet, Take 20 mg by mouth daily., Disp: , Rfl:    baclofen (LIORESAL) 10 MG tablet, TAKE ONE TABLET BY MOUTH EVERY MORNING and TAKE ONE TABLET BY MOUTH AT NOON and TAKE ONE TABLET BY MOUTH EVERYDAY AT BEDTIME, Disp: 42 tablet, Rfl: 0   baclofen (LIORESAL) 10 MG tablet, TAKE ONE TABLET BY MOUTH EVERY MORNING and TAKE ONE TABLET BY MOUTH AT NOON and TAKE ONE TABLET BY MOUTH EVERYDAY AT BEDTIME, Disp: 90 each, Rfl: 5   benzonatate (TESSALON) 200 MG capsule, Take 200 mg by mouth 3 (three) times daily as needed., Disp: , Rfl:    busPIRone (BUSPAR) 15 MG tablet, TAKE ONE TABLET BY MOUTH EVERY MORNING and TAKE ONE TABLET BY MOUTH EVERYDAY AT BEDTIME (Patient taking differently: Take 15 mg by mouth 2 (two) times  daily. TAKE ONE TABLET BY MOUTH EVERY MORNING and TAKE ONE TABLET BY MOUTH EVERYDAY AT BEDTIME), Disp: 180 tablet, Rfl: 3   busPIRone (BUSPAR) 15 MG tablet, TAKE ONE TABLET BY MOUTH EVERY MORNING and TAKE ONE TABLET BY MOUTH EVERYDAY AT BEDTIME, Disp: 60 tablet, Rfl: 5   dalfampridine 10 MG TB12, TAKE ONE TABLET BY MOUTH EVERY MORNING and TAKE ONE TABLET BY MOUTH EVERYDAY AT BEDTIME, Disp: 60 tablet, Rfl: 11   dalfampridine 10 MG TB12, TAKE ONE TABLET BY MOUTH EVERY MORNING and TAKE ONE TABLET BY MOUTH EVERYDAY AT BEDTIME, Disp: 60 tablet, Rfl: 5   diclofenac Sodium (VOLTAREN) 1 % GEL, Apply topically., Disp: , Rfl:    esomeprazole (NEXIUM) 40 MG capsule, Take 40 mg by mouth daily., Disp: , Rfl:    estradiol (ESTRACE) 0.1 MG/GM vaginal cream, Place vaginally every evening., Disp: , Rfl:    famotidine (PEPCID) 20 MG tablet, Take 20 mg by mouth at bedtime., Disp: , Rfl:    fluticasone (FLONASE) 50 MCG/ACT nasal spray, Place 2 sprays into the nose as needed. For stuffy nose., Disp: , Rfl:    gabapentin (NEURONTIN) 100 MG capsule, Take 1 capsule (100 mg total) by mouth every morning AND 2 capsules (200 mg total) daily in the afternoon AND 2 capsules (200 mg total) every evening., Disp: 150 capsule, Rfl: 1   gabapentin (NEURONTIN) 100 MG capsule, Take 1 capsule (100 mg total) by mouth every morning AND 2 capsules (200 mg total) daily in the afternoon AND 2 capsules (200 mg total) every evening., Disp: 150 capsule, Rfl: 5   gabapentin (NEURONTIN) 100 MG capsule, 1 capsule (100 mg total) by mouth every morning AND 2 capsules (200 mg total) daily in the afternoon AND 2 capsules (200 mg total) every evening.,, Disp: 20 capsule, Rfl: 0   GEMTESA 75 MG TABS, TAKE ONE TABLET BY MOUTH EVERYDAY AT BEDTIME FOR urgency of URINATION, Disp: 30 tablet, Rfl: 5   hydrocortisone 2.5 % ointment, Apply topically., Disp: , Rfl:    ibuprofen (ADVIL,MOTRIN) 600 MG tablet, Take 1 tablet by mouth every 8 (eight) hours as needed  for mild pain or moderate pain. , Disp: , Rfl:    Lidocaine, Anorectal, 5 % CREA, Apply topically 3 (three) times daily as needed., Disp: , Rfl:    Loperamide HCl (ANTI-DIARRHEAL PO), Take 2 mg by mouth as needed., Disp: , Rfl:    losartan-hydrochlorothiazide (HYZAAR) 100-12.5 MG tablet, Take 1 tablet by mouth daily., Disp: ,  Rfl:    losartan-hydrochlorothiazide (HYZAAR) 100-25 MG tablet, Take 1 tablet by mouth daily., Disp: , Rfl:    meclizine (ANTIVERT) 25 MG tablet, Take 25 mg by mouth as needed for dizziness., Disp: , Rfl:    montelukast (SINGULAIR) 10 MG tablet, Take 10 mg by mouth at bedtime., Disp: , Rfl:    mupirocin ointment (BACTROBAN) 2 %, mupirocin 2 % topical ointment  APPLY A SMALL AMOUNT TO THE AFFECTED AREA BY TOPICAL ROUTE 3 TIMES PER DAY, Disp: , Rfl:    nystatin (MYCOSTATIN/NYSTOP) powder, Apply topically 4 (four) times daily., Disp: , Rfl:    nystatin cream (MYCOSTATIN), Apply 1 application topically 2 (two) times daily., Disp: , Rfl:    ondansetron (ZOFRAN) 4 MG tablet, Take 4-8 mg by mouth every 6 (six) hours as needed for nausea or vomiting., Disp: , Rfl:    potassium chloride (K-DUR) 10 MEQ tablet, Take 10 mEq by mouth daily., Disp: , Rfl:    potassium chloride (KLOR-CON M) 10 MEQ tablet, Take 10 mEq by mouth daily., Disp: , Rfl:    rOPINIRole (REQUIP) 0.5 MG tablet, TAKE ONE TABLET BY MOUTH every morning, ONE TABLET every evening, & TAKE TWO TABLETS everyday AT bedtime, Disp: 120 tablet, Rfl: 11   rOPINIRole (REQUIP) 0.5 MG tablet, TAKE ONE TABLET BY MOUTH every morning, ONE TABLET every evening, & TAKE TWO TABLETS everyday AT bedtime, Disp: 120 tablet, Rfl: 5   sertraline (ZOLOFT) 100 MG tablet, TAKE 1 AND 1/2 TABLETS BY MOUTH ONCE DAILY FOR mood, Disp: 135 tablet, Rfl: 5   sertraline (ZOLOFT) 100 MG tablet, Take 1.5 tablets (150 mg total) by mouth daily., Disp: 45 tablet, Rfl: 5   solifenacin (VESICARE) 10 MG tablet, Take 1 tablet (10 mg total) by mouth daily., Disp: 14  tablet, Rfl: 0   solifenacin (VESICARE) 10 MG tablet, Take 1 tablet (10 mg total) by mouth daily., Disp: 30 tablet, Rfl: 5   solifenacin (VESICARE) 10 MG tablet, Take 1 tablet (10 mg total) by mouth daily., Disp: 5 tablet, Rfl: 0   SUDOGEST 30 MG tablet, Take 30 mg by mouth as needed., Disp: , Rfl:    traMADol (ULTRAM) 50 MG tablet, Take 1 tablet (50 mg total) by mouth daily. (Patient taking differently: Take 50 mg by mouth as needed.), Disp: 30 tablet, Rfl: 0   traZODone (DESYREL) 100 MG tablet, Take 50 mg by mouth at bedtime., Disp: , Rfl:    traZODone (DESYREL) 50 MG tablet, Take 50 mg by mouth at bedtime., Disp: , Rfl:   PAST MEDICAL HISTORY: Past Medical History:  Diagnosis Date   Anemia    HX   Anxiety    Arthritis    Chronic kidney disease    UTI'S   Complication of anesthesia    BP RISES WHEN AWAKENING   Complication of anesthesia    "difficult waking up and can become combative"    GERD (gastroesophageal reflux disease)    H/O hiatal hernia    Headache(784.0)    Heart murmur    Hypertension    Lumbar spinal stenosis    Lumbar spondylolysis    MS (multiple sclerosis) (HCC)    Neuromuscular disorder (HCC)    MS   Urinary frequency    Wears glasses     PAST SURGICAL HISTORY: Past Surgical History:  Procedure Laterality Date   CHOLECYSTECTOMY     EYE SURGERY     BLEPHAROPLASTY   LUMBAR FUSION  05/10/2015   POSTERIOR FUSION LUMBAR SPINE  04/16/11   L4-5   TRANSFORAMINAL LUMBAR INTERBODY FUSION (TLIF) WITH PEDICLE SCREW FIXATION 1 LEVEL N/A 05/10/2015   Procedure: TRANSFORAMINAL LUMBAR INTERBODY FUSION (TLIF) WITH PEDICLE SCREW FIXATION L3-4;  Surgeon: Tia Alert, MD;  Location: [facility];  Service: Neurosurgery;  Laterality: N/A;  TRANSFORAMINAL LUMBAR INTERBODY FUSION (TLIF) WITH PEDICLE SCREW FIXATION L3-4   UTERINE FIBROID EMBOLIZATION      FAMILY HISTORY: Family History  Problem Relation Age of Onset   Lung cancer Mother    Emphysema Father    Heart  disease Other    Multiple sclerosis Neg Hx     SOCIAL HISTORY:  Social History   Socioeconomic History   Marital status: Single    Spouse name: Not on file   Number of children: 0   Years of education: 14   Highest education level: Not on file  Occupational History    Comment: Does not work - Disabled  Tobacco Use   Smoking status: Former    Current packs/day: 0.00    Types: Cigarettes    Quit date: 04/08/1996    Years since quitting: 27.1   Smokeless tobacco: Never   Tobacco comments:    ALCOHOL IN PAST  Vaping Use   Vaping status: Not on file  Substance and Sexual Activity   Alcohol use: No   Drug use: No   Sexual activity: Yes    Birth control/protection: Post-menopausal  Other Topics Concern   Not on file  Social History Narrative   Patient does not work she is disabled. and lives at home. Patient is single.   Caffeine: 1-2 cups daily depending on the day   Right handed.   Social Drivers of Corporate investment banker Strain: Not on file  Food Insecurity: Not on file  Transportation Needs: Not on file  Physical Activity: Not on file  Stress: Not on file  Social Connections: Unknown (03/12/2022)   Received from [facility]   Social Network    Social Network: Not on file  Intimate Partner Violence: Unknown (03/12/2022)   Received from [facility], [facility]   HITS    Physically Hurt: Not on file    Insult or Talk Down To: Not on file    Threaten Physical Harm: Not on file    Scream or Curse: Not on file     PHYSICAL EXAM  Vitals:   06/01/23 1348  BP: (!) 166/75  Pulse: (!) 47  Weight: 167 lb (75.8 kg)  Height: 4\' 11"  (1.499 m)     Body mass index is 33.73 kg/m.    General: The patient is well-developed and well-nourished and in no acute distress.    Skin: Extremities are without rash or edema.  Neurologic Exam  Mental status: The patient is alert and oriented x 3 at the time of the examination. The patient has  apparent normal recent and remote memory, with an apparently normal attention span and concentration ability.   Speech is normal.  Cranial nerves: Extraocular movements are full.  Facial strength and sensation was normal.  Trapezius strength is normal.  No obvious hearing deficits are noted.  Motor:  Muscle bulk is normal.  Muscle tone is increased in the legs.  Strength was 5/5 except 4+/5 left iliopsoas and toe extension  Sensory: Normal touch and vibration sensation in limbs.    Coordination: She has good finger-nose-finger bilaterally.  Heel-to-shin is mildly reduced  Gait and station: Station is normal.  Gait is  mildly wide.  She is able to walk without support in the room but is off balance and needs to hold on when she turns.  She cannot do a tandem walk  She walks better with walker.   Romberg is borderline.   Reflexes: Deep tendon reflexes are symmetric and normal bilaterally.        ASSESSMENT AND PLAN  Multiple sclerosis (HCC)  Gait disturbance  Left hip pain  Weakness  Other fatigue  Urinary urgency  Restless leg  Memory loss   1.   She will remain off of a disease modifying therapy for MS (SPMS).   She has been stable.  We discussed the natural history of MS. 2.   Continue Dalfampridine for MS gait.    She is on a very low dose of tramadol and we discussed risk of seizure may be higher on the combination.  Advised not to increase the dose.   Renal function is normal 3.   Stay active.  Exercise as tolerated 4,   Trazodone Requip and gabapentin for RLS and sleep.   Gemtesa and solifenacin for bladder.    On sertraline and buspar with mood with good control 5.    Due to poor balance and risk of falls, continue to use the rollator style walker.   6.   BP amlodipine, hydrochlorothiazide, losartanShe will return in 12 months or sooner if there are new or worsening neurologic symptoms.   This visit is part of a comprehensive longitudinal care medical relationship  regarding the patients primary diagnosis of MS and related concerns.   Navarre Diana A. Epimenio Foot, MD, PhD 06/01/2023, 2:19 PM Certified in Neurology, Clinical Neurophysiology, Sleep Medicine, Pain Medicine and Neuroimaging  [facility] [address] [place], [place] [address] [phone]

## 2023-06-04 ENCOUNTER — Telehealth: Payer: Self-pay | Admitting: Neurology

## 2023-06-04 DIAGNOSIS — R350 Frequency of micturition: Secondary | ICD-10-CM

## 2023-06-04 DIAGNOSIS — R3915 Urgency of urination: Secondary | ICD-10-CM

## 2023-06-04 MED ORDER — GEMTESA 75 MG PO TABS: ORAL_TABLET | ORAL | 11 refills | Status: AC

## 2023-06-04 NOTE — Telephone Encounter (Addendum)
 Spoke to patient asked  pt  if  she started Ethete and solifenacin for bladder. Dr Epimenio Foot prescribed at her last office visit 06/01/2023 Pt states unaware because she gets her medication in a pill pack.Advised pt to call pharmacy to see they have started to distribute medication yet ? If not please start medication if still having bladder  issues after two weeks please give [place] a call back . Pt expressed understanding and thanked me for calling

## 2023-06-04 NOTE — Telephone Encounter (Signed)
 Pt has called to report that she called [facility], they do not have a Rx for  GEMTESA 75 MG TABS for her

## 2023-06-04 NOTE — Telephone Encounter (Signed)
 Pt called stating that she is still having urination issues. She states that she still can not control urinating herself even when she visits a friend or goes to the movies. Pt would like to discuss.

## 2023-06-04 NOTE — Addendum Note (Signed)
 Addended by: Raynald Kemp A on: 06/04/2023 04:27 PM   Modules accepted: Orders

## 2023-06-04 NOTE — Telephone Encounter (Signed)
 Spoke to patient sent refill request to [facility] pharmacy today

## 2023-06-08 NOTE — Telephone Encounter (Signed)
 Took call from Lisa/phone room and spoke w/ Megan/[facility]. They did not receive Gemtesa rx. I relayed it was e-scribed 06/04/23 at 4:26pm. She checked again. Pt has two profiles and they located in other profile. Confirmed she should be taking both Leslye Peer and Vesicare per MD notes. She verbalized understanding and appreciation.

## 2023-06-12 ENCOUNTER — Other Ambulatory Visit: Payer: Self-pay | Admitting: Family Medicine

## 2023-06-12 DIAGNOSIS — N644 Mastodynia: Secondary | ICD-10-CM

## 2023-06-22 ENCOUNTER — Other Ambulatory Visit: Payer: Self-pay | Admitting: *Deleted

## 2023-06-22 MED ORDER — BACLOFEN 10 MG PO TABS: ORAL_TABLET | ORAL | 5 refills | Status: DC

## 2023-06-22 MED ORDER — SOLIFENACIN SUCCINATE 10 MG PO TABS: 10.0000 mg | ORAL_TABLET | Freq: Every day | ORAL | 5 refills | Status: DC

## 2023-06-22 MED ORDER — GABAPENTIN 100 MG PO CAPS: ORAL_CAPSULE | ORAL | 5 refills | Status: DC

## 2023-06-22 MED ORDER — BUSPIRONE HCL 15 MG PO TABS: ORAL_TABLET | ORAL | 3 refills | Status: DC

## 2023-06-22 MED ORDER — ROPINIROLE HCL 0.5 MG PO TABS: ORAL_TABLET | ORAL | 11 refills | Status: AC

## 2023-06-22 MED ORDER — DALFAMPRIDINE ER 10 MG PO TB12: ORAL_TABLET | ORAL | 11 refills | Status: AC

## 2023-06-26 ENCOUNTER — Ambulatory Visit
Admission: EM | Admit: 2023-06-26 | Discharge: 2023-06-26 | Disposition: A | Discharge status: Home / Self Care | Attending: Family Medicine | Admitting: Family Medicine

## 2023-06-26 DIAGNOSIS — L089 Local infection of the skin and subcutaneous tissue, unspecified: Secondary | ICD-10-CM

## 2023-06-26 DIAGNOSIS — T148XXA Other injury of unspecified body region, initial encounter: Secondary | ICD-10-CM

## 2023-06-26 MED ORDER — MUPIROCIN 2 % EX OINT: 1.0000 | TOPICAL_OINTMENT | Freq: Two times a day (BID) | CUTANEOUS | 0 refills | Status: DC

## 2023-06-26 MED ORDER — CEPHALEXIN 250 MG PO CAPS: 250.0000 mg | ORAL_CAPSULE | Freq: Two times a day (BID) | ORAL | 0 refills | Status: AC

## 2023-06-26 NOTE — ED Triage Notes (Signed)
 Pt present with a bruise on the rt forearm. States she was walking into the bathroom, slipped and grabbed something to hold onto, she missed and fell on top of her walker. Pt states she has been using neosporin, has kept it covered. Pt is concerned about drainage.

## 2023-06-26 NOTE — ED Provider Notes (Addendum)
 [facility]    CSN: [id number] Arrival date & time: 06/26/23  0857      History   Chief Complaint Chief Complaint  Patient presents with   Fall    HPI [name] is a 68 y.o. female.    Fall  Here for a wound on her right arm.  2 days ago she was walking into her bathroom and slipped and grabbed for something hold onto and then fell on top of her walker.  She did not hit her head or any other part of her body at that time.  She bumped her right forearm on her walker and caused a skin tear there.  Since this occurred it is now starting to look yellow in the base of the wound and has enlarged.  There is some bruising around it but it also is a little bit more tender and warm.  No fever or chills  She is allergic to Myrbetriq  Past medical history significant for MS and lumbar radiculopathy.  Last tetanus was about 2 years ago  Past Medical History:  Diagnosis Date   Anemia    HX   Anxiety    Arthritis    Chronic kidney disease    UTI'S   Complication of anesthesia    BP RISES WHEN AWAKENING   Complication of anesthesia    "difficult waking up and can become combative"    GERD (gastroesophageal reflux disease)    H/O hiatal hernia    Headache(784.0)    Heart murmur    Hypertension    Lumbar spinal stenosis    Lumbar spondylolysis    MS (multiple sclerosis) (HCC)    Neuromuscular disorder (HCC)    MS   Urinary frequency    Wears glasses     Patient Active Problem List   Diagnosis Date Noted   Left hip pain 06/20/2020   Left sided sciatica 06/20/2020   Depression with anxiety 06/20/2019   Dry eyes 09/30/2017   Urinary urgency 09/30/2016   Neck pain 01/28/2016   Bilateral occipital neuralgia 01/28/2016   Gait disturbance 11/23/2015   Other fatigue 11/23/2015   Memory loss 11/23/2015   Snoring 11/23/2015   Excessive daytime sleepiness 11/23/2015   S/P lumbar spinal fusion 05/10/2015   S/P lumbar fusion 05/10/2015   Migraine without aura  07/07/2013   Multiple sclerosis (HCC) 12/30/2012    Past Surgical History:  Procedure Laterality Date   CHOLECYSTECTOMY     EYE SURGERY     BLEPHAROPLASTY   LUMBAR FUSION  05/10/2015   POSTERIOR FUSION LUMBAR SPINE  04/16/11   L4-5   TRANSFORAMINAL LUMBAR INTERBODY FUSION (TLIF) WITH PEDICLE SCREW FIXATION 1 LEVEL N/A 05/10/2015   Procedure: TRANSFORAMINAL LUMBAR INTERBODY FUSION (TLIF) WITH PEDICLE SCREW FIXATION L3-4;  Surgeon: Tia Alert, MD;  Location: [facility];  Service: Neurosurgery;  Laterality: N/A;  TRANSFORAMINAL LUMBAR INTERBODY FUSION (TLIF) WITH PEDICLE SCREW FIXATION L3-4   UTERINE FIBROID EMBOLIZATION      OB History   No obstetric history on file.      Home Medications    Prior to Admission medications   Medication Sig Start Date End Date Taking? Authorizing Provider  cephALEXin (KEFLEX) 250 MG capsule Take 1 capsule (250 mg total) by mouth 2 (two) times daily for 7 days. 06/26/23 07/03/23 Yes Charron Coultas, Janace Aris, MD  mupirocin ointment (BACTROBAN) 2 % Apply 1 Application topically 2 (two) times daily. To affected area till better 06/26/23  Yes Nil Bolser,  Janace Aris, MD  acetaminophen (TYLENOL) 500 MG tablet Take 500 mg by mouth every 6 (six) hours as needed.    [provider]  amLODipine (NORVASC) 10 MG tablet Take 10 mg by mouth daily.    [provider]  atorvastatin (LIPITOR) 20 MG tablet Take 20 mg by mouth daily.    [provider]  baclofen (LIORESAL) 10 MG tablet TAKE ONE TABLET BY MOUTH EVERY MORNING and TAKE ONE TABLET BY MOUTH AT NOON and TAKE ONE TABLET BY MOUTH EVERYDAY AT BEDTIME 01/08/23   Sater, Pearletha Furl, MD  baclofen (LIORESAL) 10 MG tablet TAKE ONE TABLET BY MOUTH EVERY MORNING and TAKE ONE TABLET BY MOUTH AT NOON and TAKE ONE TABLET BY MOUTH EVERYDAY AT BEDTIME 06/22/23   Sater, Pearletha Furl, MD  benzonatate (TESSALON) 200 MG capsule Take 200 mg by mouth 3 (three) times daily as needed. 04/10/20   [provider]   busPIRone (BUSPAR) 15 MG tablet TAKE ONE TABLET BY MOUTH EVERY MORNING and TAKE ONE TABLET BY MOUTH EVERYDAY AT BEDTIME 01/08/23   Sater, Pearletha Furl, MD  busPIRone (BUSPAR) 15 MG tablet TAKE ONE TABLET BY MOUTH EVERY MORNING and TAKE ONE TABLET BY MOUTH EVERYDAY AT BEDTIME 06/22/23   Sater, Pearletha Furl, MD  dalfampridine 10 MG TB12 TAKE ONE TABLET BY MOUTH EVERY MORNING and TAKE ONE TABLET BY MOUTH EVERYDAY AT BEDTIME 01/08/23   Sater, Pearletha Furl, MD  dalfampridine 10 MG TB12 TAKE ONE TABLET BY MOUTH EVERY MORNING and TAKE ONE TABLET BY MOUTH EVERYDAY AT BEDTIME 06/22/23   Sater, Pearletha Furl, MD  diclofenac Sodium (VOLTAREN) 1 % GEL Apply topically.    [provider]  esomeprazole (NEXIUM) 40 MG capsule Take 40 mg by mouth daily.    [provider]  estradiol (ESTRACE) 0.1 MG/GM vaginal cream Place vaginally every evening. 01/02/21   [provider]  famotidine (PEPCID) 20 MG tablet Take 20 mg by mouth at bedtime.    [provider]  fluticasone (FLONASE) 50 MCG/ACT nasal spray Place 2 sprays into the nose as needed. For stuffy nose.    [provider]  gabapentin (NEURONTIN) 100 MG capsule Take 1 capsule (100 mg total) by mouth every morning AND 2 capsules (200 mg total) daily in the afternoon AND 2 capsules (200 mg total) every evening. 12/24/22   Sater, Pearletha Furl, MD  gabapentin (NEURONTIN) 100 MG capsule 1 capsule (100 mg total) by mouth every morning AND 2 capsules (200 mg total) daily in the afternoon AND 2 capsules (200 mg total) every evening., 01/28/23   Sater, Pearletha Furl, MD  gabapentin (NEURONTIN) 100 MG capsule Take 1 capsule (100 mg total) by mouth every morning AND 2 capsules (200 mg total) daily in the afternoon AND 2 capsules (200 mg total) every evening. 06/22/23   Sater, Pearletha Furl, MD  hydrocortisone 2.5 % ointment Apply topically.    [provider]  ibuprofen (ADVIL,MOTRIN) 600 MG tablet Take 1 tablet by mouth every 8 (eight) hours as  needed for mild pain or moderate pain.  06/23/13   [provider]  Lidocaine, Anorectal, 5 % CREA Apply topically 3 (three) times daily as needed.    [provider]  Loperamide HCl (ANTI-DIARRHEAL PO) Take 2 mg by mouth as needed.    [provider]  losartan-hydrochlorothiazide (HYZAAR) 100-12.5 MG tablet Take 1 tablet by mouth daily.    [provider]  losartan-hydrochlorothiazide (HYZAAR) 100-25 MG tablet Take 1 tablet by mouth  daily.    [provider]  meclizine (ANTIVERT) 25 MG tablet Take 25 mg by mouth as needed for dizziness.    [provider]  montelukast (SINGULAIR) 10 MG tablet Take 10 mg by mouth at bedtime.    [provider]  nystatin (MYCOSTATIN/NYSTOP) powder Apply topically 4 (four) times daily.    [provider]  nystatin cream (MYCOSTATIN) Apply 1 application topically 2 (two) times daily. 02/09/20   [provider]  ondansetron (ZOFRAN) 4 MG tablet Take 4-8 mg by mouth every 6 (six) hours as needed for nausea or vomiting.    [provider]  potassium chloride (K-DUR) 10 MEQ tablet Take 10 mEq by mouth daily.    [provider]  potassium chloride (KLOR-CON M) 10 MEQ tablet Take 10 mEq by mouth daily. 12/25/22   [provider]  rOPINIRole (REQUIP) 0.5 MG tablet TAKE ONE TABLET BY MOUTH every morning, ONE TABLET every evening, & TAKE TWO TABLETS everyday AT bedtime 01/08/23   Sater, Pearletha Furl, MD  rOPINIRole (REQUIP) 0.5 MG tablet TAKE ONE TABLET BY MOUTH every morning, ONE TABLET every evening, & TAKE TWO TABLETS everyday AT bedtime 06/22/23   Sater, Pearletha Furl, MD  sertraline (ZOLOFT) 100 MG tablet TAKE 1 AND 1/2 TABLETS BY MOUTH ONCE DAILY FOR mood 07/31/22   Sater, Pearletha Furl, MD  sertraline (ZOLOFT) 100 MG tablet Take 1.5 tablets (150 mg total) by mouth daily. 01/08/23   Sater, Pearletha Furl, MD  solifenacin (VESICARE) 10 MG tablet Take 1 tablet (10 mg total) by mouth daily.  01/08/23   Sater, Pearletha Furl, MD  solifenacin (VESICARE) 10 MG tablet Take 1 tablet (10 mg total) by mouth daily. 01/28/23   Sater, Pearletha Furl, MD  solifenacin (VESICARE) 10 MG tablet Take 1 tablet (10 mg total) by mouth daily. 06/22/23   Sater, Pearletha Furl, MD  SUDOGEST 30 MG tablet Take 30 mg by mouth as needed. 04/13/20   [provider]  traMADol (ULTRAM) 50 MG tablet Take 1 tablet (50 mg total) by mouth daily. Patient taking differently: Take 50 mg by mouth as needed. 01/11/20   Sater, Pearletha Furl, MD  traZODone (DESYREL) 100 MG tablet Take 50 mg by mouth at bedtime.    [provider]  traZODone (DESYREL) 50 MG tablet Take 50 mg by mouth at bedtime. 12/24/22   [provider]  Vibegron (GEMTESA) 75 MG TABS TAKE ONE TABLET BY MOUTH EVERYDAY AT BEDTIME FOR urgency of URINATION 06/04/23   Sater, Pearletha Furl, MD    Family History Family History  Problem Relation Age of Onset   Lung cancer Mother    Emphysema Father    Heart disease Other    Multiple sclerosis Neg Hx     Social History Social History   Tobacco Use   Smoking status: Former    Current packs/day: 0.00    Types: Cigarettes    Quit date: 04/08/1996    Years since quitting: 27.2   Smokeless tobacco: Never   Tobacco comments:    ALCOHOL IN PAST  Substance Use Topics   Alcohol use: No   Drug use: No     Allergies   Other and Myrbetriq [mirabegron]   Review of Systems Review of Systems   Physical Exam Triage Vital Signs ED Triage Vitals  Encounter Vitals Group     BP 06/26/23 0913 (!) 160/88     Systolic BP Percentile --      Diastolic BP Percentile --  Pulse Rate 06/26/23 0913 60     Resp 06/26/23 0913 15     Temp --      Temp src --      SpO2 06/26/23 0913 95 %     Weight --      Height --      Head Circumference --      Peak Flow --      Pain Score 06/26/23 0912 3     Pain Loc --      Pain Education --      Exclude from Growth Chart --    No data found.  Updated Vital  Signs BP (!) 160/88 (BP Location: Left Arm)   Pulse 60   Resp 15   SpO2 95%   Visual Acuity Right Eye Distance:   Left Eye Distance:   Bilateral Distance:    Right Eye Near:   Left Eye Near:    Bilateral Near:     Physical Exam Vitals reviewed.  Constitutional:      General: She is not in acute distress.    Appearance: She is not ill-appearing, toxic-appearing or diaphoretic.  Skin:    Coloration: Skin is not pale.     Comments: On the volar surface of her right forearm there is an elliptical shaped wound that is about 3 cm in length and 1 cm wide at its midpoint.  There is some yellow material at the base of the wound.  Surrounding that wound there is an area of ecchymosis and possible mild erythema and tenderness.  That is approximately 6 cm in diameter.  Neurological:     Mental Status: She is alert and oriented to person, place, and time.  Psychiatric:        Behavior: Behavior normal.      UC Treatments / Results  Labs (all labs ordered are listed, but only abnormal results are displayed) Labs Reviewed - No data to display  EKG   Radiology No results found.  Procedures Procedures (including critical care time)  Medications Ordered in UC Medications - No data to display  Initial Impression / Assessment and Plan / UC Course  I have reviewed the triage vital signs and the nursing notes.  Pertinent labs & imaging results that were available during my care of the patient were reviewed by me and considered in my medical decision making (see chart for details).     Bactroban is sent in for topical antibiotic treatment and Keflex is sent into the pharmacy for possible wound infection.  Staff will dress the wound here with bacitracin.  Wound care as explained Final Clinical Impressions(s) / UC Diagnoses   Final diagnoses:  Post-traumatic wound infection     Discharge Instructions      Put mupirocin ointment on the sore areas twice daily until  improved  Take cephalexin 250 mg--1 capsule 2 times daily for 7 days.  Wash the wound 2 times daily with soapy water, then put ointment and a new dressing on.  Once that is starting to heal and look better, you can start doing this just once daily.      ED Prescriptions     Medication Sig Dispense Auth. Provider   mupirocin ointment (BACTROBAN) 2 % Apply 1 Application topically 2 (two) times daily. To affected area till better 22 g Zenia Resides, MD   cephALEXin (KEFLEX) 250 MG capsule Take 1 capsule (250 mg total) by mouth 2 (two) times daily for 7 days. 14 capsule  Zenia Resides, MD      I have reviewed the PDMP during this encounter.   Zenia Resides, MD 06/26/23 4132    Zenia Resides, MD 06/26/23 [phone]

## 2023-06-26 NOTE — Discharge Instructions (Addendum)
 Put mupirocin ointment on the sore areas twice daily until improved  Take cephalexin 250 mg--1 capsule 2 times daily for 7 days.  Wash the wound 2 times daily with soapy water, then put ointment and a new dressing on.  Once that is starting to heal and look better, you can start doing this just once daily.

## 2023-07-08 ENCOUNTER — Encounter

## 2023-07-08 ENCOUNTER — Other Ambulatory Visit

## 2023-07-09 DIAGNOSIS — I10 Essential (primary) hypertension: Secondary | ICD-10-CM | POA: Diagnosis not present

## 2023-07-09 DIAGNOSIS — R053 Chronic cough: Secondary | ICD-10-CM | POA: Diagnosis not present

## 2023-07-14 ENCOUNTER — Telehealth: Payer: Self-pay | Admitting: Neurology

## 2023-07-14 NOTE — Telephone Encounter (Signed)
 Pt called stating that she is having a lot of pain in both legs and Buttock's and feels that she is progressing in a fast pace with her MS. Pt would like to discuss.

## 2023-07-14 NOTE — Telephone Encounter (Signed)
 Pt last seen 06/01/23 and next f/u 05/31/24. Called pt at [phone]. Sx have worsened since last appt. More she is up standing/walking, sx worsen. Last few nights, unable to go to sleep d/t pain.  Taking medications as prescribed.  Updated med list on file, pharmacy and allergy list. She is taking voltaren gel prn (arthritis pain) which helps some.   Wanting to know what else Dr. Godwin Lat would recommend at this point.  Total time of call: 23 min.

## 2023-07-15 ENCOUNTER — Ambulatory Visit: Payer: 59 | Admitting: Neurology

## 2023-07-15 ENCOUNTER — Other Ambulatory Visit: Payer: Self-pay | Admitting: Neurology

## 2023-07-15 DIAGNOSIS — Z981 Arthrodesis status: Secondary | ICD-10-CM

## 2023-07-15 DIAGNOSIS — M5431 Sciatica, right side: Secondary | ICD-10-CM

## 2023-07-15 DIAGNOSIS — M5432 Sciatica, left side: Secondary | ICD-10-CM

## 2023-07-15 MED ORDER — METHYLPREDNISOLONE 4 MG PO TABS: ORAL_TABLET | ORAL | 0 refills | Status: AC

## 2023-07-15 NOTE — Telephone Encounter (Signed)
 Called pt. Relayed Dr. Thom Fleeting recommendations. She is agreeable to try steroid pack and I went over directions/had her write down and read back. Read back correctly. She will keep gabapentin  at current dose. Wants to see if steroid helps first. Aware to be on look out for call to schedule MRI once approved by insurance.

## 2023-07-16 ENCOUNTER — Telehealth: Payer: Self-pay | Admitting: Neurology

## 2023-07-16 NOTE — Telephone Encounter (Signed)
 no auth required sent to [facility] [phone]

## 2023-07-17 DIAGNOSIS — R053 Chronic cough: Secondary | ICD-10-CM | POA: Diagnosis not present

## 2023-07-21 ENCOUNTER — Telehealth: Payer: Self-pay | Admitting: Neurology

## 2023-07-21 NOTE — Telephone Encounter (Signed)
 Pt called stating that she took the last methylPREDNISolone  (MEDROL ) 4 MG tablet this morning and she is wanting to know if she needs to f/u or what should she do. Please advise.

## 2023-07-21 NOTE — Telephone Encounter (Signed)
 Called patient to discuss the below. Pt reports she feel better than she did before taking medrol . Pt has MRI lumbar scheduled on 08/24/23. She is taking gabapentin  daily as directed. Pt said she noticed the pain more when walking. She enjoys walking outside but states the pain is too bad to do that, I advised her maybe walking outside should be put on hold right now until we can figure out what's doing on with her back. Pt reports history of back surgery and she believes the pain is related to her back as well. Pt said as of now pain is okay and she feel better, no follow up needed as now other than MRI.

## 2023-07-24 DIAGNOSIS — N3281 Overactive bladder: Secondary | ICD-10-CM | POA: Diagnosis not present

## 2023-07-24 DIAGNOSIS — K5790 Diverticulosis of intestine, part unspecified, without perforation or abscess without bleeding: Secondary | ICD-10-CM | POA: Diagnosis not present

## 2023-07-24 DIAGNOSIS — I350 Nonrheumatic aortic (valve) stenosis: Secondary | ICD-10-CM | POA: Diagnosis not present

## 2023-07-24 DIAGNOSIS — Z556 Problems related to health literacy: Secondary | ICD-10-CM | POA: Diagnosis not present

## 2023-07-24 DIAGNOSIS — M797 Fibromyalgia: Secondary | ICD-10-CM | POA: Diagnosis not present

## 2023-07-24 DIAGNOSIS — K219 Gastro-esophageal reflux disease without esophagitis: Secondary | ICD-10-CM | POA: Diagnosis not present

## 2023-07-24 DIAGNOSIS — Z5982 Transportation insecurity: Secondary | ICD-10-CM | POA: Diagnosis not present

## 2023-07-24 DIAGNOSIS — Z604 Social exclusion and rejection: Secondary | ICD-10-CM | POA: Diagnosis not present

## 2023-07-24 DIAGNOSIS — G4700 Insomnia, unspecified: Secondary | ICD-10-CM | POA: Diagnosis not present

## 2023-07-24 DIAGNOSIS — K5900 Constipation, unspecified: Secondary | ICD-10-CM | POA: Diagnosis not present

## 2023-07-24 DIAGNOSIS — M4800 Spinal stenosis, site unspecified: Secondary | ICD-10-CM | POA: Diagnosis not present

## 2023-07-24 DIAGNOSIS — G35 Multiple sclerosis: Secondary | ICD-10-CM | POA: Diagnosis not present

## 2023-07-24 DIAGNOSIS — M1990 Unspecified osteoarthritis, unspecified site: Secondary | ICD-10-CM | POA: Diagnosis not present

## 2023-07-24 DIAGNOSIS — I10 Essential (primary) hypertension: Secondary | ICD-10-CM | POA: Diagnosis not present

## 2023-07-24 DIAGNOSIS — Z87891 Personal history of nicotine dependence: Secondary | ICD-10-CM | POA: Diagnosis not present

## 2023-07-27 ENCOUNTER — Telehealth: Payer: Self-pay | Admitting: Anesthesiology

## 2023-07-27 NOTE — Telephone Encounter (Signed)
 Pt called stating her PCP Dr Faustina Hood recently diagnosed her with Bronchiectasis and she is being refereed to a Pulmonologist. Stated she wanted to make Dr Godwin Lat aware of this. Stated if Dr Godwin Lat has any questions for her feel free to call her, otherwise just fyi.

## 2023-07-28 ENCOUNTER — Telehealth: Payer: Self-pay | Admitting: Neurology

## 2023-07-28 DIAGNOSIS — M4800 Spinal stenosis, site unspecified: Secondary | ICD-10-CM | POA: Diagnosis not present

## 2023-07-28 DIAGNOSIS — K219 Gastro-esophageal reflux disease without esophagitis: Secondary | ICD-10-CM | POA: Diagnosis not present

## 2023-07-28 DIAGNOSIS — Z87891 Personal history of nicotine dependence: Secondary | ICD-10-CM | POA: Diagnosis not present

## 2023-07-28 DIAGNOSIS — Z556 Problems related to health literacy: Secondary | ICD-10-CM | POA: Diagnosis not present

## 2023-07-28 DIAGNOSIS — I350 Nonrheumatic aortic (valve) stenosis: Secondary | ICD-10-CM | POA: Diagnosis not present

## 2023-07-28 DIAGNOSIS — K5900 Constipation, unspecified: Secondary | ICD-10-CM | POA: Diagnosis not present

## 2023-07-28 DIAGNOSIS — N3281 Overactive bladder: Secondary | ICD-10-CM | POA: Diagnosis not present

## 2023-07-28 DIAGNOSIS — G4700 Insomnia, unspecified: Secondary | ICD-10-CM | POA: Diagnosis not present

## 2023-07-28 DIAGNOSIS — G35 Multiple sclerosis: Secondary | ICD-10-CM | POA: Diagnosis not present

## 2023-07-28 DIAGNOSIS — M797 Fibromyalgia: Secondary | ICD-10-CM | POA: Diagnosis not present

## 2023-07-28 DIAGNOSIS — I10 Essential (primary) hypertension: Secondary | ICD-10-CM | POA: Diagnosis not present

## 2023-07-28 DIAGNOSIS — M1990 Unspecified osteoarthritis, unspecified site: Secondary | ICD-10-CM | POA: Diagnosis not present

## 2023-07-28 DIAGNOSIS — K5790 Diverticulosis of intestine, part unspecified, without perforation or abscess without bleeding: Secondary | ICD-10-CM | POA: Diagnosis not present

## 2023-07-28 DIAGNOSIS — Z5982 Transportation insecurity: Secondary | ICD-10-CM | POA: Diagnosis not present

## 2023-07-28 DIAGNOSIS — Z604 Social exclusion and rejection: Secondary | ICD-10-CM | POA: Diagnosis not present

## 2023-07-28 MED ORDER — GABAPENTIN 100 MG PO CAPS: 300.0000 mg | ORAL_CAPSULE | Freq: Three times a day (TID) | ORAL | 2 refills | Status: DC

## 2023-07-28 NOTE — Telephone Encounter (Signed)
 Called pt. She is scheduled for MRI Lumbar for 08/24/23. She will call [facility] back to see if she can move this up to complete sooner.   Steroid pack only helped for 1.5 days. Experiencing intermittent pain in buttocks. Very weak. Hard for her to get up after laying down.  Open to increasing gabapentin  100mg  as previously recommended. She currently takes 100mg  at 8am, 200mg  at 2pm and 200mg  at 8pm. Dr. Godwin Lat recommended increasing to a total of 9 pills per day. I placed on hold to speak with MD to see how he wants her to take throughout the day. He was not available. Pt aware I will speak with MD about this and call back with directions.

## 2023-07-28 NOTE — Telephone Encounter (Signed)
 Pt said after finish taking methylPREDNISolone  (MEDROL ) 4 MG tablet Two days later started hurting from buttock down to ankle since. Would like a call from the nurse.

## 2023-07-28 NOTE — Telephone Encounter (Signed)
 Spoke w/ Dr. Godwin Lat. Pt can take gabapentin  100mg , 300mg  po TID.  I called pt and relayed recommendation. Pt aware new rx called into pharmacy for her.

## 2023-07-29 ENCOUNTER — Encounter (HOSPITAL_COMMUNITY): Payer: Self-pay

## 2023-07-29 NOTE — Telephone Encounter (Addendum)
 Patient called stating that the Gabapentin  was not sent to [facility] by [employer]. Pt said she spoke with [place] and they have fixed it for her, medication due to arrived on 08/02/23 or 05/09/23.   Pt gave me the # to [facility] [phone] and I added the [facility] pharmacy to chart and deleted [facility]. Pt gets her medication pre-packaged.   Pt was thankful for the call back.

## 2023-07-29 NOTE — Telephone Encounter (Signed)
 Pt called stating that there was a problem when her Gabapentin  was called in. Pt would like to discuss with RN. Pt was not clear on what the problem was. Pt kept saying "There was a huge mix up with [employer] and [facility] and well anyway's there is nothing they can do about it" Please advise.

## 2023-07-30 DIAGNOSIS — Z5982 Transportation insecurity: Secondary | ICD-10-CM | POA: Diagnosis not present

## 2023-07-30 DIAGNOSIS — Z556 Problems related to health literacy: Secondary | ICD-10-CM | POA: Diagnosis not present

## 2023-07-30 DIAGNOSIS — I350 Nonrheumatic aortic (valve) stenosis: Secondary | ICD-10-CM | POA: Diagnosis not present

## 2023-07-30 DIAGNOSIS — I10 Essential (primary) hypertension: Secondary | ICD-10-CM | POA: Diagnosis not present

## 2023-07-30 DIAGNOSIS — K5900 Constipation, unspecified: Secondary | ICD-10-CM | POA: Diagnosis not present

## 2023-07-30 DIAGNOSIS — G35 Multiple sclerosis: Secondary | ICD-10-CM | POA: Diagnosis not present

## 2023-07-30 DIAGNOSIS — M797 Fibromyalgia: Secondary | ICD-10-CM | POA: Diagnosis not present

## 2023-07-30 DIAGNOSIS — Z604 Social exclusion and rejection: Secondary | ICD-10-CM | POA: Diagnosis not present

## 2023-07-30 DIAGNOSIS — K5790 Diverticulosis of intestine, part unspecified, without perforation or abscess without bleeding: Secondary | ICD-10-CM | POA: Diagnosis not present

## 2023-07-30 DIAGNOSIS — M1990 Unspecified osteoarthritis, unspecified site: Secondary | ICD-10-CM | POA: Diagnosis not present

## 2023-07-30 DIAGNOSIS — K219 Gastro-esophageal reflux disease without esophagitis: Secondary | ICD-10-CM | POA: Diagnosis not present

## 2023-07-30 DIAGNOSIS — N3281 Overactive bladder: Secondary | ICD-10-CM | POA: Diagnosis not present

## 2023-07-30 DIAGNOSIS — Z87891 Personal history of nicotine dependence: Secondary | ICD-10-CM | POA: Diagnosis not present

## 2023-07-30 DIAGNOSIS — G4700 Insomnia, unspecified: Secondary | ICD-10-CM | POA: Diagnosis not present

## 2023-07-30 DIAGNOSIS — M4800 Spinal stenosis, site unspecified: Secondary | ICD-10-CM | POA: Diagnosis not present

## 2023-08-03 ENCOUNTER — Other Ambulatory Visit: Payer: 59

## 2023-08-03 ENCOUNTER — Other Ambulatory Visit: Payer: Self-pay | Admitting: Family Medicine

## 2023-08-03 DIAGNOSIS — E2839 Other primary ovarian failure: Secondary | ICD-10-CM

## 2023-08-04 ENCOUNTER — Ambulatory Visit: Payer: Self-pay

## 2023-08-04 NOTE — Telephone Encounter (Signed)
 Chief Complaint: Intermittent productive coughing   Symptoms: Face gets warm, intermittent coughing with clear phlegm x2 weeks; intermittent SOB "not very much," pain in mid back (has had two back surgeries), chest pain after cough ("discomfort") Pertinent Negatives: Patient denies fever  Disposition:  [x] Appointment (In office)  Additional Notes: Patient was diagnosed with bronchiectasis a couple of weeks ago by Dr. Faustina Hood. Pt is trying to get established with a pulmonologist. Pt does not have a way to check her oxygen saturation at home. Pt scheduled for first available appointment at [facility] location as pt states she needs to stay in [place] location. This RN educated pt on new-worsening symptoms and when to call back/seek emergent care. Pt verbalized understanding and agrees to plan.    Reason for Disposition  [1] MILD longstanding difficulty breathing AND [2]  SAME as normal  Answer Assessment - Initial Assessment Questions RESPIRATORY STATUS: "Describe your breathing?" (e.g., wheezing, shortness of breath, unable to speak, severe coughing)      Occasional SOB, intermittent cough ONSET: "When did this breathing problem begin?"      Two weeks ago PATTERN "Does the difficult breathing come and go, or has it been constant since it started?"      Comes and goes SEVERITY: "How bad is your breathing?" (e.g., mild, moderate, severe)    - MILD: No SOB at rest, mild SOB with walking, speaks normally in sentences, can lie down, no retractions, pulse < 100.    - MODERATE: SOB at rest, SOB with minimal exertion and prefers to sit, cannot lie down flat, speaks in phrases, mild retractions, audible wheezing, pulse 100-120.    - SEVERE: Very SOB at rest, speaks in single words, struggling to breathe, sitting hunched forward, retractions, pulse > 120      Mild to moderate O2 SATURATION MONITOR:  "Do you use an oxygen saturation monitor (pulse oximeter) at home?" If Yes, ask: "What is your  reading (oxygen level) today?" "What is your usual oxygen saturation reading?" (e.g., 95%)       Not able to check at home  Protocols used: Breathing Difficulty-A-AH

## 2023-08-04 NOTE — Telephone Encounter (Signed)
 FYI

## 2023-08-04 NOTE — Telephone Encounter (Addendum)
 Pt disconnected before [facility] was able to successfully tx to [facility].  Copied from CRM [id number]. Topic: Clinical - Red Word Triage >> Aug 04, 2023  9:28 AM Tyronne Galloway wrote: Red Word that prompted transfer to Nurse Triage: New patient (no referral on file, coming from Dr. Faustina Hood) no appt made. Pt mentioned she was diagnosed with bronchitis and sometimes has coughing spells coupled with shortness of breath and difficulty breathing. Pt stated it isn't constant, just random at times.

## 2023-08-04 NOTE — Telephone Encounter (Signed)
 Copied from CRM [id number]. Topic: Clinical - Red Word Triage >> Aug 04, 2023  9:28 AM Tyronne Galloway wrote: Red Word that prompted transfer to Nurse Triage: New patient (no referral on file, coming from Dr. Faustina Hood) no appt made. Pt mentioned she was diagnosed with bronchitis and sometimes has coughing spells coupled with shortness of breath and difficulty breathing. Pt stated it isn't constant, just random at times.This encounter was created in error - please disregard.

## 2023-08-05 ENCOUNTER — Ambulatory Visit
Admission: RE | Admit: 2023-08-05 | Discharge: 2023-08-05 | Disposition: A | Discharge status: Home / Self Care | Source: Ambulatory Visit | Attending: Family Medicine | Admitting: Family Medicine

## 2023-08-05 ENCOUNTER — Ambulatory Visit
Admission: RE | Admit: 2023-08-05 | Discharge: 2023-08-05 | Disposition: A | Discharge status: Home / Self Care | Source: Ambulatory Visit | Attending: Family Medicine

## 2023-08-05 DIAGNOSIS — N6002 Solitary cyst of left breast: Secondary | ICD-10-CM | POA: Diagnosis not present

## 2023-08-05 DIAGNOSIS — N644 Mastodynia: Secondary | ICD-10-CM | POA: Diagnosis not present

## 2023-08-06 DIAGNOSIS — I10 Essential (primary) hypertension: Secondary | ICD-10-CM | POA: Diagnosis not present

## 2023-08-06 DIAGNOSIS — M4800 Spinal stenosis, site unspecified: Secondary | ICD-10-CM | POA: Diagnosis not present

## 2023-08-06 DIAGNOSIS — Z556 Problems related to health literacy: Secondary | ICD-10-CM | POA: Diagnosis not present

## 2023-08-06 DIAGNOSIS — I350 Nonrheumatic aortic (valve) stenosis: Secondary | ICD-10-CM | POA: Diagnosis not present

## 2023-08-06 DIAGNOSIS — Z604 Social exclusion and rejection: Secondary | ICD-10-CM | POA: Diagnosis not present

## 2023-08-06 DIAGNOSIS — M797 Fibromyalgia: Secondary | ICD-10-CM | POA: Diagnosis not present

## 2023-08-06 DIAGNOSIS — K219 Gastro-esophageal reflux disease without esophagitis: Secondary | ICD-10-CM | POA: Diagnosis not present

## 2023-08-06 DIAGNOSIS — M1990 Unspecified osteoarthritis, unspecified site: Secondary | ICD-10-CM | POA: Diagnosis not present

## 2023-08-06 DIAGNOSIS — K5900 Constipation, unspecified: Secondary | ICD-10-CM | POA: Diagnosis not present

## 2023-08-06 DIAGNOSIS — G35 Multiple sclerosis: Secondary | ICD-10-CM | POA: Diagnosis not present

## 2023-08-06 DIAGNOSIS — N3281 Overactive bladder: Secondary | ICD-10-CM | POA: Diagnosis not present

## 2023-08-06 DIAGNOSIS — Z87891 Personal history of nicotine dependence: Secondary | ICD-10-CM | POA: Diagnosis not present

## 2023-08-06 DIAGNOSIS — K5790 Diverticulosis of intestine, part unspecified, without perforation or abscess without bleeding: Secondary | ICD-10-CM | POA: Diagnosis not present

## 2023-08-06 DIAGNOSIS — G4700 Insomnia, unspecified: Secondary | ICD-10-CM | POA: Diagnosis not present

## 2023-08-06 DIAGNOSIS — Z5982 Transportation insecurity: Secondary | ICD-10-CM | POA: Diagnosis not present

## 2023-08-11 ENCOUNTER — Ambulatory Visit (INDEPENDENT_AMBULATORY_CARE_PROVIDER_SITE_OTHER)

## 2023-08-11 ENCOUNTER — Ambulatory Visit: Admitting: Pulmonary Disease

## 2023-08-11 ENCOUNTER — Encounter: Payer: Self-pay | Admitting: Pulmonary Disease

## 2023-08-11 VITALS — BP 147/83 | HR 86 | Ht 62.5 in | Wt 172.0 lb

## 2023-08-11 DIAGNOSIS — G4710 Hypersomnia, unspecified: Secondary | ICD-10-CM | POA: Diagnosis not present

## 2023-08-11 DIAGNOSIS — R0683 Snoring: Secondary | ICD-10-CM

## 2023-08-11 DIAGNOSIS — R059 Cough, unspecified: Secondary | ICD-10-CM | POA: Diagnosis not present

## 2023-08-11 DIAGNOSIS — R052 Subacute cough: Secondary | ICD-10-CM | POA: Diagnosis not present

## 2023-08-11 DIAGNOSIS — R918 Other nonspecific abnormal finding of lung field: Secondary | ICD-10-CM | POA: Diagnosis not present

## 2023-08-11 DIAGNOSIS — Z87891 Personal history of nicotine dependence: Secondary | ICD-10-CM

## 2023-08-11 DIAGNOSIS — J479 Bronchiectasis, uncomplicated: Secondary | ICD-10-CM

## 2023-08-11 NOTE — Patient Instructions (Addendum)
 I will see you in about 3 months  Schedule for pulmonary function test  Continue graded activities as tolerated  Measures to promote good sleep including enough hours of sleep at night, sleeping with the head of the bed propped up a little bit may help  Obtain a chest x-ray today

## 2023-08-11 NOTE — Progress Notes (Signed)
 [name]    [id number]    [birth year]  Primary Care Physician:Amin, Saad, MD  Referring Physician: Tita Form, MD [address] [place],  [place] [address]  Chief complaint:    Subacute cough lasted over 3 weeks HPI:  Subacute cough lasting over 3 weeks  Sometimes worse at night, sitting up makes it better Sometimes worse after eating  Does have a history of reflux  History of multiple sclerosis diagnosed in her 8s Progressive muscle weakness  Does participate in physical therapy  She does have a history of snoring, what feels rested in the morning usually goes to bed between 8 and 8:30 PM, wakes up between 3 and 4 AM On multiple medications that may be contributing to daytime sleepiness  Has had back surgery in the past Does use a walker for ambulation  Significant smoking history in the past, quit over 20 years ago, at the worst smoked up to 3 packs a day Never diagnosed with any lung disease  She does have history of dysphagia  Outpatient Encounter Medications as of 08/11/2023  Medication Sig   acetaminophen  (TYLENOL ) 500 MG tablet Take 500 mg by mouth every 6 (six) hours as needed.   amLODipine  (NORVASC ) 10 MG tablet Take 10 mg by mouth daily.   atorvastatin  (LIPITOR) 20 MG tablet Take 20 mg by mouth daily.   baclofen  (LIORESAL ) 10 MG tablet TAKE ONE TABLET BY MOUTH EVERY MORNING and TAKE ONE TABLET BY MOUTH AT NOON and TAKE ONE TABLET BY MOUTH EVERYDAY AT BEDTIME   busPIRone  (BUSPAR ) 15 MG tablet TAKE ONE TABLET BY MOUTH EVERY MORNING and TAKE ONE TABLET BY MOUTH EVERYDAY AT BEDTIME   dalfampridine  10 MG TB12 TAKE ONE TABLET BY MOUTH EVERY MORNING and TAKE ONE TABLET BY MOUTH EVERYDAY AT BEDTIME   diclofenac Sodium (VOLTAREN) 1 % GEL Apply topically.   esomeprazole (NEXIUM) 40 MG capsule Take 40 mg by mouth daily.   estradiol (ESTRACE) 0.1 MG/GM vaginal cream Place vaginally every evening.   famotidine  (PEPCID ) 20 MG tablet Take 20 mg by mouth at  bedtime.   fluticasone  (FLONASE ) 50 MCG/ACT nasal spray Place 2 sprays into the nose as needed. For stuffy nose.   gabapentin  (NEURONTIN ) 100 MG capsule Take 3 capsules (300 mg total) by mouth 3 (three) times daily.   hydrocortisone 2.5 % ointment Apply topically.   ibuprofen  (ADVIL ,MOTRIN ) 600 MG tablet Take 1 tablet by mouth every 8 (eight) hours as needed for mild pain or moderate pain.    Lidocaine , Anorectal, 5 % CREA Apply topically 3 (three) times daily as needed.   Loperamide HCl (ANTI-DIARRHEAL PO) Take 2 mg by mouth as needed.   methylPREDNISolone  (MEDROL ) 4 MG tablet Taper from 6 pills po for one day to 1 pill po the last day over 6 days   montelukast  (SINGULAIR ) 10 MG tablet Take 10 mg by mouth at bedtime.   nystatin  (MYCOSTATIN /NYSTOP ) powder Apply topically 4 (four) times daily.   ondansetron  (ZOFRAN ) 4 MG tablet Take 4-8 mg by mouth every 6 (six) hours as needed for nausea or vomiting.   potassium chloride  (KLOR-CON  M) 10 MEQ tablet Take 10 mEq by mouth daily.   rOPINIRole  (REQUIP ) 0.5 MG tablet TAKE ONE TABLET BY MOUTH every morning, ONE TABLET every evening, & TAKE TWO TABLETS everyday AT bedtime   sertraline  (ZOLOFT ) 100 MG tablet Take 1.5 tablets (150 mg total) by mouth daily.   solifenacin  (VESICARE ) 10  MG tablet Take 1 tablet (10 mg total) by mouth daily.   traMADol  (ULTRAM ) 50 MG tablet Take 1 tablet (50 mg total) by mouth daily. (Patient taking differently: Take 50 mg by mouth as needed.)   traZODone  (DESYREL ) 50 MG tablet Take 50 mg by mouth at bedtime.   Vibegron  (GEMTESA ) 75 MG TABS TAKE ONE TABLET BY MOUTH EVERYDAY AT BEDTIME FOR urgency of URINATION   losartan -hydrochlorothiazide  (HYZAAR) 100-12.5 MG tablet Take 1 tablet by mouth daily. (Patient not taking: Reported on 08/11/2023)   SUDOGEST 30 MG tablet Take 30 mg by mouth as needed. (Patient not taking: Reported on 08/11/2023)   No facility-administered encounter medications on file as of 08/11/2023.    Allergies as  of 08/11/2023 - Review Complete 08/11/2023  Allergen Reaction Noted   Other Other (See Comments) 04/01/2011   Myrbetriq  [mirabegron ]  06/16/2019    Past Medical History:  Diagnosis Date   Anemia    HX   Anxiety    Arthritis    Chronic kidney disease    UTI'S   Complication of anesthesia    BP RISES WHEN AWAKENING   Complication of anesthesia    "difficult waking up and can become combative"    GERD (gastroesophageal reflux disease)    H/O hiatal hernia    Headache(784.0)    Heart murmur    Hypertension    Lumbar spinal stenosis    Lumbar spondylolysis    MS (multiple sclerosis) (HCC)    Neuromuscular disorder (HCC)    MS   Urinary frequency    Wears glasses     Past Surgical History:  Procedure Laterality Date   CHOLECYSTECTOMY     EYE SURGERY     BLEPHAROPLASTY   LUMBAR FUSION  05/10/2015   POSTERIOR FUSION LUMBAR SPINE  04/16/11   L4-5   TRANSFORAMINAL LUMBAR INTERBODY FUSION (TLIF) WITH PEDICLE SCREW FIXATION 1 LEVEL N/A 05/10/2015   Procedure: TRANSFORAMINAL LUMBAR INTERBODY FUSION (TLIF) WITH PEDICLE SCREW FIXATION L3-4;  Surgeon: Isadora Mar, MD;  Location: [facility];  Service: Neurosurgery;  Laterality: N/A;  TRANSFORAMINAL LUMBAR INTERBODY FUSION (TLIF) WITH PEDICLE SCREW FIXATION L3-4   UTERINE FIBROID EMBOLIZATION      Family History  Problem Relation Age of Onset   Lung cancer Mother    Emphysema Father    Heart disease Other    Multiple sclerosis Neg Hx     Social History   Socioeconomic History   Marital status: Single    Spouse name: Not on file   Number of children: 0   Years of education: 14   Highest education level: Not on file  Occupational History    Comment: Does not work - Disabled  Tobacco Use   Smoking status: Former    Current packs/day: 0.00    Types: Cigarettes    Quit date: 04/08/1996    Years since quitting: 27.3   Smokeless tobacco: Never   Tobacco comments:    ALCOHOL IN PAST  Vaping Use   Vaping status: Not on  file  Substance and Sexual Activity   Alcohol use: No   Drug use: No   Sexual activity: Yes    Birth control/protection: Post-menopausal  Other Topics Concern   Not on file  Social History Narrative   Patient does not work she is disabled. and lives at home. Patient is single.   Caffeine : 1-2 cups daily depending on the day   Right handed.   Social Drivers of Health  Financial Resource Strain: Not on file  Food Insecurity: Not on file  Transportation Needs: Not on file  Physical Activity: Not on file  Stress: Not on file  Social Connections: Unknown (03/12/2022)   Received from [facility]   Social Network    Social Network: Not on file  Intimate Partner Violence: Unknown (03/12/2022)   Received from [facility]   HITS    Physically Hurt: Not on file    Insult or Talk Down To: Not on file    Threaten Physical Harm: Not on file    Scream or Curse: Not on file    Review of Systems  Respiratory:  Positive for cough.     Vitals:   08/11/23 0848  BP: (!) 147/83  Pulse: 86  SpO2: 96%     Physical Exam Constitutional:      Appearance: She is obese.  HENT:     Head: Normocephalic.     Nose: Nose normal.     Mouth/Throat:     Mouth: Mucous membranes are moist.  Eyes:     General: No scleral icterus. Cardiovascular:     Rate and Rhythm: Normal rate and regular rhythm.     Heart sounds: No murmur heard.    No friction rub.  Pulmonary:     Effort: No respiratory distress.     Breath sounds: No stridor. No wheezing or rhonchi.  Musculoskeletal:     Cervical back: No rigidity or tenderness.  Neurological:     Mental Status: She is alert.  Psychiatric:        Mood and Affect: Mood normal.    Data Reviewed: Chest x-ray report from physicians office does describe bronchiectasis Film not available to be reviewed - Looked on care everywhere and radiology reports did not show the x-ray   Chest x-ray performed today 08/11/2023  reviewed showing prominent is that she show markings  Assessment:  Subacute cough -Cough may have been for a longer period of time  History of multiple sclerosis - Progressive muscle dysfunction  History of dysphagia/reflux  History of snoring, daytime sleepiness - Feels daytime sleepiness may be related to multiple medications that may cause sleepiness  Chronic back pain with history of spinal fusion  Past history of smoking quit over 20 years ago  Plan/Recommendations:  Will schedule the patient for high-resolution CT scan of the chest for evaluation of bronchiectasis  Schedule for pulmonary function test -Being able to obtain inspiratory and expiratory pressures will be helpful but patient does not want to have PFT performed in the hospital  Snoring, daytime sleepiness - Did discuss possibility of sleep disordered breathing - Patient encouraged to look for more information regarding sleep disordered breathing - May consider sleep testing if there is significant concern  Follow-up in about 3 months  Will ensure she gets a high-resolution CT scan of the chest and PFT  Encouraged to stay active, graded activities as tolerated   Myer Artis MD [facility] 08/11/2023, 8:59 AM  CC: Tita Form, MD

## 2023-08-12 ENCOUNTER — Other Ambulatory Visit: Payer: Self-pay

## 2023-08-12 ENCOUNTER — Telehealth: Payer: Self-pay | Admitting: Neurology

## 2023-08-12 ENCOUNTER — Telehealth: Payer: Self-pay

## 2023-08-12 DIAGNOSIS — I350 Nonrheumatic aortic (valve) stenosis: Secondary | ICD-10-CM | POA: Diagnosis not present

## 2023-08-12 DIAGNOSIS — G35 Multiple sclerosis: Secondary | ICD-10-CM | POA: Diagnosis not present

## 2023-08-12 DIAGNOSIS — I10 Essential (primary) hypertension: Secondary | ICD-10-CM | POA: Diagnosis not present

## 2023-08-12 DIAGNOSIS — Z5982 Transportation insecurity: Secondary | ICD-10-CM | POA: Diagnosis not present

## 2023-08-12 DIAGNOSIS — N3281 Overactive bladder: Secondary | ICD-10-CM | POA: Diagnosis not present

## 2023-08-12 DIAGNOSIS — K5790 Diverticulosis of intestine, part unspecified, without perforation or abscess without bleeding: Secondary | ICD-10-CM | POA: Diagnosis not present

## 2023-08-12 DIAGNOSIS — M1990 Unspecified osteoarthritis, unspecified site: Secondary | ICD-10-CM | POA: Diagnosis not present

## 2023-08-12 DIAGNOSIS — K5900 Constipation, unspecified: Secondary | ICD-10-CM | POA: Diagnosis not present

## 2023-08-12 DIAGNOSIS — G4700 Insomnia, unspecified: Secondary | ICD-10-CM | POA: Diagnosis not present

## 2023-08-12 DIAGNOSIS — J479 Bronchiectasis, uncomplicated: Secondary | ICD-10-CM

## 2023-08-12 DIAGNOSIS — Z604 Social exclusion and rejection: Secondary | ICD-10-CM | POA: Diagnosis not present

## 2023-08-12 DIAGNOSIS — M4800 Spinal stenosis, site unspecified: Secondary | ICD-10-CM | POA: Diagnosis not present

## 2023-08-12 DIAGNOSIS — Z556 Problems related to health literacy: Secondary | ICD-10-CM | POA: Diagnosis not present

## 2023-08-12 DIAGNOSIS — M797 Fibromyalgia: Secondary | ICD-10-CM | POA: Diagnosis not present

## 2023-08-12 DIAGNOSIS — R052 Subacute cough: Secondary | ICD-10-CM

## 2023-08-12 DIAGNOSIS — Z87891 Personal history of nicotine dependence: Secondary | ICD-10-CM | POA: Diagnosis not present

## 2023-08-12 DIAGNOSIS — K219 Gastro-esophageal reflux disease without esophagitis: Secondary | ICD-10-CM | POA: Diagnosis not present

## 2023-08-12 MED ORDER — ALPRAZOLAM 0.5 MG PO TABS
ORAL_TABLET | ORAL | 0 refills | Status: AC
Start: 2023-08-12 — End: ?

## 2023-08-12 NOTE — Telephone Encounter (Signed)
 Copied from CRM [id number]. Topic: General - Other >> Aug 12, 2023 10:01 AM Ambrose Junk wrote: Reason for CRM: Patient called, saw Dr Gaynell Keeler on yesterday and he talked about further testing.  Patient would like a call a to discuss what that test was and when.   Called patient and informed her of further testing put in the order for the pulmonary function test and let her know she will get calls to get this scheduled and forwarding to front desk to schedule the pft prior to August appointment with Dr.Olalere and forwarding to Dr.Olalere to review CXR to get results to patient.

## 2023-08-12 NOTE — Telephone Encounter (Signed)
 Pt reports that she has a LP coming up on 06-02 she is asking if Dr Godwin Lat can call something in for her claustrophobia, pt using [facility] [phone]

## 2023-08-12 NOTE — Telephone Encounter (Signed)
 Called pt. Relayed Dr. Albertina Hugger call in xanax  and went over directions. Pt verbalized understanding. This is for upcoming MRI, no LP

## 2023-08-12 NOTE — Telephone Encounter (Signed)
 Dr. Albertina Hugger- You are WID this am. Dr. Godwin Lat out. Are you ok with calling in Xanax  0.5mg  tablet for her? Dr. Godwin Lat has in the past for her MRI's. If agreeable, prescription ready for you to e-scribe in the encounter down below. Thank you!

## 2023-08-13 DIAGNOSIS — Z556 Problems related to health literacy: Secondary | ICD-10-CM | POA: Diagnosis not present

## 2023-08-13 DIAGNOSIS — Z604 Social exclusion and rejection: Secondary | ICD-10-CM | POA: Diagnosis not present

## 2023-08-13 DIAGNOSIS — K5790 Diverticulosis of intestine, part unspecified, without perforation or abscess without bleeding: Secondary | ICD-10-CM | POA: Diagnosis not present

## 2023-08-13 DIAGNOSIS — I350 Nonrheumatic aortic (valve) stenosis: Secondary | ICD-10-CM | POA: Diagnosis not present

## 2023-08-13 DIAGNOSIS — Z87891 Personal history of nicotine dependence: Secondary | ICD-10-CM | POA: Diagnosis not present

## 2023-08-13 DIAGNOSIS — M797 Fibromyalgia: Secondary | ICD-10-CM | POA: Diagnosis not present

## 2023-08-13 DIAGNOSIS — G35 Multiple sclerosis: Secondary | ICD-10-CM | POA: Diagnosis not present

## 2023-08-13 DIAGNOSIS — N3281 Overactive bladder: Secondary | ICD-10-CM | POA: Diagnosis not present

## 2023-08-13 DIAGNOSIS — M4800 Spinal stenosis, site unspecified: Secondary | ICD-10-CM | POA: Diagnosis not present

## 2023-08-13 DIAGNOSIS — K219 Gastro-esophageal reflux disease without esophagitis: Secondary | ICD-10-CM | POA: Diagnosis not present

## 2023-08-13 DIAGNOSIS — M1990 Unspecified osteoarthritis, unspecified site: Secondary | ICD-10-CM | POA: Diagnosis not present

## 2023-08-13 DIAGNOSIS — Z5982 Transportation insecurity: Secondary | ICD-10-CM | POA: Diagnosis not present

## 2023-08-13 DIAGNOSIS — I10 Essential (primary) hypertension: Secondary | ICD-10-CM | POA: Diagnosis not present

## 2023-08-13 DIAGNOSIS — G4700 Insomnia, unspecified: Secondary | ICD-10-CM | POA: Diagnosis not present

## 2023-08-13 DIAGNOSIS — K5900 Constipation, unspecified: Secondary | ICD-10-CM | POA: Diagnosis not present

## 2023-08-14 DIAGNOSIS — K5900 Constipation, unspecified: Secondary | ICD-10-CM | POA: Diagnosis not present

## 2023-08-14 DIAGNOSIS — M797 Fibromyalgia: Secondary | ICD-10-CM | POA: Diagnosis not present

## 2023-08-14 DIAGNOSIS — I350 Nonrheumatic aortic (valve) stenosis: Secondary | ICD-10-CM | POA: Diagnosis not present

## 2023-08-14 DIAGNOSIS — M1990 Unspecified osteoarthritis, unspecified site: Secondary | ICD-10-CM | POA: Diagnosis not present

## 2023-08-14 DIAGNOSIS — Z87891 Personal history of nicotine dependence: Secondary | ICD-10-CM | POA: Diagnosis not present

## 2023-08-14 DIAGNOSIS — Z5982 Transportation insecurity: Secondary | ICD-10-CM | POA: Diagnosis not present

## 2023-08-14 DIAGNOSIS — G4700 Insomnia, unspecified: Secondary | ICD-10-CM | POA: Diagnosis not present

## 2023-08-14 DIAGNOSIS — G35 Multiple sclerosis: Secondary | ICD-10-CM | POA: Diagnosis not present

## 2023-08-14 DIAGNOSIS — Z604 Social exclusion and rejection: Secondary | ICD-10-CM | POA: Diagnosis not present

## 2023-08-14 DIAGNOSIS — K219 Gastro-esophageal reflux disease without esophagitis: Secondary | ICD-10-CM | POA: Diagnosis not present

## 2023-08-14 DIAGNOSIS — N3281 Overactive bladder: Secondary | ICD-10-CM | POA: Diagnosis not present

## 2023-08-14 DIAGNOSIS — M4800 Spinal stenosis, site unspecified: Secondary | ICD-10-CM | POA: Diagnosis not present

## 2023-08-14 DIAGNOSIS — K5790 Diverticulosis of intestine, part unspecified, without perforation or abscess without bleeding: Secondary | ICD-10-CM | POA: Diagnosis not present

## 2023-08-14 DIAGNOSIS — Z556 Problems related to health literacy: Secondary | ICD-10-CM | POA: Diagnosis not present

## 2023-08-14 DIAGNOSIS — I10 Essential (primary) hypertension: Secondary | ICD-10-CM | POA: Diagnosis not present

## 2023-08-14 NOTE — Telephone Encounter (Signed)
 Called patient to get scheduled for a PFT. Patient was out at the moment and will call back to get scheduled.

## 2023-08-18 DIAGNOSIS — K5790 Diverticulosis of intestine, part unspecified, without perforation or abscess without bleeding: Secondary | ICD-10-CM | POA: Diagnosis not present

## 2023-08-18 DIAGNOSIS — Z5982 Transportation insecurity: Secondary | ICD-10-CM | POA: Diagnosis not present

## 2023-08-18 DIAGNOSIS — I350 Nonrheumatic aortic (valve) stenosis: Secondary | ICD-10-CM | POA: Diagnosis not present

## 2023-08-18 DIAGNOSIS — G35 Multiple sclerosis: Secondary | ICD-10-CM | POA: Diagnosis not present

## 2023-08-18 DIAGNOSIS — M4800 Spinal stenosis, site unspecified: Secondary | ICD-10-CM | POA: Diagnosis not present

## 2023-08-18 DIAGNOSIS — K219 Gastro-esophageal reflux disease without esophagitis: Secondary | ICD-10-CM | POA: Diagnosis not present

## 2023-08-18 DIAGNOSIS — M797 Fibromyalgia: Secondary | ICD-10-CM | POA: Diagnosis not present

## 2023-08-18 DIAGNOSIS — Z87891 Personal history of nicotine dependence: Secondary | ICD-10-CM | POA: Diagnosis not present

## 2023-08-18 DIAGNOSIS — M1990 Unspecified osteoarthritis, unspecified site: Secondary | ICD-10-CM | POA: Diagnosis not present

## 2023-08-18 DIAGNOSIS — Z604 Social exclusion and rejection: Secondary | ICD-10-CM | POA: Diagnosis not present

## 2023-08-18 DIAGNOSIS — N3281 Overactive bladder: Secondary | ICD-10-CM | POA: Diagnosis not present

## 2023-08-18 DIAGNOSIS — G4700 Insomnia, unspecified: Secondary | ICD-10-CM | POA: Diagnosis not present

## 2023-08-18 DIAGNOSIS — K5900 Constipation, unspecified: Secondary | ICD-10-CM | POA: Diagnosis not present

## 2023-08-18 DIAGNOSIS — I10 Essential (primary) hypertension: Secondary | ICD-10-CM | POA: Diagnosis not present

## 2023-08-18 DIAGNOSIS — Z556 Problems related to health literacy: Secondary | ICD-10-CM | POA: Diagnosis not present

## 2023-08-19 ENCOUNTER — Encounter: Payer: Self-pay | Admitting: Pulmonary Disease

## 2023-08-19 NOTE — Telephone Encounter (Signed)
 PFT and OV with Dr Gaynell Keeler are scheduled for 11/10/23.

## 2023-08-19 NOTE — Telephone Encounter (Signed)
 Patient needs to be scheduled for pft

## 2023-08-20 DIAGNOSIS — K219 Gastro-esophageal reflux disease without esophagitis: Secondary | ICD-10-CM | POA: Diagnosis not present

## 2023-08-20 DIAGNOSIS — G35 Multiple sclerosis: Secondary | ICD-10-CM | POA: Diagnosis not present

## 2023-08-20 DIAGNOSIS — N3281 Overactive bladder: Secondary | ICD-10-CM | POA: Diagnosis not present

## 2023-08-20 DIAGNOSIS — Z87891 Personal history of nicotine dependence: Secondary | ICD-10-CM | POA: Diagnosis not present

## 2023-08-20 DIAGNOSIS — K5790 Diverticulosis of intestine, part unspecified, without perforation or abscess without bleeding: Secondary | ICD-10-CM | POA: Diagnosis not present

## 2023-08-20 DIAGNOSIS — Z604 Social exclusion and rejection: Secondary | ICD-10-CM | POA: Diagnosis not present

## 2023-08-20 DIAGNOSIS — Z556 Problems related to health literacy: Secondary | ICD-10-CM | POA: Diagnosis not present

## 2023-08-20 DIAGNOSIS — K5900 Constipation, unspecified: Secondary | ICD-10-CM | POA: Diagnosis not present

## 2023-08-20 DIAGNOSIS — I10 Essential (primary) hypertension: Secondary | ICD-10-CM | POA: Diagnosis not present

## 2023-08-20 DIAGNOSIS — Z5982 Transportation insecurity: Secondary | ICD-10-CM | POA: Diagnosis not present

## 2023-08-20 DIAGNOSIS — G4700 Insomnia, unspecified: Secondary | ICD-10-CM | POA: Diagnosis not present

## 2023-08-20 DIAGNOSIS — I350 Nonrheumatic aortic (valve) stenosis: Secondary | ICD-10-CM | POA: Diagnosis not present

## 2023-08-20 DIAGNOSIS — M4800 Spinal stenosis, site unspecified: Secondary | ICD-10-CM | POA: Diagnosis not present

## 2023-08-20 DIAGNOSIS — M1990 Unspecified osteoarthritis, unspecified site: Secondary | ICD-10-CM | POA: Diagnosis not present

## 2023-08-20 DIAGNOSIS — M797 Fibromyalgia: Secondary | ICD-10-CM | POA: Diagnosis not present

## 2023-08-24 ENCOUNTER — Other Ambulatory Visit

## 2023-08-24 ENCOUNTER — Telehealth: Payer: Self-pay | Admitting: Neurology

## 2023-08-24 NOTE — Telephone Encounter (Signed)
 MRI lumbar now scheduled for 09/23/23

## 2023-08-24 NOTE — Telephone Encounter (Signed)
 Pt called and Lvm that she had to reschedule her MRI at [facility] due to feeling sick but she has r/s and wanted the provider to know.

## 2023-08-25 DIAGNOSIS — I10 Essential (primary) hypertension: Secondary | ICD-10-CM | POA: Diagnosis not present

## 2023-08-25 DIAGNOSIS — Z87891 Personal history of nicotine dependence: Secondary | ICD-10-CM | POA: Diagnosis not present

## 2023-08-25 DIAGNOSIS — M1990 Unspecified osteoarthritis, unspecified site: Secondary | ICD-10-CM | POA: Diagnosis not present

## 2023-08-25 DIAGNOSIS — N3281 Overactive bladder: Secondary | ICD-10-CM | POA: Diagnosis not present

## 2023-08-25 DIAGNOSIS — K5790 Diverticulosis of intestine, part unspecified, without perforation or abscess without bleeding: Secondary | ICD-10-CM | POA: Diagnosis not present

## 2023-08-25 DIAGNOSIS — M4800 Spinal stenosis, site unspecified: Secondary | ICD-10-CM | POA: Diagnosis not present

## 2023-08-25 DIAGNOSIS — G4700 Insomnia, unspecified: Secondary | ICD-10-CM | POA: Diagnosis not present

## 2023-08-25 DIAGNOSIS — Z556 Problems related to health literacy: Secondary | ICD-10-CM | POA: Diagnosis not present

## 2023-08-25 DIAGNOSIS — Z5982 Transportation insecurity: Secondary | ICD-10-CM | POA: Diagnosis not present

## 2023-08-25 DIAGNOSIS — I350 Nonrheumatic aortic (valve) stenosis: Secondary | ICD-10-CM | POA: Diagnosis not present

## 2023-08-25 DIAGNOSIS — K219 Gastro-esophageal reflux disease without esophagitis: Secondary | ICD-10-CM | POA: Diagnosis not present

## 2023-08-25 DIAGNOSIS — G35 Multiple sclerosis: Secondary | ICD-10-CM | POA: Diagnosis not present

## 2023-08-25 DIAGNOSIS — Z604 Social exclusion and rejection: Secondary | ICD-10-CM | POA: Diagnosis not present

## 2023-08-25 DIAGNOSIS — M797 Fibromyalgia: Secondary | ICD-10-CM | POA: Diagnosis not present

## 2023-08-25 DIAGNOSIS — K5900 Constipation, unspecified: Secondary | ICD-10-CM | POA: Diagnosis not present

## 2023-08-27 DIAGNOSIS — M1990 Unspecified osteoarthritis, unspecified site: Secondary | ICD-10-CM | POA: Diagnosis not present

## 2023-08-27 DIAGNOSIS — K5790 Diverticulosis of intestine, part unspecified, without perforation or abscess without bleeding: Secondary | ICD-10-CM | POA: Diagnosis not present

## 2023-08-27 DIAGNOSIS — I10 Essential (primary) hypertension: Secondary | ICD-10-CM | POA: Diagnosis not present

## 2023-08-27 DIAGNOSIS — K219 Gastro-esophageal reflux disease without esophagitis: Secondary | ICD-10-CM | POA: Diagnosis not present

## 2023-08-27 DIAGNOSIS — M4800 Spinal stenosis, site unspecified: Secondary | ICD-10-CM | POA: Diagnosis not present

## 2023-08-27 DIAGNOSIS — Z556 Problems related to health literacy: Secondary | ICD-10-CM | POA: Diagnosis not present

## 2023-08-27 DIAGNOSIS — N3281 Overactive bladder: Secondary | ICD-10-CM | POA: Diagnosis not present

## 2023-08-27 DIAGNOSIS — G35 Multiple sclerosis: Secondary | ICD-10-CM | POA: Diagnosis not present

## 2023-08-27 DIAGNOSIS — K5900 Constipation, unspecified: Secondary | ICD-10-CM | POA: Diagnosis not present

## 2023-08-27 DIAGNOSIS — Z87891 Personal history of nicotine dependence: Secondary | ICD-10-CM | POA: Diagnosis not present

## 2023-08-27 DIAGNOSIS — Z5982 Transportation insecurity: Secondary | ICD-10-CM | POA: Diagnosis not present

## 2023-08-27 DIAGNOSIS — I350 Nonrheumatic aortic (valve) stenosis: Secondary | ICD-10-CM | POA: Diagnosis not present

## 2023-08-27 DIAGNOSIS — Z604 Social exclusion and rejection: Secondary | ICD-10-CM | POA: Diagnosis not present

## 2023-08-27 DIAGNOSIS — G4700 Insomnia, unspecified: Secondary | ICD-10-CM | POA: Diagnosis not present

## 2023-08-27 DIAGNOSIS — M797 Fibromyalgia: Secondary | ICD-10-CM | POA: Diagnosis not present

## 2023-09-01 ENCOUNTER — Ambulatory Visit
Admission: RE | Admit: 2023-09-01 | Discharge: 2023-09-01 | Disposition: A | Discharge status: Home / Self Care | Source: Ambulatory Visit | Attending: Pulmonary Disease

## 2023-09-01 DIAGNOSIS — I700 Atherosclerosis of aorta: Secondary | ICD-10-CM | POA: Diagnosis not present

## 2023-09-01 DIAGNOSIS — I2510 Atherosclerotic heart disease of native coronary artery without angina pectoris: Secondary | ICD-10-CM | POA: Diagnosis not present

## 2023-09-01 DIAGNOSIS — R052 Subacute cough: Secondary | ICD-10-CM

## 2023-09-01 DIAGNOSIS — J849 Interstitial pulmonary disease, unspecified: Secondary | ICD-10-CM | POA: Diagnosis not present

## 2023-09-01 DIAGNOSIS — J479 Bronchiectasis, uncomplicated: Secondary | ICD-10-CM

## 2023-09-01 DIAGNOSIS — J439 Emphysema, unspecified: Secondary | ICD-10-CM | POA: Diagnosis not present

## 2023-09-03 ENCOUNTER — Telehealth: Payer: Self-pay

## 2023-09-03 NOTE — Telephone Encounter (Signed)
 Copied from CRM [id number]. Topic: General - Other >> Sep 01, 2023 12:17 PM [name] B wrote: Reason for CRM: Patient had testing today and she is not satisfied with the test and believe her results will not be accurate. She states she was not able to hear what was going on. The tech stated you did the best you can and she did not like that. Patient believes the test will not be accurate. Would like Dr Gaynell Keeler to know.  Also, patient would like Dr Gaynell Keeler to know her coughing and ability to swallow food is still going on. Feels like food is getting stuck in throat and she has to wash it down with plenty of water. Please call patient.  ATC x1 Left a detailed message to let patient know I was sending this message over to Dr.Olalere.

## 2023-09-04 DIAGNOSIS — K5790 Diverticulosis of intestine, part unspecified, without perforation or abscess without bleeding: Secondary | ICD-10-CM | POA: Diagnosis not present

## 2023-09-04 DIAGNOSIS — N3281 Overactive bladder: Secondary | ICD-10-CM | POA: Diagnosis not present

## 2023-09-04 DIAGNOSIS — G4700 Insomnia, unspecified: Secondary | ICD-10-CM | POA: Diagnosis not present

## 2023-09-04 DIAGNOSIS — K5900 Constipation, unspecified: Secondary | ICD-10-CM | POA: Diagnosis not present

## 2023-09-04 DIAGNOSIS — I350 Nonrheumatic aortic (valve) stenosis: Secondary | ICD-10-CM | POA: Diagnosis not present

## 2023-09-04 DIAGNOSIS — Z556 Problems related to health literacy: Secondary | ICD-10-CM | POA: Diagnosis not present

## 2023-09-04 DIAGNOSIS — Z5982 Transportation insecurity: Secondary | ICD-10-CM | POA: Diagnosis not present

## 2023-09-04 DIAGNOSIS — K219 Gastro-esophageal reflux disease without esophagitis: Secondary | ICD-10-CM | POA: Diagnosis not present

## 2023-09-04 DIAGNOSIS — M1990 Unspecified osteoarthritis, unspecified site: Secondary | ICD-10-CM | POA: Diagnosis not present

## 2023-09-04 DIAGNOSIS — I10 Essential (primary) hypertension: Secondary | ICD-10-CM | POA: Diagnosis not present

## 2023-09-04 DIAGNOSIS — M4800 Spinal stenosis, site unspecified: Secondary | ICD-10-CM | POA: Diagnosis not present

## 2023-09-04 DIAGNOSIS — Z604 Social exclusion and rejection: Secondary | ICD-10-CM | POA: Diagnosis not present

## 2023-09-04 DIAGNOSIS — M797 Fibromyalgia: Secondary | ICD-10-CM | POA: Diagnosis not present

## 2023-09-04 DIAGNOSIS — Z87891 Personal history of nicotine dependence: Secondary | ICD-10-CM | POA: Diagnosis not present

## 2023-09-04 DIAGNOSIS — G35 Multiple sclerosis: Secondary | ICD-10-CM | POA: Diagnosis not present

## 2023-09-07 DIAGNOSIS — M19071 Primary osteoarthritis, right ankle and foot: Secondary | ICD-10-CM | POA: Diagnosis not present

## 2023-09-07 DIAGNOSIS — M25571 Pain in right ankle and joints of right foot: Secondary | ICD-10-CM | POA: Diagnosis not present

## 2023-09-10 DIAGNOSIS — M1990 Unspecified osteoarthritis, unspecified site: Secondary | ICD-10-CM | POA: Diagnosis not present

## 2023-09-10 DIAGNOSIS — K219 Gastro-esophageal reflux disease without esophagitis: Secondary | ICD-10-CM | POA: Diagnosis not present

## 2023-09-10 DIAGNOSIS — G35 Multiple sclerosis: Secondary | ICD-10-CM | POA: Diagnosis not present

## 2023-09-10 DIAGNOSIS — M4800 Spinal stenosis, site unspecified: Secondary | ICD-10-CM | POA: Diagnosis not present

## 2023-09-10 DIAGNOSIS — K5900 Constipation, unspecified: Secondary | ICD-10-CM | POA: Diagnosis not present

## 2023-09-10 DIAGNOSIS — Z556 Problems related to health literacy: Secondary | ICD-10-CM | POA: Diagnosis not present

## 2023-09-10 DIAGNOSIS — M797 Fibromyalgia: Secondary | ICD-10-CM | POA: Diagnosis not present

## 2023-09-10 DIAGNOSIS — K5790 Diverticulosis of intestine, part unspecified, without perforation or abscess without bleeding: Secondary | ICD-10-CM | POA: Diagnosis not present

## 2023-09-10 DIAGNOSIS — I10 Essential (primary) hypertension: Secondary | ICD-10-CM | POA: Diagnosis not present

## 2023-09-10 DIAGNOSIS — Z5982 Transportation insecurity: Secondary | ICD-10-CM | POA: Diagnosis not present

## 2023-09-10 DIAGNOSIS — G4700 Insomnia, unspecified: Secondary | ICD-10-CM | POA: Diagnosis not present

## 2023-09-10 DIAGNOSIS — N3281 Overactive bladder: Secondary | ICD-10-CM | POA: Diagnosis not present

## 2023-09-10 DIAGNOSIS — I350 Nonrheumatic aortic (valve) stenosis: Secondary | ICD-10-CM | POA: Diagnosis not present

## 2023-09-10 DIAGNOSIS — Z604 Social exclusion and rejection: Secondary | ICD-10-CM | POA: Diagnosis not present

## 2023-09-10 DIAGNOSIS — Z87891 Personal history of nicotine dependence: Secondary | ICD-10-CM | POA: Diagnosis not present

## 2023-09-20 ENCOUNTER — Other Ambulatory Visit: Payer: Self-pay | Admitting: Neurology

## 2023-09-21 DIAGNOSIS — M1990 Unspecified osteoarthritis, unspecified site: Secondary | ICD-10-CM | POA: Diagnosis not present

## 2023-09-21 DIAGNOSIS — I350 Nonrheumatic aortic (valve) stenosis: Secondary | ICD-10-CM | POA: Diagnosis not present

## 2023-09-21 DIAGNOSIS — K5790 Diverticulosis of intestine, part unspecified, without perforation or abscess without bleeding: Secondary | ICD-10-CM | POA: Diagnosis not present

## 2023-09-21 DIAGNOSIS — K5900 Constipation, unspecified: Secondary | ICD-10-CM | POA: Diagnosis not present

## 2023-09-21 DIAGNOSIS — G4700 Insomnia, unspecified: Secondary | ICD-10-CM | POA: Diagnosis not present

## 2023-09-21 DIAGNOSIS — Z604 Social exclusion and rejection: Secondary | ICD-10-CM | POA: Diagnosis not present

## 2023-09-21 DIAGNOSIS — Z556 Problems related to health literacy: Secondary | ICD-10-CM | POA: Diagnosis not present

## 2023-09-21 DIAGNOSIS — Z5982 Transportation insecurity: Secondary | ICD-10-CM | POA: Diagnosis not present

## 2023-09-21 DIAGNOSIS — K219 Gastro-esophageal reflux disease without esophagitis: Secondary | ICD-10-CM | POA: Diagnosis not present

## 2023-09-21 DIAGNOSIS — M797 Fibromyalgia: Secondary | ICD-10-CM | POA: Diagnosis not present

## 2023-09-21 DIAGNOSIS — I10 Essential (primary) hypertension: Secondary | ICD-10-CM | POA: Diagnosis not present

## 2023-09-21 DIAGNOSIS — M4800 Spinal stenosis, site unspecified: Secondary | ICD-10-CM | POA: Diagnosis not present

## 2023-09-21 DIAGNOSIS — G35 Multiple sclerosis: Secondary | ICD-10-CM | POA: Diagnosis not present

## 2023-09-21 DIAGNOSIS — N3281 Overactive bladder: Secondary | ICD-10-CM | POA: Diagnosis not present

## 2023-09-21 DIAGNOSIS — Z87891 Personal history of nicotine dependence: Secondary | ICD-10-CM | POA: Diagnosis not present

## 2023-09-21 NOTE — Telephone Encounter (Signed)
 Last seen on 06/01/23 Follow up scheduled on 05/31/24

## 2023-09-22 DIAGNOSIS — M797 Fibromyalgia: Secondary | ICD-10-CM | POA: Diagnosis not present

## 2023-09-22 DIAGNOSIS — G35 Multiple sclerosis: Secondary | ICD-10-CM | POA: Diagnosis not present

## 2023-09-22 DIAGNOSIS — I10 Essential (primary) hypertension: Secondary | ICD-10-CM | POA: Diagnosis not present

## 2023-09-22 DIAGNOSIS — M4800 Spinal stenosis, site unspecified: Secondary | ICD-10-CM | POA: Diagnosis not present

## 2023-09-23 ENCOUNTER — Other Ambulatory Visit

## 2023-09-23 ENCOUNTER — Telehealth: Payer: Self-pay | Admitting: Neurology

## 2023-09-23 NOTE — Telephone Encounter (Signed)
 Pt is asking for a call from RN to discuss her legs twitching and her having an increase in falls

## 2023-09-23 NOTE — Telephone Encounter (Signed)
 Last seen 06/01/23. Next f/u 05/31/24.   Per last note:  1.   She will remain off of a disease modifying therapy for MS (SPMS).   She has been stable.  We discussed the natural history of MS. 2.   Continue Dalfampridine  for MS gait.    She is on a very low dose of tramadol  and we discussed risk of seizure may be higher on the combination.  Advised not to increase the dose.   Renal function is normal 3.   Stay active.  Exercise as tolerated 4,   Trazodone  Requip  and gabapentin  for RLS and sleep.   Gemtesa  and solifenacin  for bladder.    On sertraline  and buspar  with mood with good control 5.    Due to poor balance and risk of falls, continue to use the rollator style walker.   6.   BP amlodipine , hydrochlorothiazide , losartanShe will return in 12 months or sooner if there are new or worsening neurologic symptoms.  I called pt at [phone]. Reports worsening leg twitching. Legs feel hot around ankles bilaterally. Feels on fire. Describes as a squeezing feeling. Right worse than left side. Sx worsened several weeks ago Severity intermittent.  She is falling d/t sx. Having decreased coordination. Tries to sit down and slides onto floor. Confirmed she is using rollator walker at all times. When she walks outside, legs feel like they will give way. Denies any signs/sx of UTI. Has continued GI upset after she eats, develops diarrhea. Confirmed she is taking meds as prescribed. Aware I will speak with MD and call back.

## 2023-09-23 NOTE — Telephone Encounter (Signed)
 Called pt and relayed Dr. Duncan message. She verbalized understanding.  She is already doing physical therapy once per week. She will continue this. She will monitor GI sx and f/u with PCP if they worsen.  She may look into going into assisted living. She will let us  know what she decides on this.

## 2023-09-30 DIAGNOSIS — K5900 Constipation, unspecified: Secondary | ICD-10-CM | POA: Diagnosis not present

## 2023-09-30 DIAGNOSIS — Z556 Problems related to health literacy: Secondary | ICD-10-CM | POA: Diagnosis not present

## 2023-09-30 DIAGNOSIS — Z5982 Transportation insecurity: Secondary | ICD-10-CM | POA: Diagnosis not present

## 2023-09-30 DIAGNOSIS — I350 Nonrheumatic aortic (valve) stenosis: Secondary | ICD-10-CM | POA: Diagnosis not present

## 2023-09-30 DIAGNOSIS — M4800 Spinal stenosis, site unspecified: Secondary | ICD-10-CM | POA: Diagnosis not present

## 2023-09-30 DIAGNOSIS — M797 Fibromyalgia: Secondary | ICD-10-CM | POA: Diagnosis not present

## 2023-09-30 DIAGNOSIS — G4700 Insomnia, unspecified: Secondary | ICD-10-CM | POA: Diagnosis not present

## 2023-09-30 DIAGNOSIS — Z604 Social exclusion and rejection: Secondary | ICD-10-CM | POA: Diagnosis not present

## 2023-09-30 DIAGNOSIS — K5790 Diverticulosis of intestine, part unspecified, without perforation or abscess without bleeding: Secondary | ICD-10-CM | POA: Diagnosis not present

## 2023-09-30 DIAGNOSIS — N3281 Overactive bladder: Secondary | ICD-10-CM | POA: Diagnosis not present

## 2023-09-30 DIAGNOSIS — K219 Gastro-esophageal reflux disease without esophagitis: Secondary | ICD-10-CM | POA: Diagnosis not present

## 2023-09-30 DIAGNOSIS — Z87891 Personal history of nicotine dependence: Secondary | ICD-10-CM | POA: Diagnosis not present

## 2023-09-30 DIAGNOSIS — M1990 Unspecified osteoarthritis, unspecified site: Secondary | ICD-10-CM | POA: Diagnosis not present

## 2023-09-30 DIAGNOSIS — G35 Multiple sclerosis: Secondary | ICD-10-CM | POA: Diagnosis not present

## 2023-09-30 DIAGNOSIS — I10 Essential (primary) hypertension: Secondary | ICD-10-CM | POA: Diagnosis not present

## 2023-10-01 DIAGNOSIS — I10 Essential (primary) hypertension: Secondary | ICD-10-CM | POA: Diagnosis not present

## 2023-10-01 DIAGNOSIS — I350 Nonrheumatic aortic (valve) stenosis: Secondary | ICD-10-CM | POA: Diagnosis not present

## 2023-10-01 DIAGNOSIS — Z604 Social exclusion and rejection: Secondary | ICD-10-CM | POA: Diagnosis not present

## 2023-10-01 DIAGNOSIS — Z556 Problems related to health literacy: Secondary | ICD-10-CM | POA: Diagnosis not present

## 2023-10-01 DIAGNOSIS — K5790 Diverticulosis of intestine, part unspecified, without perforation or abscess without bleeding: Secondary | ICD-10-CM | POA: Diagnosis not present

## 2023-10-01 DIAGNOSIS — M1990 Unspecified osteoarthritis, unspecified site: Secondary | ICD-10-CM | POA: Diagnosis not present

## 2023-10-01 DIAGNOSIS — G4700 Insomnia, unspecified: Secondary | ICD-10-CM | POA: Diagnosis not present

## 2023-10-01 DIAGNOSIS — G35 Multiple sclerosis: Secondary | ICD-10-CM | POA: Diagnosis not present

## 2023-10-01 DIAGNOSIS — M797 Fibromyalgia: Secondary | ICD-10-CM | POA: Diagnosis not present

## 2023-10-01 DIAGNOSIS — M4800 Spinal stenosis, site unspecified: Secondary | ICD-10-CM | POA: Diagnosis not present

## 2023-10-01 DIAGNOSIS — K5900 Constipation, unspecified: Secondary | ICD-10-CM | POA: Diagnosis not present

## 2023-10-01 DIAGNOSIS — Z5982 Transportation insecurity: Secondary | ICD-10-CM | POA: Diagnosis not present

## 2023-10-01 DIAGNOSIS — N3281 Overactive bladder: Secondary | ICD-10-CM | POA: Diagnosis not present

## 2023-10-01 DIAGNOSIS — K219 Gastro-esophageal reflux disease without esophagitis: Secondary | ICD-10-CM | POA: Diagnosis not present

## 2023-10-01 DIAGNOSIS — Z87891 Personal history of nicotine dependence: Secondary | ICD-10-CM | POA: Diagnosis not present

## 2023-10-08 DIAGNOSIS — G35 Multiple sclerosis: Secondary | ICD-10-CM | POA: Diagnosis not present

## 2023-10-08 DIAGNOSIS — Z556 Problems related to health literacy: Secondary | ICD-10-CM | POA: Diagnosis not present

## 2023-10-08 DIAGNOSIS — Z87891 Personal history of nicotine dependence: Secondary | ICD-10-CM | POA: Diagnosis not present

## 2023-10-08 DIAGNOSIS — K5790 Diverticulosis of intestine, part unspecified, without perforation or abscess without bleeding: Secondary | ICD-10-CM | POA: Diagnosis not present

## 2023-10-08 DIAGNOSIS — M797 Fibromyalgia: Secondary | ICD-10-CM | POA: Diagnosis not present

## 2023-10-08 DIAGNOSIS — M4800 Spinal stenosis, site unspecified: Secondary | ICD-10-CM | POA: Diagnosis not present

## 2023-10-08 DIAGNOSIS — Z5982 Transportation insecurity: Secondary | ICD-10-CM | POA: Diagnosis not present

## 2023-10-08 DIAGNOSIS — K219 Gastro-esophageal reflux disease without esophagitis: Secondary | ICD-10-CM | POA: Diagnosis not present

## 2023-10-08 DIAGNOSIS — M1990 Unspecified osteoarthritis, unspecified site: Secondary | ICD-10-CM | POA: Diagnosis not present

## 2023-10-08 DIAGNOSIS — K5900 Constipation, unspecified: Secondary | ICD-10-CM | POA: Diagnosis not present

## 2023-10-08 DIAGNOSIS — I350 Nonrheumatic aortic (valve) stenosis: Secondary | ICD-10-CM | POA: Diagnosis not present

## 2023-10-08 DIAGNOSIS — Z604 Social exclusion and rejection: Secondary | ICD-10-CM | POA: Diagnosis not present

## 2023-10-08 DIAGNOSIS — I10 Essential (primary) hypertension: Secondary | ICD-10-CM | POA: Diagnosis not present

## 2023-10-08 DIAGNOSIS — N3281 Overactive bladder: Secondary | ICD-10-CM | POA: Diagnosis not present

## 2023-10-08 DIAGNOSIS — G4700 Insomnia, unspecified: Secondary | ICD-10-CM | POA: Diagnosis not present

## 2023-10-12 ENCOUNTER — Other Ambulatory Visit (HOSPITAL_BASED_OUTPATIENT_CLINIC_OR_DEPARTMENT_OTHER)

## 2023-10-15 DIAGNOSIS — K5790 Diverticulosis of intestine, part unspecified, without perforation or abscess without bleeding: Secondary | ICD-10-CM | POA: Diagnosis not present

## 2023-10-15 DIAGNOSIS — Z5982 Transportation insecurity: Secondary | ICD-10-CM | POA: Diagnosis not present

## 2023-10-15 DIAGNOSIS — I350 Nonrheumatic aortic (valve) stenosis: Secondary | ICD-10-CM | POA: Diagnosis not present

## 2023-10-15 DIAGNOSIS — M797 Fibromyalgia: Secondary | ICD-10-CM | POA: Diagnosis not present

## 2023-10-15 DIAGNOSIS — M1990 Unspecified osteoarthritis, unspecified site: Secondary | ICD-10-CM | POA: Diagnosis not present

## 2023-10-15 DIAGNOSIS — G4700 Insomnia, unspecified: Secondary | ICD-10-CM | POA: Diagnosis not present

## 2023-10-15 DIAGNOSIS — K219 Gastro-esophageal reflux disease without esophagitis: Secondary | ICD-10-CM | POA: Diagnosis not present

## 2023-10-15 DIAGNOSIS — M4800 Spinal stenosis, site unspecified: Secondary | ICD-10-CM | POA: Diagnosis not present

## 2023-10-15 DIAGNOSIS — Z604 Social exclusion and rejection: Secondary | ICD-10-CM | POA: Diagnosis not present

## 2023-10-15 DIAGNOSIS — I10 Essential (primary) hypertension: Secondary | ICD-10-CM | POA: Diagnosis not present

## 2023-10-15 DIAGNOSIS — G35 Multiple sclerosis: Secondary | ICD-10-CM | POA: Diagnosis not present

## 2023-10-15 DIAGNOSIS — N3281 Overactive bladder: Secondary | ICD-10-CM | POA: Diagnosis not present

## 2023-10-15 DIAGNOSIS — K5900 Constipation, unspecified: Secondary | ICD-10-CM | POA: Diagnosis not present

## 2023-10-15 DIAGNOSIS — Z556 Problems related to health literacy: Secondary | ICD-10-CM | POA: Diagnosis not present

## 2023-10-15 DIAGNOSIS — Z87891 Personal history of nicotine dependence: Secondary | ICD-10-CM | POA: Diagnosis not present

## 2023-10-19 ENCOUNTER — Ambulatory Visit: Admitting: Pulmonary Disease

## 2023-10-20 DIAGNOSIS — G35 Multiple sclerosis: Secondary | ICD-10-CM | POA: Diagnosis not present

## 2023-10-20 DIAGNOSIS — K219 Gastro-esophageal reflux disease without esophagitis: Secondary | ICD-10-CM | POA: Diagnosis not present

## 2023-10-20 DIAGNOSIS — N3281 Overactive bladder: Secondary | ICD-10-CM | POA: Diagnosis not present

## 2023-10-20 DIAGNOSIS — K5900 Constipation, unspecified: Secondary | ICD-10-CM | POA: Diagnosis not present

## 2023-10-20 DIAGNOSIS — Z604 Social exclusion and rejection: Secondary | ICD-10-CM | POA: Diagnosis not present

## 2023-10-20 DIAGNOSIS — Z556 Problems related to health literacy: Secondary | ICD-10-CM | POA: Diagnosis not present

## 2023-10-20 DIAGNOSIS — Z87891 Personal history of nicotine dependence: Secondary | ICD-10-CM | POA: Diagnosis not present

## 2023-10-20 DIAGNOSIS — M4800 Spinal stenosis, site unspecified: Secondary | ICD-10-CM | POA: Diagnosis not present

## 2023-10-20 DIAGNOSIS — Z5982 Transportation insecurity: Secondary | ICD-10-CM | POA: Diagnosis not present

## 2023-10-20 DIAGNOSIS — M1990 Unspecified osteoarthritis, unspecified site: Secondary | ICD-10-CM | POA: Diagnosis not present

## 2023-10-20 DIAGNOSIS — M797 Fibromyalgia: Secondary | ICD-10-CM | POA: Diagnosis not present

## 2023-10-20 DIAGNOSIS — I10 Essential (primary) hypertension: Secondary | ICD-10-CM | POA: Diagnosis not present

## 2023-10-20 DIAGNOSIS — K5790 Diverticulosis of intestine, part unspecified, without perforation or abscess without bleeding: Secondary | ICD-10-CM | POA: Diagnosis not present

## 2023-10-20 DIAGNOSIS — I350 Nonrheumatic aortic (valve) stenosis: Secondary | ICD-10-CM | POA: Diagnosis not present

## 2023-10-20 DIAGNOSIS — G4700 Insomnia, unspecified: Secondary | ICD-10-CM | POA: Diagnosis not present

## 2023-10-21 ENCOUNTER — Ambulatory Visit
Admission: RE | Admit: 2023-10-21 | Discharge: 2023-10-21 | Disposition: A | Discharge status: Home / Self Care | Source: Ambulatory Visit | Attending: Neurology | Admitting: Neurology

## 2023-10-21 DIAGNOSIS — M5431 Sciatica, right side: Secondary | ICD-10-CM

## 2023-10-21 DIAGNOSIS — M5432 Sciatica, left side: Secondary | ICD-10-CM

## 2023-10-21 DIAGNOSIS — Z981 Arthrodesis status: Secondary | ICD-10-CM | POA: Diagnosis not present

## 2023-10-21 DIAGNOSIS — G35 Multiple sclerosis: Secondary | ICD-10-CM | POA: Diagnosis not present

## 2023-10-21 MED ORDER — GADOPICLENOL 0.5 MMOL/ML IV SOLN
8.0000 mL | Freq: Once | INTRAVENOUS | Status: AC | PRN
  Administered 2023-10-21: 8 mL via INTRAVENOUS

## 2023-10-23 ENCOUNTER — Ambulatory Visit: Payer: Self-pay | Admitting: Neurology

## 2023-10-27 NOTE — Telephone Encounter (Addendum)
 Called pt and let her know the below results Per Dr. Vear.    Pt is wanting to know in better detail where her referral is going to be sent to for the Neurosurgeon and who the Neurosurgeon will be. Pt also wants to know who will be doing her Epidural Steroid Injection if she decides to get that done.  Sending to MD ----- Message from Perla Simpers sent at 10/27/2023 10:49 AM EDT -----

## 2023-10-27 NOTE — Telephone Encounter (Signed)
 Pt states the person that called to go over the results of the MRI was unable to fully explain the results of the scan to her.  Pt is asking to be called by Maurilio, RN for a better explanation of th results

## 2023-10-27 NOTE — Telephone Encounter (Signed)
 Pt called to request MRI results . Pt would like call back once ready

## 2023-10-28 NOTE — Telephone Encounter (Signed)
 Called pt back and re-explained her MRI results to her. Pt states that she needs to let her family know so they can collectively make a decision about either doing the Epidural Steroid Injection, or doing the referral for Neurosurgery. Pt was very rude while talking to her on the phone.

## 2023-10-29 DIAGNOSIS — G35 Multiple sclerosis: Secondary | ICD-10-CM | POA: Diagnosis not present

## 2023-10-29 DIAGNOSIS — M1990 Unspecified osteoarthritis, unspecified site: Secondary | ICD-10-CM | POA: Diagnosis not present

## 2023-10-29 DIAGNOSIS — Z556 Problems related to health literacy: Secondary | ICD-10-CM | POA: Diagnosis not present

## 2023-10-29 DIAGNOSIS — I10 Essential (primary) hypertension: Secondary | ICD-10-CM | POA: Diagnosis not present

## 2023-10-29 DIAGNOSIS — M4800 Spinal stenosis, site unspecified: Secondary | ICD-10-CM | POA: Diagnosis not present

## 2023-10-29 DIAGNOSIS — Z604 Social exclusion and rejection: Secondary | ICD-10-CM | POA: Diagnosis not present

## 2023-10-29 DIAGNOSIS — I350 Nonrheumatic aortic (valve) stenosis: Secondary | ICD-10-CM | POA: Diagnosis not present

## 2023-10-29 DIAGNOSIS — K5790 Diverticulosis of intestine, part unspecified, without perforation or abscess without bleeding: Secondary | ICD-10-CM | POA: Diagnosis not present

## 2023-10-29 DIAGNOSIS — K219 Gastro-esophageal reflux disease without esophagitis: Secondary | ICD-10-CM | POA: Diagnosis not present

## 2023-10-29 DIAGNOSIS — K5900 Constipation, unspecified: Secondary | ICD-10-CM | POA: Diagnosis not present

## 2023-10-29 DIAGNOSIS — Z5982 Transportation insecurity: Secondary | ICD-10-CM | POA: Diagnosis not present

## 2023-10-29 DIAGNOSIS — G4700 Insomnia, unspecified: Secondary | ICD-10-CM | POA: Diagnosis not present

## 2023-10-29 DIAGNOSIS — M797 Fibromyalgia: Secondary | ICD-10-CM | POA: Diagnosis not present

## 2023-10-29 DIAGNOSIS — Z87891 Personal history of nicotine dependence: Secondary | ICD-10-CM | POA: Diagnosis not present

## 2023-10-29 DIAGNOSIS — N3281 Overactive bladder: Secondary | ICD-10-CM | POA: Diagnosis not present

## 2023-11-02 NOTE — Progress Notes (Signed)
 Will call pt back (KRS)

## 2023-11-02 NOTE — Telephone Encounter (Signed)
 Called pt at [phone].  Went over results again. She wrote down results and read back correctly. She is going to give options more thought and call back to let us  know how to proceed. In the meantime, she would like a copy of the results. Aware I will send to Debra/medical records to help with this request. She verbalized understanding.

## 2023-11-04 NOTE — Telephone Encounter (Signed)
 Called pt back and re-explained that Dr. Vear gave her 2 treatment options to either get an Epidural Steroid Injection or to get a referral to Neurosurgery. I spelled everything out for the pt as she wrote everything down. Pt verbalized understanding. Pt states that she has to talk to her family to best choose her treatment options.

## 2023-11-04 NOTE — Telephone Encounter (Signed)
 Pt is asking for a call from RN to discuss again the options /suggestions made by Dr Vear for her to do

## 2023-11-05 DIAGNOSIS — G4700 Insomnia, unspecified: Secondary | ICD-10-CM | POA: Diagnosis not present

## 2023-11-05 DIAGNOSIS — M1990 Unspecified osteoarthritis, unspecified site: Secondary | ICD-10-CM | POA: Diagnosis not present

## 2023-11-05 DIAGNOSIS — Z87891 Personal history of nicotine dependence: Secondary | ICD-10-CM | POA: Diagnosis not present

## 2023-11-05 DIAGNOSIS — K5900 Constipation, unspecified: Secondary | ICD-10-CM | POA: Diagnosis not present

## 2023-11-05 DIAGNOSIS — I350 Nonrheumatic aortic (valve) stenosis: Secondary | ICD-10-CM | POA: Diagnosis not present

## 2023-11-05 DIAGNOSIS — K219 Gastro-esophageal reflux disease without esophagitis: Secondary | ICD-10-CM | POA: Diagnosis not present

## 2023-11-05 DIAGNOSIS — Z604 Social exclusion and rejection: Secondary | ICD-10-CM | POA: Diagnosis not present

## 2023-11-05 DIAGNOSIS — M4800 Spinal stenosis, site unspecified: Secondary | ICD-10-CM | POA: Diagnosis not present

## 2023-11-05 DIAGNOSIS — G35 Multiple sclerosis: Secondary | ICD-10-CM | POA: Diagnosis not present

## 2023-11-05 DIAGNOSIS — M797 Fibromyalgia: Secondary | ICD-10-CM | POA: Diagnosis not present

## 2023-11-05 DIAGNOSIS — K5790 Diverticulosis of intestine, part unspecified, without perforation or abscess without bleeding: Secondary | ICD-10-CM | POA: Diagnosis not present

## 2023-11-05 DIAGNOSIS — Z556 Problems related to health literacy: Secondary | ICD-10-CM | POA: Diagnosis not present

## 2023-11-05 DIAGNOSIS — N3281 Overactive bladder: Secondary | ICD-10-CM | POA: Diagnosis not present

## 2023-11-05 DIAGNOSIS — Z5982 Transportation insecurity: Secondary | ICD-10-CM | POA: Diagnosis not present

## 2023-11-05 DIAGNOSIS — I10 Essential (primary) hypertension: Secondary | ICD-10-CM | POA: Diagnosis not present

## 2023-11-09 ENCOUNTER — Other Ambulatory Visit: Payer: Self-pay

## 2023-11-09 ENCOUNTER — Telehealth: Payer: Self-pay | Admitting: Neurology

## 2023-11-09 DIAGNOSIS — M5432 Sciatica, left side: Secondary | ICD-10-CM

## 2023-11-09 DIAGNOSIS — M5431 Sciatica, right side: Secondary | ICD-10-CM

## 2023-11-09 NOTE — Telephone Encounter (Addendum)
 Called pt back and answered her question about Epidural Steroid Injections. Asked Dr. Vear how long does the Epidural Steroid Injections last and he stated that they can last a couple of months and sometimes pt's will have to come back and get another 1. Per Dr. Vear some pt's will have 1 of them and be fine and won't have to repeat it, or will have to do 2 in a year and then after that they will have to go to Neurosurgery. Let pt know what Dr. Vear stated. Pt verbalized understanding.

## 2023-11-09 NOTE — Telephone Encounter (Signed)
 KS,CMA placed ESI order

## 2023-11-09 NOTE — Telephone Encounter (Signed)
 Pt has called to report that she would like to go with getting an Epidural Steroid Injection , please call to discuss moving forward

## 2023-11-09 NOTE — Progress Notes (Signed)
 Placed order for Epidural Steroid Injection

## 2023-11-10 ENCOUNTER — Ambulatory Visit: Admitting: Pulmonary Disease

## 2023-11-10 ENCOUNTER — Encounter

## 2023-11-10 DIAGNOSIS — J479 Bronchiectasis, uncomplicated: Secondary | ICD-10-CM

## 2023-11-10 DIAGNOSIS — R052 Subacute cough: Secondary | ICD-10-CM

## 2023-11-12 DIAGNOSIS — Z556 Problems related to health literacy: Secondary | ICD-10-CM | POA: Diagnosis not present

## 2023-11-12 DIAGNOSIS — M1990 Unspecified osteoarthritis, unspecified site: Secondary | ICD-10-CM | POA: Diagnosis not present

## 2023-11-12 DIAGNOSIS — M4800 Spinal stenosis, site unspecified: Secondary | ICD-10-CM | POA: Diagnosis not present

## 2023-11-12 DIAGNOSIS — I10 Essential (primary) hypertension: Secondary | ICD-10-CM | POA: Diagnosis not present

## 2023-11-12 DIAGNOSIS — K219 Gastro-esophageal reflux disease without esophagitis: Secondary | ICD-10-CM | POA: Diagnosis not present

## 2023-11-12 DIAGNOSIS — Z87891 Personal history of nicotine dependence: Secondary | ICD-10-CM | POA: Diagnosis not present

## 2023-11-12 DIAGNOSIS — G35 Multiple sclerosis: Secondary | ICD-10-CM | POA: Diagnosis not present

## 2023-11-12 DIAGNOSIS — N3281 Overactive bladder: Secondary | ICD-10-CM | POA: Diagnosis not present

## 2023-11-12 DIAGNOSIS — M797 Fibromyalgia: Secondary | ICD-10-CM | POA: Diagnosis not present

## 2023-11-12 DIAGNOSIS — I350 Nonrheumatic aortic (valve) stenosis: Secondary | ICD-10-CM | POA: Diagnosis not present

## 2023-11-12 DIAGNOSIS — Z604 Social exclusion and rejection: Secondary | ICD-10-CM | POA: Diagnosis not present

## 2023-11-12 DIAGNOSIS — K5790 Diverticulosis of intestine, part unspecified, without perforation or abscess without bleeding: Secondary | ICD-10-CM | POA: Diagnosis not present

## 2023-11-12 DIAGNOSIS — Z5982 Transportation insecurity: Secondary | ICD-10-CM | POA: Diagnosis not present

## 2023-11-12 DIAGNOSIS — K5900 Constipation, unspecified: Secondary | ICD-10-CM | POA: Diagnosis not present

## 2023-11-12 DIAGNOSIS — G4700 Insomnia, unspecified: Secondary | ICD-10-CM | POA: Diagnosis not present

## 2023-11-16 NOTE — Discharge Instructions (Signed)

## 2023-11-17 ENCOUNTER — Inpatient Hospital Stay
Admission: RE | Admit: 2023-11-17 | Discharge: 2023-11-17 | Disposition: A | Discharge status: Home / Self Care | Source: Ambulatory Visit | Attending: Neurology | Admitting: Neurology

## 2023-11-17 DIAGNOSIS — K219 Gastro-esophageal reflux disease without esophagitis: Secondary | ICD-10-CM | POA: Diagnosis not present

## 2023-11-17 DIAGNOSIS — Z5982 Transportation insecurity: Secondary | ICD-10-CM | POA: Diagnosis not present

## 2023-11-17 DIAGNOSIS — G4700 Insomnia, unspecified: Secondary | ICD-10-CM | POA: Diagnosis not present

## 2023-11-17 DIAGNOSIS — M797 Fibromyalgia: Secondary | ICD-10-CM | POA: Diagnosis not present

## 2023-11-17 DIAGNOSIS — G35 Multiple sclerosis: Secondary | ICD-10-CM | POA: Diagnosis not present

## 2023-11-17 DIAGNOSIS — K5790 Diverticulosis of intestine, part unspecified, without perforation or abscess without bleeding: Secondary | ICD-10-CM | POA: Diagnosis not present

## 2023-11-17 DIAGNOSIS — M4800 Spinal stenosis, site unspecified: Secondary | ICD-10-CM | POA: Diagnosis not present

## 2023-11-17 DIAGNOSIS — I10 Essential (primary) hypertension: Secondary | ICD-10-CM | POA: Diagnosis not present

## 2023-11-17 DIAGNOSIS — Z556 Problems related to health literacy: Secondary | ICD-10-CM | POA: Diagnosis not present

## 2023-11-17 DIAGNOSIS — Z604 Social exclusion and rejection: Secondary | ICD-10-CM | POA: Diagnosis not present

## 2023-11-17 DIAGNOSIS — Z87891 Personal history of nicotine dependence: Secondary | ICD-10-CM | POA: Diagnosis not present

## 2023-11-17 DIAGNOSIS — I350 Nonrheumatic aortic (valve) stenosis: Secondary | ICD-10-CM | POA: Diagnosis not present

## 2023-11-17 DIAGNOSIS — M1990 Unspecified osteoarthritis, unspecified site: Secondary | ICD-10-CM | POA: Diagnosis not present

## 2023-11-17 DIAGNOSIS — K5900 Constipation, unspecified: Secondary | ICD-10-CM | POA: Diagnosis not present

## 2023-11-17 DIAGNOSIS — N3281 Overactive bladder: Secondary | ICD-10-CM | POA: Diagnosis not present

## 2023-11-24 DIAGNOSIS — R7303 Prediabetes: Secondary | ICD-10-CM | POA: Diagnosis not present

## 2023-11-24 DIAGNOSIS — G35 Multiple sclerosis: Secondary | ICD-10-CM | POA: Diagnosis not present

## 2023-11-24 DIAGNOSIS — M797 Fibromyalgia: Secondary | ICD-10-CM | POA: Diagnosis not present

## 2023-11-24 DIAGNOSIS — E7800 Pure hypercholesterolemia, unspecified: Secondary | ICD-10-CM | POA: Diagnosis not present

## 2023-11-24 DIAGNOSIS — G4700 Insomnia, unspecified: Secondary | ICD-10-CM | POA: Diagnosis not present

## 2023-11-24 DIAGNOSIS — K219 Gastro-esophageal reflux disease without esophagitis: Secondary | ICD-10-CM | POA: Diagnosis not present

## 2023-11-24 DIAGNOSIS — K5900 Constipation, unspecified: Secondary | ICD-10-CM | POA: Diagnosis not present

## 2023-11-24 DIAGNOSIS — I10 Essential (primary) hypertension: Secondary | ICD-10-CM | POA: Diagnosis not present

## 2023-11-25 NOTE — Discharge Instructions (Signed)

## 2023-11-26 ENCOUNTER — Inpatient Hospital Stay
Admission: RE | Admit: 2023-11-26 | Discharge: 2023-11-26 | Disposition: A | Discharge status: Home / Self Care | Source: Ambulatory Visit | Attending: Neurology | Admitting: Neurology

## 2023-11-26 DIAGNOSIS — M4316 Spondylolisthesis, lumbar region: Secondary | ICD-10-CM | POA: Diagnosis not present

## 2023-11-26 DIAGNOSIS — M5432 Sciatica, left side: Secondary | ICD-10-CM

## 2023-11-26 DIAGNOSIS — M5431 Sciatica, right side: Secondary | ICD-10-CM

## 2023-11-26 DIAGNOSIS — M48061 Spinal stenosis, lumbar region without neurogenic claudication: Secondary | ICD-10-CM | POA: Diagnosis not present

## 2023-11-26 DIAGNOSIS — M47817 Spondylosis without myelopathy or radiculopathy, lumbosacral region: Secondary | ICD-10-CM | POA: Diagnosis not present

## 2023-11-26 MED ORDER — METHYLPREDNISOLONE ACETATE 40 MG/ML INJ SUSP (RADIOLOG
80.0000 mg | Freq: Once | INTRAMUSCULAR | Status: AC
  Administered 2023-11-26: 80 mg via EPIDURAL

## 2023-11-26 MED ORDER — IOPAMIDOL (ISOVUE-M 200) INJECTION 41%
1.0000 mL | Freq: Once | INTRAMUSCULAR | Status: AC
  Administered 2023-11-26: 1 mL via EPIDURAL

## 2023-12-07 ENCOUNTER — Telehealth: Payer: Self-pay | Admitting: Neurology

## 2023-12-07 DIAGNOSIS — Z981 Arthrodesis status: Secondary | ICD-10-CM

## 2023-12-07 DIAGNOSIS — M25552 Pain in left hip: Secondary | ICD-10-CM

## 2023-12-07 DIAGNOSIS — M5431 Sciatica, right side: Secondary | ICD-10-CM

## 2023-12-07 DIAGNOSIS — M79604 Pain in right leg: Secondary | ICD-10-CM

## 2023-12-07 DIAGNOSIS — M5432 Sciatica, left side: Secondary | ICD-10-CM

## 2023-12-07 MED ORDER — GABAPENTIN 300 MG PO CAPS: 300.0000 mg | ORAL_CAPSULE | Freq: Every day | ORAL | 3 refills | Status: DC

## 2023-12-07 NOTE — Telephone Encounter (Signed)
 Referral for neurosurgery fax to [facility]. Phone: [phone], Fax: [phone]

## 2023-12-07 NOTE — Telephone Encounter (Signed)
 Pt called stating she recently had a procedure done by Md and her legs has been on fire ,. Pt state she has been in so much pain defiantly in her legs . PT is concern and want to speak to MD about different treatment

## 2023-12-07 NOTE — Telephone Encounter (Signed)
 Referral to Neurosurgery already placed. Called pt at [phone]. Relayed Dr. Duncan recommendation about  increasing her Gabapentin  dose. She is agreeable. New rx called into below pharmacy per pt request.   Pharmacy:  [facility] [place], [place] - [address] Phone: [phone]  Fax: [phone]

## 2023-12-07 NOTE — Telephone Encounter (Signed)
 Pt has called to inform Maurilio, RN that she has run across some extra Gabapentin  previously sent to her, this is FYI she is not asking for a call back to discuss, no changes need to be made to any previous request.

## 2023-12-07 NOTE — Addendum Note (Signed)
 Addended by: JOSHUA MAURILIO CROME on: 12/07/2023 01:09 PM   Modules accepted: Orders

## 2023-12-07 NOTE — Telephone Encounter (Signed)
 Pt last saw Dr. Vear 06/01/23. Next f/u 05/31/24.  She had ESI 11/26/23 at [facility] imaging for sciatica. I called pt back.  Reports having increased pain in buttocks/legs bilaterally. Describes as burning pain. Worse when she sits down and gets back up. Feels ESI only helped for 7-10 days.  She is agreeable to referral to neurosurgery at this time for further evaluation/treatment. Gave her phone# to [facility] to program in phone [phone] so she will recognize phone# calling to schedule her appt.  Previous recommendation from Dr. Vear:

## 2023-12-15 DIAGNOSIS — G4700 Insomnia, unspecified: Secondary | ICD-10-CM | POA: Diagnosis not present

## 2023-12-15 DIAGNOSIS — Z0000 Encounter for general adult medical examination without abnormal findings: Secondary | ICD-10-CM | POA: Diagnosis not present

## 2023-12-15 DIAGNOSIS — Z9181 History of falling: Secondary | ICD-10-CM | POA: Diagnosis not present

## 2023-12-15 DIAGNOSIS — E7800 Pure hypercholesterolemia, unspecified: Secondary | ICD-10-CM | POA: Diagnosis not present

## 2023-12-15 DIAGNOSIS — K219 Gastro-esophageal reflux disease without esophagitis: Secondary | ICD-10-CM | POA: Diagnosis not present

## 2023-12-15 DIAGNOSIS — R29898 Other symptoms and signs involving the musculoskeletal system: Secondary | ICD-10-CM | POA: Diagnosis not present

## 2023-12-15 DIAGNOSIS — G35 Multiple sclerosis: Secondary | ICD-10-CM | POA: Diagnosis not present

## 2023-12-15 DIAGNOSIS — J309 Allergic rhinitis, unspecified: Secondary | ICD-10-CM | POA: Diagnosis not present

## 2023-12-21 DIAGNOSIS — E785 Hyperlipidemia, unspecified: Secondary | ICD-10-CM | POA: Diagnosis not present

## 2023-12-21 DIAGNOSIS — K5900 Constipation, unspecified: Secondary | ICD-10-CM | POA: Diagnosis not present

## 2023-12-21 DIAGNOSIS — M1990 Unspecified osteoarthritis, unspecified site: Secondary | ICD-10-CM | POA: Diagnosis not present

## 2023-12-21 DIAGNOSIS — L304 Erythema intertrigo: Secondary | ICD-10-CM | POA: Diagnosis not present

## 2023-12-21 DIAGNOSIS — H9190 Unspecified hearing loss, unspecified ear: Secondary | ICD-10-CM | POA: Diagnosis not present

## 2023-12-21 DIAGNOSIS — G35 Multiple sclerosis: Secondary | ICD-10-CM | POA: Diagnosis not present

## 2023-12-21 DIAGNOSIS — K219 Gastro-esophageal reflux disease without esophagitis: Secondary | ICD-10-CM | POA: Diagnosis not present

## 2023-12-21 DIAGNOSIS — Z87891 Personal history of nicotine dependence: Secondary | ICD-10-CM | POA: Diagnosis not present

## 2023-12-21 DIAGNOSIS — Z556 Problems related to health literacy: Secondary | ICD-10-CM | POA: Diagnosis not present

## 2023-12-21 DIAGNOSIS — L408 Other psoriasis: Secondary | ICD-10-CM | POA: Diagnosis not present

## 2023-12-21 DIAGNOSIS — Z981 Arthrodesis status: Secondary | ICD-10-CM | POA: Diagnosis not present

## 2023-12-21 DIAGNOSIS — G4700 Insomnia, unspecified: Secondary | ICD-10-CM | POA: Diagnosis not present

## 2023-12-21 DIAGNOSIS — K5790 Diverticulosis of intestine, part unspecified, without perforation or abscess without bleeding: Secondary | ICD-10-CM | POA: Diagnosis not present

## 2023-12-21 DIAGNOSIS — I119 Hypertensive heart disease without heart failure: Secondary | ICD-10-CM | POA: Diagnosis not present

## 2023-12-21 DIAGNOSIS — I341 Nonrheumatic mitral (valve) prolapse: Secondary | ICD-10-CM | POA: Diagnosis not present

## 2023-12-21 DIAGNOSIS — Z9049 Acquired absence of other specified parts of digestive tract: Secondary | ICD-10-CM | POA: Diagnosis not present

## 2023-12-21 DIAGNOSIS — M4800 Spinal stenosis, site unspecified: Secondary | ICD-10-CM | POA: Diagnosis not present

## 2023-12-21 DIAGNOSIS — I700 Atherosclerosis of aorta: Secondary | ICD-10-CM | POA: Diagnosis not present

## 2023-12-21 DIAGNOSIS — N3281 Overactive bladder: Secondary | ICD-10-CM | POA: Diagnosis not present

## 2023-12-21 DIAGNOSIS — Z9089 Acquired absence of other organs: Secondary | ICD-10-CM | POA: Diagnosis not present

## 2023-12-24 ENCOUNTER — Other Ambulatory Visit: Payer: Self-pay | Admitting: Neurology

## 2023-12-24 NOTE — Telephone Encounter (Signed)
 Last seen on 06/01/23 Follow up scheduled on 05/31/24   Dispensed Days Supply Quantity Provider Pharmacy  BACLOFEN   10 MG TABS 12/16/2023 30 90 tablet Sater, Charlie LABOR, MD [id number]      Dispensed Days Supply Quantity Provider Pharmacy  SERTRALINE  HYDROCHLORIDE  100 MG TABS 12/16/2023 30 45 tablet Sater, Charlie LABOR, MD [id number]      Dispensed Days Supply Quantity Provider Pharmacy  SOLIFENACIN  SUCCINATE  10 MG TABS 12/16/2023 30 30 tablet Sater, Charlie LABOR, MD [id number]    The requested medication was just filled, too soon to refill.

## 2024-01-18 ENCOUNTER — Other Ambulatory Visit: Payer: Self-pay | Admitting: Nurse Practitioner

## 2024-01-18 DIAGNOSIS — Z1231 Encounter for screening mammogram for malignant neoplasm of breast: Secondary | ICD-10-CM

## 2024-01-18 NOTE — Telephone Encounter (Signed)
 Pt called and LVM wanting to speak to the nurse regarding the referral that was sent out for her leg pain. Pt stated that they did not want to schedule her then they called her trying to schedule for the very next day and she is not able to do that since she has to relay on [place]. They have not called her back since then and she would like to discuss with nurse.

## 2024-01-21 NOTE — Telephone Encounter (Signed)
Patient has already scheduled appointment.

## 2024-01-25 DIAGNOSIS — R053 Chronic cough: Secondary | ICD-10-CM | POA: Diagnosis not present

## 2024-01-25 DIAGNOSIS — I10 Essential (primary) hypertension: Secondary | ICD-10-CM | POA: Diagnosis not present

## 2024-01-25 DIAGNOSIS — B372 Candidiasis of skin and nail: Secondary | ICD-10-CM | POA: Diagnosis not present

## 2024-02-08 ENCOUNTER — Telehealth: Payer: Self-pay | Admitting: Neurology

## 2024-02-08 NOTE — Telephone Encounter (Signed)
 Having cramping in legs that comes and goes for 2 to 3 months.Want to know if some medication already that can be change to help? Would like a call back.

## 2024-02-08 NOTE — Telephone Encounter (Addendum)
 Called pt and LVM (ok per DPR) with Dr Duncan office. Informed pt he does not want to make any changes to her medication. She is already on medication for spasticity. She is to call us  back with any further questions and let us  know if she wants to be referred to physical therapy. Left office number in message. Also sent mychart message.   Sater, Charlie LABOR, MD to Me (Selected Message)    02/08/24  4:44 PM She is already on medication to help spasticity.  We could refer to physical therapy if she would like.

## 2024-02-08 NOTE — Telephone Encounter (Signed)
 Reviewed chart. Pt reported leg cramps at March appt R>L. Pt is on Gabapentin , Requip , Trazodone .  Spoke with patient. She is not sure this is RLS. Pt states the leg cramps, depending on the day, are L>R. She can be sitting in her easy chair when they start. She feels the need to exercise/stretch the legs. Medication doesn't help. They occur in the latter part of the day and last until she goes to sleep but is not present in the AM.  She states her MS has gotten worse as she's gotten older and sometimes moving her legs, using the rolator is like an act of congress. She denies any recent falls. She isn't sure if Dr Vear has a medication he recommends for the cramps. I asked the patient and she confirmed the medications Dr Vear prescribes are all correct in dosage (Baclofen , Buspirone , Dalfampridine , Gabapentin , Ropinirole , Sertraline , Vesicare , Tramadol ). Of note, she is getting ready to schedule spine surgery. She said she tried an ESI but it only lasted 1 hour.

## 2024-02-09 ENCOUNTER — Other Ambulatory Visit: Payer: Self-pay | Admitting: Neurological Surgery

## 2024-02-09 NOTE — Telephone Encounter (Signed)
 Called pt to follow-up. She had not gotten my messages. I relayed the message from Dr Vear. Pt said she already has PT lined up because she is having spine surgery by Dr Joshua on 02/24/24. She thanked me for the call.

## 2024-02-11 ENCOUNTER — Ambulatory Visit

## 2024-02-12 ENCOUNTER — Encounter (HOSPITAL_COMMUNITY): Payer: Self-pay

## 2024-02-12 NOTE — Pre-Procedure Instructions (Signed)
 Surgical Instructions   Your procedure is scheduled on February 22, 2024. Report to [facility] Main Entrance A at 9:00 A.M., then check in with the Admitting office. Any questions or running late day of surgery: call [phone]  Questions prior to your surgery date: call [phone], Monday-Friday, 8am-4pm. If you experience any cold or flu symptoms such as cough, fever, chills, shortness of breath, etc. between now and your scheduled surgery, please notify us  at the above number.     Remember:  Do not eat or drink after midnight the night before your surgery    Take these medicines the morning of surgery with A SIP OF WATER: amLODipine  (NORVASC )  atorvastatin  (LIPITOR)  baclofen  (LIORESAL )  busPIRone  (BUSPAR )  cetirizine (ZYRTEC)  dalfampridine   esomeprazole (NEXIUM)  gabapentin  (NEURONTIN )  montelukast  (SINGULAIR )  rOPINIRole  (REQUIP )  sertraline  (ZOLOFT )  solifenacin  (VESICARE )  traMADol  (ULTRAM )    May take these medicines IF NEEDED: acetaminophen  (TYLENOL )  fluticasone  (FLONASE ) nasal spray  ondansetron  (ZOFRAN )    One week prior to surgery, STOP taking any Aspirin (unless otherwise instructed by your surgeon) Aleve, Naproxen, Ibuprofen , Motrin , Advil , Goody's, BC's, all herbal medications, fish oil, and non-prescription vitamins. This includes your medication: diclofenac Sodium (VOLTAREN) GEL                      Do NOT Smoke (Tobacco/Vaping) for 24 hours prior to your procedure.  If you use a CPAP at night, you may bring your mask/headgear for your overnight stay.   You will be asked to remove any contacts, glasses, piercing's, hearing aid's, dentures/partials prior to surgery. Please bring cases for these items if needed.    Patients discharged the day of surgery will not be allowed to drive home, and someone needs to stay with them for 24 hours.  SURGICAL WAITING ROOM VISITATION Patients may have no more than 2 support people in the waiting area - these  visitors may rotate.   Pre-op nurse will coordinate an appropriate time for 1 ADULT support person, who may not rotate, to accompany patient in pre-op.  Children under the age of 47 must have an adult with them who is not the patient and must remain in the main waiting area with an adult.  If the patient needs to stay at the hospital during part of their recovery, the visitor guidelines for inpatient rooms apply.  Please refer to the [facility] website for the visitor guidelines for any additional information.   If you received a COVID test during your pre-op visit  it is requested that you wear a mask when out in public, stay away from anyone that may not be feeling well and notify your surgeon if you develop symptoms. If you have been in contact with anyone that has tested positive in the last 10 days please notify you surgeon.      Pre-operative 4 CHG Bathing Instructions   You can play a key role in reducing the risk of infection after surgery. Your skin needs to be as free of germs as possible. You can reduce the number of germs on your skin by washing with CHG (chlorhexidine  gluconate) soap before surgery. CHG is an antiseptic soap that kills germs and continues to kill germs even after washing.   DO NOT use if you have an allergy to chlorhexidine /CHG or antibacterial soaps. If your skin becomes reddened or irritated, stop using the CHG and notify one of our RNs at [phone].   Please shower with  the CHG soap starting 4 days before surgery using the following schedule:     Please keep in mind the following:  DO NOT shave, including legs and underarms, starting the day of your first shower.   You may shave your face at any point before/day of surgery.  Place clean sheets on your bed the day you start using CHG soap. Use a clean washcloth (not used since being washed) for each shower. DO NOT sleep with pets once you start using the CHG.   CHG Shower Instructions:  Wash your  face and private area with normal soap. If you choose to wash your hair, wash first with your normal shampoo.  After you use shampoo/soap, rinse your hair and body thoroughly to remove shampoo/soap residue.  Turn the water OFF and apply  bottle of CHG soap to a CLEAN washcloth.  Apply CHG soap ONLY FROM YOUR NECK DOWN TO YOUR TOES (washing for 3-5 minutes)  DO NOT use CHG soap on face, private areas, open wounds, or sores.  Pay special attention to the area where your surgery is being performed.  If you are having back surgery, having someone wash your back for you may be helpful. Wait 2 minutes after CHG soap is applied, then you may rinse off the CHG soap.  Pat dry with a clean towel  Put on clean clothes/pajamas   If you choose to wear lotion, please use ONLY the CHG-compatible lotions that are listed below.  Additional instructions for the day of surgery:  If you choose, you may shower the morning of surgery with an antibacterial soap.  DO NOT APPLY any lotions, deodorants, cologne, or perfumes.   Do not bring valuables to the hospital. [facility] is not responsible for any belongings/valuables. Do not wear nail polish, gel polish, artificial nails, or any other type of covering on natural nails (fingers and toes) Do not wear jewelry or makeup Put on clean/comfortable clothes.  Please brush your teeth.  Ask your nurse before applying any prescription medications to the skin.     CHG Compatible Lotions   Aveeno Moisturizing lotion  Cetaphil Moisturizing Cream  Cetaphil Moisturizing Lotion  Clairol Herbal Essence Moisturizing Lotion, Dry Skin  Clairol Herbal Essence Moisturizing Lotion, Extra Dry Skin  Clairol Herbal Essence Moisturizing Lotion, Normal Skin  Curel Age Defying Therapeutic Moisturizing Lotion with Alpha Hydroxy  Curel Extreme Care Body Lotion  Curel Soothing Hands Moisturizing Hand Lotion  Curel Therapeutic Moisturizing Cream, Fragrance-Free  Curel Therapeutic  Moisturizing Lotion, Fragrance-Free  Curel Therapeutic Moisturizing Lotion, Original Formula  Eucerin Daily Replenishing Lotion  Eucerin Dry Skin Therapy Plus Alpha Hydroxy Crme  Eucerin Dry Skin Therapy Plus Alpha Hydroxy Lotion  Eucerin Original Crme  Eucerin Original Lotion  Eucerin Plus Crme Eucerin Plus Lotion  Eucerin TriLipid Replenishing Lotion  Keri Anti-Bacterial Hand Lotion  Keri Deep Conditioning Original Lotion Dry Skin Formula Softly Scented  Keri Deep Conditioning Original Lotion, Fragrance Free Sensitive Skin Formula  Keri Lotion Fast Absorbing Fragrance Free Sensitive Skin Formula  Keri Lotion Fast Absorbing Softly Scented Dry Skin Formula  Keri Original Lotion  Keri Skin Renewal Lotion Keri Silky Smooth Lotion  Keri Silky Smooth Sensitive Skin Lotion  Nivea Body Creamy Conditioning Oil  Nivea Body Extra Enriched Teacher, Adult Education Moisturizing Lotion Nivea Crme  Nivea Skin Firming Lotion  NutraDerm 30 Skin Lotion  NutraDerm Skin Lotion  NutraDerm Therapeutic Skin Cream  NutraDerm Therapeutic Skin Lotion  ProShield  Protective Hand Cream  Provon moisturizing lotion  Please read over the following fact sheets that you were given.

## 2024-02-15 ENCOUNTER — Encounter (HOSPITAL_COMMUNITY)
Admission: RE | Admit: 2024-02-15 | Discharge: 2024-02-15 | Disposition: A | Discharge status: Home / Self Care | Source: Ambulatory Visit | Attending: Neurological Surgery | Admitting: Neurological Surgery

## 2024-02-15 ENCOUNTER — Encounter (HOSPITAL_COMMUNITY): Payer: Self-pay

## 2024-02-15 ENCOUNTER — Other Ambulatory Visit: Payer: Self-pay

## 2024-02-15 VITALS — BP 144/68 | HR 60 | Temp 98.1°F | Resp 18 | Ht 62.0 in | Wt 177.8 lb

## 2024-02-15 DIAGNOSIS — G35D Multiple sclerosis, unspecified: Secondary | ICD-10-CM | POA: Diagnosis not present

## 2024-02-15 DIAGNOSIS — J849 Interstitial pulmonary disease, unspecified: Secondary | ICD-10-CM | POA: Diagnosis not present

## 2024-02-15 DIAGNOSIS — I10 Essential (primary) hypertension: Secondary | ICD-10-CM | POA: Diagnosis not present

## 2024-02-15 DIAGNOSIS — I2510 Atherosclerotic heart disease of native coronary artery without angina pectoris: Secondary | ICD-10-CM | POA: Insufficient documentation

## 2024-02-15 DIAGNOSIS — Z79899 Other long term (current) drug therapy: Secondary | ICD-10-CM | POA: Diagnosis not present

## 2024-02-15 DIAGNOSIS — J45909 Unspecified asthma, uncomplicated: Secondary | ICD-10-CM | POA: Diagnosis not present

## 2024-02-15 DIAGNOSIS — F419 Anxiety disorder, unspecified: Secondary | ICD-10-CM | POA: Insufficient documentation

## 2024-02-15 DIAGNOSIS — K449 Diaphragmatic hernia without obstruction or gangrene: Secondary | ICD-10-CM | POA: Diagnosis not present

## 2024-02-15 DIAGNOSIS — E669 Obesity, unspecified: Secondary | ICD-10-CM | POA: Diagnosis not present

## 2024-02-15 DIAGNOSIS — M797 Fibromyalgia: Secondary | ICD-10-CM | POA: Diagnosis not present

## 2024-02-15 DIAGNOSIS — G2581 Restless legs syndrome: Secondary | ICD-10-CM | POA: Diagnosis not present

## 2024-02-15 DIAGNOSIS — Z01818 Encounter for other preprocedural examination: Secondary | ICD-10-CM | POA: Insufficient documentation

## 2024-02-15 DIAGNOSIS — Z6832 Body mass index (BMI) 32.0-32.9, adult: Secondary | ICD-10-CM | POA: Diagnosis not present

## 2024-02-15 HISTORY — DX: Interstitial pulmonary disease, unspecified: J84.9

## 2024-02-15 HISTORY — DX: Urinary tract infection, site not specified: N39.0

## 2024-02-15 HISTORY — DX: Unspecified asthma, uncomplicated: J45.909

## 2024-02-15 LAB — BASIC METABOLIC PANEL WITH GFR
Anion gap: 11 (ref 5–15)
BUN: 13 mg/dL (ref 8–23)
CO2: 30 mmol/L (ref 22–32)
Calcium: 9 mg/dL (ref 8.9–10.3)
Chloride: 100 mmol/L (ref 98–111)
Creatinine, Ser: 0.76 mg/dL (ref 0.44–1.00)
GFR, Estimated: >60 mL/min (ref >60)
Glucose, Bld: 122 mg/dL — ABNORMAL HIGH (ref 70–99)
Potassium: 3.2 mmol/L — ABNORMAL LOW (ref 3.5–5.1)
Sodium: 141 mmol/L (ref 135–145)

## 2024-02-15 LAB — TYPE AND SCREEN
ABO/RH(D): O POS
Antibody Screen: NEGATIVE

## 2024-02-15 LAB — CBC
HCT: 37.7 % (ref 36.0–46.0)
Hemoglobin: 12.3 g/dL (ref 12.0–15.0)
MCH: 28.7 pg (ref 26.0–34.0)
MCHC: 32.6 g/dL (ref 30.0–36.0)
MCV: 87.9 fL (ref 80.0–100.0)
Platelets: 222 K/uL (ref 150–400)
RBC: 4.29 MIL/uL (ref 3.87–5.11)
RDW: 13.6 % (ref 11.5–15.5)
WBC: 4.6 K/uL (ref 4.0–10.5)
nRBC: 0 % (ref 0.0–0.2)

## 2024-02-15 LAB — SURGICAL PCR SCREEN
MRSA, PCR: NEGATIVE
Staphylococcus aureus: NEGATIVE

## 2024-02-15 NOTE — Progress Notes (Signed)
 PCP - Dr. Alberta Sharps Cardiologist - Pt saw Dr. Jerel Balding 08/17/2020 for palpitations. Echo completed and normal. PRN follow-up Neurologist - Dr. Charlie Crete - last office visit 06/01/2023 Pulmonologist - Dr. Jennet Epley - last office visit 08/11/2023  PPM/ICD - Denies Device Orders - n/a Rep Notified - n/a  Chest x-ray - n/a - CT Chest 09/01/2023 EKG - 02/15/2024 Stress Test - Denies ECHO - 09/14/2020 Cardiac Cath - Denies  Sleep Study - Denies CPAP - n/a  No DM  Last dose of GLP1 agonist- n/a GLP1 instructions: n/a  Blood Thinner Instructions: n/a Aspirin Instructions: n/a  NPO after midnight  COVID TEST- n/a   Anesthesia review: Yes. Hx of HTN, MS, ILD and pulmonary HTN (per CT scan on 09/01/2023)   Patient denies shortness of breath, fever, cough and chest pain at PAT appointment. Pt denies any respiratory illness/infection in the last two months.    All instructions explained to the patient, with a verbal understanding of the material. Patient agrees to go over the instructions while at home for a better understanding. Patient also instructed to self quarantine after being tested for COVID-19. The opportunity to ask questions was provided.

## 2024-02-16 NOTE — Anesthesia Preprocedure Evaluation (Signed)
 Anesthesia Evaluation    Airway        Dental   Pulmonary former smoker          Cardiovascular hypertension,      Neuro/Psych    GI/Hepatic   Endo/Other    Renal/GU      Musculoskeletal   Abdominal   Peds  Hematology   Anesthesia Other Findings   Reproductive/Obstetrics                              Anesthesia Physical Anesthesia Plan  ASA:   Anesthesia Plan:    Post-op Pain Management:    Induction:   PONV Risk Score and Plan:   Airway Management Planned:   Additional Equipment:   Intra-op Plan:   Post-operative Plan:   Informed Consent:   Plan Discussed with:   Anesthesia Plan Comments: (PAT note by Lynwood Hope, PA-C: 68 year old female with pertinent history including former smoker (quit 1998), interstitial lung disease, asthma, multiple sclerosis, HTN, GERD on PPI and H2 blocker, hiatal hernia, anxiety, fibromyalgia, restless leg syndrome, obesity BMI 32.  Follows with neurologist Dr. Vear for history of MS.  Last seen 06/01/2023.  Per note, She will remain off of a disease modifying therapy for MS (SPMS).   She has been stable.  We discussed the natural history of MS. Continue Dalfampridine  for MS gait.  She is also on gabapentin  and ropinirole  for RLS as well as trazodone  for insomnia.    Echocardiogram 08/2020 showed LVEF 60 to 65%, normal wall motion, normal RV, mild mitral regurgitation, mild aortic regurgitation.  Preop labs reviewed, mild hypokalemia potassium 3.2, otherwise WNL.  EKG 02/15/2024: Sinus rhythm with occasional PVCs.  Rate 64.  HRCT chest 09/01/2023: IMPRESSION: 1. Pulmonary parenchymal pattern of interstitial lung disease with air trapping, as detailed above, is suggestive fibrotic hypersensitivity pneumonitis. Findings are suggestive of an alternative diagnosis (not UIP) per consensus guidelines: Diagnosis of Idiopathic Pulmonary Fibrosis: An  Official ATS/ERS/JRS/ALAT Clinical Practice Guideline. Am JINNY Honey Crit Care Med Vol 198, Iss 5, [phone], Nov 22 2016. 2. Aortic atherosclerosis (ICD10-I70.0). Coronary artery calcification. 3. Enlarged pulmonic trunk, indicative of pulmonary arterial hypertension. 4. Emphysema (ICD10-J43.9).  TTE 09/14/2020: 1. Left ventricular ejection fraction, by estimation, is 60 to 65%. The  left ventricle has normal function. The left ventricle has no regional  wall motion abnormalities. Left ventricular diastolic parameters were  normal.   2. Right ventricular systolic function is normal. The right ventricular  size is normal.   3. The mitral valve is abnormal. Mild mitral valve regurgitation. No  evidence of mitral stenosis.   4. The aortic valve is normal in structure. There is mild calcification  of the aortic valve. Aortic valve regurgitation is mild. Mild aortic valve  sclerosis is present, with no evidence of aortic valve stenosis.   5. The inferior vena cava is normal in size with greater than 50%  respiratory variability, suggesting right atrial pressure of 3 mmHg.     )         Anesthesia Quick Evaluation

## 2024-02-16 NOTE — Progress Notes (Signed)
 Anesthesia Chart Review:  68 year old female with pertinent history including former smoker (quit 1998), interstitial lung disease, asthma, multiple sclerosis, HTN, GERD on PPI and H2 blocker, hiatal hernia, anxiety, fibromyalgia, restless leg syndrome, obesity BMI 32.  Follows with neurologist Dr. Vear for history of MS.  Last seen 06/01/2023.  Per note, She will remain off of a disease modifying therapy for MS (SPMS).   She has been stable.  We discussed the natural history of MS. Continue Dalfampridine  for MS gait.  She is also on gabapentin  and ropinirole  for RLS as well as trazodone  for insomnia.    Echocardiogram 08/2020 showed LVEF 60 to 65%, normal wall motion, normal RV, mild mitral regurgitation, mild aortic regurgitation.  Preop labs reviewed, mild hypokalemia potassium 3.2, otherwise WNL.  EKG 02/15/2024: Sinus rhythm with occasional PVCs.  Rate 64.  HRCT chest 09/01/2023: IMPRESSION: 1. Pulmonary parenchymal pattern of interstitial lung disease with air trapping, as detailed above, is suggestive fibrotic hypersensitivity pneumonitis. Findings are suggestive of an alternative diagnosis (not UIP) per consensus guidelines: Diagnosis of Idiopathic Pulmonary Fibrosis: An Official ATS/ERS/JRS/ALAT Clinical Practice Guideline. Am JINNY Honey Crit Care Med Vol 198, Iss 5, [phone], Nov 22 2016. 2. Aortic atherosclerosis (ICD10-I70.0). Coronary artery calcification. 3. Enlarged pulmonic trunk, indicative of pulmonary arterial hypertension. 4. Emphysema (ICD10-J43.9).  TTE 09/14/2020: 1. Left ventricular ejection fraction, by estimation, is 60 to 65%. The  left ventricle has normal function. The left ventricle has no regional  wall motion abnormalities. Left ventricular diastolic parameters were  normal.   2. Right ventricular systolic function is normal. The right ventricular  size is normal.   3. The mitral valve is abnormal. Mild mitral valve regurgitation. No  evidence of mitral  stenosis.   4. The aortic valve is normal in structure. There is mild calcification  of the aortic valve. Aortic valve regurgitation is mild. Mild aortic valve  sclerosis is present, with no evidence of aortic valve stenosis.   5. The inferior vena cava is normal in size with greater than 50%  respiratory variability, suggesting right atrial pressure of 3 mmHg.      Lynwood Geofm RIGGERS [facility] Short Stay Center/Anesthesiology Phone [phone] 02/16/2024 10:51 AM

## 2024-02-17 ENCOUNTER — Other Ambulatory Visit: Payer: Self-pay | Admitting: Neurology

## 2024-02-22 ENCOUNTER — Ambulatory Visit (HOSPITAL_COMMUNITY): Admission: RE | Admit: 2024-02-22 | Source: Home / Self Care | Admitting: Neurological Surgery

## 2024-02-22 ENCOUNTER — Encounter (HOSPITAL_COMMUNITY): Payer: Self-pay | Admitting: Physician Assistant

## 2024-02-22 ENCOUNTER — Encounter (HOSPITAL_COMMUNITY): Admission: RE | Payer: Self-pay | Source: Home / Self Care

## 2024-02-22 SURGERY — POSTERIOR LUMBAR FUSION 1 LEVEL
Anesthesia: General | Site: Back

## 2024-02-29 ENCOUNTER — Telehealth: Payer: Self-pay | Admitting: Neurology

## 2024-02-29 NOTE — Telephone Encounter (Signed)
 Pt called stating that she has had several falls and she is thinking she may need to move to the next step due to her balance not being good and put in a request for a wheel chair. Please advise.

## 2024-03-01 NOTE — Telephone Encounter (Signed)
 I called pt.  She noted that in the last 6-8 months (progressive leg weakness/ wobbly) noted falls, has not hurt herself.  She is not doing PT at this time.  She got last referral by her pcp.  She is asking for an appt.  I told her that  from last visit see annually. 05-31-2024 appt scheduled.  Placed on waitlist.  She is using her rollator.   She is not having any other symptoms, other then dry cough.  She will see her pcp for this but also will ask about referral to PT again for strengthening.  (Due to medicare guidelines will need recent appt. She will also when in for appt with us  to discuss scooter.  No DMT.   She appreciated call back.

## 2024-03-08 ENCOUNTER — Telehealth: Payer: Self-pay | Admitting: Neurology

## 2024-03-08 NOTE — Telephone Encounter (Signed)
 Patient wanted to make Dr. Vear aware have cancelled neurosurgery appointment due to my quality of life and age.

## 2024-03-21 ENCOUNTER — Ambulatory Visit: Admit: 2024-03-21 | Admitting: Neurological Surgery

## 2024-03-21 SURGERY — POSTERIOR LUMBAR FUSION 1 LEVEL
Anesthesia: General | Site: Back

## 2024-04-07 ENCOUNTER — Ambulatory Visit

## 2024-04-20 ENCOUNTER — Other Ambulatory Visit

## 2024-04-21 ENCOUNTER — Ambulatory Visit

## 2024-05-03 ENCOUNTER — Ambulatory Visit

## 2024-05-17 ENCOUNTER — Other Ambulatory Visit (HOSPITAL_BASED_OUTPATIENT_CLINIC_OR_DEPARTMENT_OTHER)

## 2024-05-31 ENCOUNTER — Ambulatory Visit: Admitting: Neurology
